# Patient Record
Sex: Female | Born: 1940 | Race: Black or African American | Hispanic: No | Smoking: Former smoker
Health system: Southern US, Community
[De-identification: ages and names within clinical notes are randomized; demographics above are authoritative.]

## PROBLEM LIST (undated history)

## (undated) DIAGNOSIS — Z961 Presence of intraocular lens: Secondary | ICD-10-CM

## (undated) DIAGNOSIS — M810 Age-related osteoporosis without current pathological fracture: Secondary | ICD-10-CM

## (undated) DIAGNOSIS — E079 Disorder of thyroid, unspecified: Secondary | ICD-10-CM

## (undated) DIAGNOSIS — I10 Essential (primary) hypertension: Secondary | ICD-10-CM

## (undated) DIAGNOSIS — E042 Nontoxic multinodular goiter: Secondary | ICD-10-CM

## (undated) DIAGNOSIS — C439 Malignant melanoma of skin, unspecified: Secondary | ICD-10-CM

## (undated) DIAGNOSIS — T8859XA Other complications of anesthesia, initial encounter: Secondary | ICD-10-CM

## (undated) DIAGNOSIS — C8400 Mycosis fungoides, unspecified site: Secondary | ICD-10-CM

## (undated) DIAGNOSIS — T4145XA Adverse effect of unspecified anesthetic, initial encounter: Secondary | ICD-10-CM

## (undated) DIAGNOSIS — C8190 Hodgkin lymphoma, unspecified, unspecified site: Secondary | ICD-10-CM

## (undated) DIAGNOSIS — E119 Type 2 diabetes mellitus without complications: Secondary | ICD-10-CM

## (undated) HISTORY — DX: Nontoxic multinodular goiter: E04.2

## (undated) HISTORY — DX: Hodgkin lymphoma, unspecified, unspecified site: C81.90

## (undated) HISTORY — PX: EYE SURGERY: SHX253

## (undated) HISTORY — DX: Age-related osteoporosis without current pathological fracture: M81.0

## (undated) HISTORY — DX: Presence of intraocular lens: Z96.1

## (undated) HISTORY — PX: BREAST EXCISIONAL BIOPSY: SUR124

## (undated) HISTORY — DX: Malignant melanoma of skin, unspecified: C43.9

## (undated) HISTORY — PX: COLONOSCOPY: SHX174

## (undated) HISTORY — DX: Mycosis fungoides, unspecified site: C84.00

---

## 2012-02-14 DIAGNOSIS — M810 Age-related osteoporosis without current pathological fracture: Secondary | ICD-10-CM | POA: Insufficient documentation

## 2012-02-14 DIAGNOSIS — E1169 Type 2 diabetes mellitus with other specified complication: Secondary | ICD-10-CM | POA: Insufficient documentation

## 2012-03-13 DIAGNOSIS — Z780 Asymptomatic menopausal state: Secondary | ICD-10-CM | POA: Insufficient documentation

## 2013-09-08 LAB — HM DEXA SCAN

## 2014-12-17 ENCOUNTER — Emergency Department (HOSPITAL_COMMUNITY): Payer: Medicare Other

## 2014-12-17 ENCOUNTER — Emergency Department (HOSPITAL_COMMUNITY)
Admission: EM | Admit: 2014-12-17 | Discharge: 2014-12-17 | Disposition: A | Discharge status: Home / Self Care | Payer: Medicare Other | Attending: Emergency Medicine | Admitting: Emergency Medicine

## 2014-12-17 ENCOUNTER — Encounter (HOSPITAL_COMMUNITY): Payer: Self-pay | Admitting: Emergency Medicine

## 2014-12-17 DIAGNOSIS — S42402A Unspecified fracture of lower end of left humerus, initial encounter for closed fracture: Secondary | ICD-10-CM | POA: Insufficient documentation

## 2014-12-17 DIAGNOSIS — Z79899 Other long term (current) drug therapy: Secondary | ICD-10-CM | POA: Diagnosis not present

## 2014-12-17 DIAGNOSIS — Z23 Encounter for immunization: Secondary | ICD-10-CM | POA: Insufficient documentation

## 2014-12-17 DIAGNOSIS — E119 Type 2 diabetes mellitus without complications: Secondary | ICD-10-CM | POA: Insufficient documentation

## 2014-12-17 DIAGNOSIS — Y998 Other external cause status: Secondary | ICD-10-CM | POA: Diagnosis not present

## 2014-12-17 DIAGNOSIS — W19XXXA Unspecified fall, initial encounter: Secondary | ICD-10-CM

## 2014-12-17 DIAGNOSIS — Y9289 Other specified places as the place of occurrence of the external cause: Secondary | ICD-10-CM | POA: Diagnosis not present

## 2014-12-17 DIAGNOSIS — W11XXXA Fall on and from ladder, initial encounter: Secondary | ICD-10-CM | POA: Insufficient documentation

## 2014-12-17 DIAGNOSIS — Y9389 Activity, other specified: Secondary | ICD-10-CM | POA: Diagnosis not present

## 2014-12-17 DIAGNOSIS — I10 Essential (primary) hypertension: Secondary | ICD-10-CM | POA: Insufficient documentation

## 2014-12-17 DIAGNOSIS — S4991XA Unspecified injury of right shoulder and upper arm, initial encounter: Secondary | ICD-10-CM | POA: Diagnosis present

## 2014-12-17 DIAGNOSIS — Z7982 Long term (current) use of aspirin: Secondary | ICD-10-CM | POA: Diagnosis not present

## 2014-12-17 DIAGNOSIS — S5290XA Unspecified fracture of unspecified forearm, initial encounter for closed fracture: Secondary | ICD-10-CM

## 2014-12-17 DIAGNOSIS — S42301A Unspecified fracture of shaft of humerus, right arm, initial encounter for closed fracture: Secondary | ICD-10-CM

## 2014-12-17 HISTORY — DX: Essential (primary) hypertension: I10

## 2014-12-17 HISTORY — DX: Type 2 diabetes mellitus without complications: E11.9

## 2014-12-17 HISTORY — DX: Disorder of thyroid, unspecified: E07.9

## 2014-12-17 LAB — BASIC METABOLIC PANEL
ANION GAP: 11 (ref 5–15)
BUN: 14 mg/dL (ref 6–20)
CALCIUM: 9.1 mg/dL (ref 8.9–10.3)
CHLORIDE: 108 mmol/L (ref 101–111)
CO2: 21 mmol/L — ABNORMAL LOW (ref 22–32)
Creatinine, Ser: 0.82 mg/dL (ref 0.44–1.00)
GFR calc non Af Amer: >60 mL/min (ref >60)
Glucose, Bld: 178 mg/dL — ABNORMAL HIGH (ref 65–99)
Potassium: 3.4 mmol/L — ABNORMAL LOW (ref 3.5–5.1)
SODIUM: 140 mmol/L (ref 135–145)

## 2014-12-17 LAB — CBC
HCT: 38.9 % (ref 36.0–46.0)
HEMOGLOBIN: 12.6 g/dL (ref 12.0–15.0)
MCH: 27.6 pg (ref 26.0–34.0)
MCHC: 32.4 g/dL (ref 30.0–36.0)
MCV: 85.3 fL (ref 78.0–100.0)
PLATELETS: 209 10*3/uL (ref 150–400)
RBC: 4.56 MIL/uL (ref 3.87–5.11)
RDW: 13 % (ref 11.5–15.5)
WBC: 6.9 10*3/uL (ref 4.0–10.5)

## 2014-12-17 MED ORDER — CEFAZOLIN SODIUM 1-5 GM-% IV SOLN
1.0000 g | Freq: Once | INTRAVENOUS | Status: AC
  Administered 2014-12-17: 1 g via INTRAVENOUS
  Filled 2014-12-17: qty 50

## 2014-12-17 MED ORDER — HYDROMORPHONE HCL 1 MG/ML IJ SOLN
1.0000 mg | Freq: Once | INTRAMUSCULAR | Status: AC
  Administered 2014-12-17: 1 mg via INTRAVENOUS
  Filled 2014-12-17: qty 1

## 2014-12-17 MED ORDER — HYDROCODONE-ACETAMINOPHEN 5-325 MG PO TABS: 1.0000 | ORAL_TABLET | ORAL | Status: DC | PRN

## 2014-12-17 MED ORDER — TETANUS-DIPHTH-ACELL PERTUSSIS 5-2.5-18.5 LF-MCG/0.5 IM SUSP
0.5000 mL | Freq: Once | INTRAMUSCULAR | Status: AC
  Administered 2014-12-17: 0.5 mL via INTRAMUSCULAR
  Filled 2014-12-17: qty 0.5

## 2014-12-17 MED ORDER — ONDANSETRON 4 MG PO TBDP
4.0000 mg | ORAL_TABLET | Freq: Once | ORAL | Status: AC
  Administered 2014-12-17: 4 mg via ORAL
  Filled 2014-12-17: qty 1

## 2014-12-17 MED ORDER — ONDANSETRON HCL 4 MG PO TABS: 4.0000 mg | ORAL_TABLET | Freq: Four times a day (QID) | ORAL | Status: DC | PRN

## 2014-12-17 MED ORDER — ONDANSETRON HCL 4 MG/2ML IJ SOLN
4.0000 mg | Freq: Once | INTRAMUSCULAR | Status: AC
  Administered 2014-12-17: 4 mg via INTRAVENOUS
  Filled 2014-12-17: qty 2

## 2014-12-17 MED ORDER — CEPHALEXIN 500 MG PO CAPS: 500.0000 mg | ORAL_CAPSULE | Freq: Four times a day (QID) | ORAL | Status: DC

## 2014-12-17 MED ORDER — LIDOCAINE HCL (PF) 1 % IJ SOLN
30.0000 mL | Freq: Once | INTRAMUSCULAR | Status: AC
  Administered 2014-12-17: 30 mL via INTRADERMAL
  Filled 2014-12-17: qty 30

## 2014-12-17 MED ORDER — HYDROMORPHONE HCL 1 MG/ML IJ SOLN
1.0000 mg | Freq: Once | INTRAMUSCULAR | Status: AC
  Administered 2014-12-17: 1 mg via INTRAMUSCULAR
  Filled 2014-12-17: qty 1

## 2014-12-17 MED ORDER — ONDANSETRON HCL 4 MG/2ML IJ SOLN
4.0000 mg | Freq: Once | INTRAMUSCULAR | Status: AC
Start: 2014-12-17 — End: 2014-12-17
  Administered 2014-12-17: 4 mg via INTRAVENOUS
  Filled 2014-12-17: qty 2

## 2014-12-17 NOTE — ED Notes (Signed)
Dr. Blackman at bedside. 

## 2014-12-17 NOTE — ED Notes (Signed)
Ortho tech paged and OTW

## 2014-12-17 NOTE — ED Notes (Signed)
Family at bedside. Pt continues to be comfortable; no pain

## 2014-12-17 NOTE — ED Notes (Signed)
Pt fell from 10 ft ladder; no LOC; open humerus fx. Witnessed by family; no thinners.

## 2014-12-17 NOTE — ED Notes (Signed)
Pt's splint has blood saturated around elbow. Splint removed and stitched placed by EDP. Ortho called for re splinting.

## 2014-12-17 NOTE — Progress Notes (Signed)
   12/17/14 1000  Clinical Encounter Type  Visited With Health care provider  Visit Type Initial;ED;Trauma   Chaplain was paged to a level 2 trauma page 9:23 PM. Patient sustained injury after falling off of a latter. Chaplain spoke to patient's nurse. Patient's nurse said that family witnessed the fall and would likely be on their way to the hospital. No further chaplain support needed at this time. Page On-call chaplain if patient's family needs support once they arrive.  Ayzia Day, Claudius Sis, Chaplain  10:06 AM

## 2014-12-17 NOTE — ED Notes (Addendum)
Pt's arm is being splinted and pain is intense. Pt vomiting; orders obtained.

## 2014-12-17 NOTE — ED Notes (Signed)
PT ambulated with baseline gait; VSS; A&Ox3; no signs of distress; respirations even and unlabored; skin warm and dry; no questions upon discharge.  

## 2014-12-17 NOTE — ED Provider Notes (Signed)
CSN: [id number]     Arrival date & time 12/17/14  0254 History   First MD Initiated Contact with Patient 12/17/14 [phone]     Chief Complaint  Patient presents with  . Fall     (Consider location/radiation/quality/duration/timing/severity/associated sxs/prior Treatment) HPI Comments: 74 year old female fall 6 feet from ladder landing on right arm. Now complaining of pain to right arm. Has no other complaints. Pain localized to the right upper arm. Sharp stabbing pain. Minimal pain on arrival. Is status Kaylynn Chamblin 100 mcg of fentanyl. No numbness or tingling  Patient is a 74 y.o. female presenting with fall.  Fall This is a new problem. The current episode started today. The problem has been unchanged. Pertinent negatives include no abdominal pain, chest pain, congestion, fever, headaches or rash. Associated symptoms comments: Arm pain . Exacerbated by: movement. She has tried nothing for the symptoms.    Past Medical History  Diagnosis Date  . Hypertension   . Thyroid disease   . Diabetes mellitus without complication    No past surgical history on file. No family history on file. History  Substance Use Topics  . Smoking status: Not on file  . Smokeless tobacco: Not on file  . Alcohol Use: Not on file   OB History    No data available     Review of Systems  Constitutional: Negative for fever.  HENT: Negative for congestion.   Respiratory: Negative for shortness of breath.   Cardiovascular: Negative for chest pain.  Gastrointestinal: Negative for abdominal pain.  Musculoskeletal:       R arm pain   Skin: Negative for rash.  Neurological: Negative for headaches.  Psychiatric/Behavioral: Negative for confusion.  All other systems reviewed and are negative.     Allergies  Review of patient's allergies indicates no known allergies.  Home Medications   Prior to Admission medications   Medication Sig Start Date End Date Taking? Authorizing Provider  aspirin 81 MG tablet  Take 81 mg by mouth daily.   Yes Historical Provider, MD  calcium-vitamin D (OSCAL WITH D) 500-200 MG-UNIT per tablet Take 1 tablet by mouth daily with breakfast.   Yes Historical Provider, MD  HYDROcodone-acetaminophen (NORCO/VICODIN) 5-325 MG per tablet Take 1-2 tablets by mouth every 4 (four) hours as needed. 12/17/14   Robynn Pane, MD  losartan-hydrochlorothiazide (HYZAAR) 50-12.5 MG per tablet Take 1 tablet by mouth daily.   Yes Historical Provider, MD  ondansetron (ZOFRAN) 4 MG tablet Take 1 tablet (4 mg total) by mouth every 6 (six) hours as needed for nausea or vomiting. 12/17/14   Robynn Pane, MD  simvastatin (ZOCOR) 10 MG tablet Take 10 mg by mouth daily.   Yes Historical Provider, MD   BP 117/70 mmHg  Pulse 83  Temp(Src) 97.9 F (36.6 C)  Resp 22  SpO2 97% Physical Exam  Constitutional: She appears well-developed.  HENT:  Head: Normocephalic and atraumatic.  Eyes: Pupils are equal, round, and reactive to light.  Neck:  No midline ttp   Cardiovascular: Normal rate and intact distal pulses.   No murmur heard. Pulmonary/Chest: Effort normal. No respiratory distress.  Abdominal: Soft. She exhibits no distension. There is no tenderness.  Musculoskeletal: She exhibits tenderness.  Right humerus with crepitus and deformity. Neurovascularly intact distally. Small laceration on the posterior side of the humerus. Questionable open fracture  Neurological: She is alert. No cranial nerve deficit. She exhibits normal muscle tone.  Skin: Skin is warm.  Psychiatric: She has a normal mood and affect.  Vitals reviewed.   ED Course  LACERATION REPAIR Date/Time: 12/17/2014 1:57 PM Performed by: Naliyah Neth Authorized by: Jaquarius Seder Consent: Verbal consent obtained. Consent given by: patient Laceration length: 0.5 cm Foreign bodies: no foreign bodies Anesthesia: local infiltration Local anesthetic: lidocaine 1% without epinephrine Anesthetic total: 3 ml Irrigation solution:  saline Skin closure: 4-0 nylon Number of sutures: 1 Technique: simple Approximation: close Approximation difficulty: simple Patient tolerance: Patient tolerated the procedure well with no immediate complications   (including critical care time) Labs Review Labs Reviewed  BASIC METABOLIC PANEL - Abnormal; Notable for the following:    Potassium 3.4 (*)    CO2 21 (*)    Glucose, Bld 178 (*)    All other components within normal limits  CBC    Imaging Review Dg Elbow 2 Views Right  12/17/2014   CLINICAL DATA:  Fell 10 feet from a ladder.  Postreduction films.  EXAM: RIGHT ELBOW - 2 VIEW; RIGHT FOREARM - 2 VIEW  COMPARISON:  Earlier films, same date.  FINDINGS: Again demonstrated is a severely comminuted fracture of the distal humerus along with a radial neck fracture and small avulsion fracture involving the coronoid process. No forearm fractures.  IMPRESSION: Severely comminuted elbow fractures appear stable.   Electronically Signed   By: Marijo Sanes M.D.   On: 12/17/2014 12:29   Dg Forearm Right  12/17/2014   CLINICAL DATA:  [place] 10 feet from a ladder.  Postreduction films.  EXAM: RIGHT ELBOW - 2 VIEW; RIGHT FOREARM - 2 VIEW  COMPARISON:  Earlier films, same date.  FINDINGS: Again demonstrated is a severely comminuted fracture of the distal humerus along with a radial neck fracture and small avulsion fracture involving the coronoid process. No forearm fractures.  IMPRESSION: Severely comminuted elbow fractures appear stable.   Electronically Signed   By: Marijo Sanes M.D.   On: 12/17/2014 12:29   Ct Head Wo Contrast  12/17/2014   CLINICAL DATA:  Fall from ladder, open humerus fracture  EXAM: CT HEAD WITHOUT CONTRAST  CT CERVICAL SPINE WITHOUT CONTRAST  TECHNIQUE: Multidetector CT imaging of the head and cervical spine was performed following the standard protocol without intravenous contrast. Multiplanar CT image reconstructions of the cervical spine were also generated.  COMPARISON:   None.  FINDINGS: CT HEAD FINDINGS  No skull fracture is noted. No intracranial hemorrhage, mass effect or midline shift. Paranasal sinuses and mastoid air cells are unremarkable.  No acute cortical infarction. No mass lesion is noted on this unenhanced scan. Atherosclerotic calcifications of carotid siphon. Mild cerebral atrophy. Mild periventricular white matter decreased attenuation probable due to chronic small vessel ischemic changes.  CT CERVICAL SPINE FINDINGS  Axial images of the cervical spine shows no acute fracture or subluxation. Computer processed images shows no acute fracture or subluxation. Degenerative changes C1-C2 articulation. There is disc space flattening with mild anterior spurring at C5-C6 and C6-C7 level. Mild posterior spurring at C6-C7 level. No prevertebral soft tissue swelling. Cervical airway is patent. Spinal canal is patent. There is no pneumothorax in visualized lung apices.  IMPRESSION: 1. No acute intracranial abnormality.  Mild cerebral atrophy. 2. No cervical spine acute fracture or subluxation. Mild degenerative changes as described above. There is no pneumothorax in visualized lung apices.   Electronically Signed   By: Lahoma Crocker M.D.   On: 12/17/2014 12:32   Ct Cervical Spine Wo Contrast  12/17/2014   CLINICAL DATA:  Fall from ladder, open humerus fracture  EXAM: CT HEAD WITHOUT  CONTRAST  CT CERVICAL SPINE WITHOUT CONTRAST  TECHNIQUE: Multidetector CT imaging of the head and cervical spine was performed following the standard protocol without intravenous contrast. Multiplanar CT image reconstructions of the cervical spine were also generated.  COMPARISON:  None.  FINDINGS: CT HEAD FINDINGS  No skull fracture is noted. No intracranial hemorrhage, mass effect or midline shift. Paranasal sinuses and mastoid air cells are unremarkable.  No acute cortical infarction. No mass lesion is noted on this unenhanced scan. Atherosclerotic calcifications of carotid siphon. Mild cerebral  atrophy. Mild periventricular white matter decreased attenuation probable due to chronic small vessel ischemic changes.  CT CERVICAL SPINE FINDINGS  Axial images of the cervical spine shows no acute fracture or subluxation. Computer processed images shows no acute fracture or subluxation. Degenerative changes C1-C2 articulation. There is disc space flattening with mild anterior spurring at C5-C6 and C6-C7 level. Mild posterior spurring at C6-C7 level. No prevertebral soft tissue swelling. Cervical airway is patent. Spinal canal is patent. There is no pneumothorax in visualized lung apices.  IMPRESSION: 1. No acute intracranial abnormality.  Mild cerebral atrophy. 2. No cervical spine acute fracture or subluxation. Mild degenerative changes as described above. There is no pneumothorax in visualized lung apices.   Electronically Signed   By: Lahoma Crocker M.D.   On: 12/17/2014 12:32   Dg Pelvis Portable  12/17/2014   CLINICAL DATA:  Fall from 10 foot ladder.  EXAM: PORTABLE PELVIS 1-2 VIEWS  COMPARISON:  None.  FINDINGS: Degenerative changes are noted in the lower lumbar spine and at the SI joints bilaterally. No acute fracture is present.  IMPRESSION: Degenerative changes of the lower lumbar spine and pelvis without fracture.   Electronically Signed   By: San Morelle M.D.   On: 12/17/2014 10:19   Ct Elbow Right W/o Cm  12/17/2014   CLINICAL DATA:  Status Kera Deacon 10 foot fall from a ladder with a right elbow fracture. Initial encounter.  EXAM: CT OF THE RIGHT ELBOW WITHOUT CONTRAST  TECHNIQUE: Multidetector CT imaging was performed according to the standard protocol. Multiplanar CT image reconstructions were also generated.  COMPARISON:  Plain films of the right elbow this same day.  FINDINGS: There is a large volume of gas in the soft tissues about the elbow. As seen on the comparison plain films, the patient has a comminuted fracture of the distal humerus. The fracture is predominantly in the shape of a T  with a transverse component through the junction of the metaphysis and diaphysis. The fracture extends inferiorly through the trochlea where there is distraction of approximately 0.5 cm of the articular surface. The medial fracture fragment, which is composed of the medial condyle, is medially displaced approximately 1.7 cm at its superior margin and superiorly displaced 1.0 cm. The lateral condyle is distracted approximately 0.9 cm inferiorly from the diaphysis of the humerus. Medullary bone of the metaphysis of the lateral condyle is highly comminuted. A nondisplaced component of the fracture extends through the lateral epicondyle. A fracture fragment measuring 0.7 cm AP x 1.2 cm craniocaudal x 0.9 cm craniocaudal is displaced into the lateral aspect of the olecranon fossa and rotated medially 180 degrees posteriorly.  The patient also has a fracture of the radial neck which is impacted approximately 0.2 cm and demonstrates minimal anterior displacement. The fracture does not appear to involve the articular surface of the radius. There is also a chip fracture of the medial aspect of the ulna at the sublime tubercle most consistent with avulsion  at the attachment site of the ulnar collateral ligament. No other fracture is identified.  IMPRESSION: Comminuted intra-articular fracture of the distal humerus as described most consistent with an AO type C2 injury.  Mildly displaced fracture of the radial neck does not appear to involve the articular surface of the radius.  Small avulsion fracture of the medial aspect of the ulna is most consistent with avulsion at the ulnar collateral ligament attachment site.  Extensive soft tissue gas worrisome for open fracture.   Electronically Signed   By: Inge Rise M.D.   On: 12/17/2014 13:05   Dg Chest Portable 1 View  12/17/2014   CLINICAL DATA:  Recent fall from ladder  EXAM: PORTABLE CHEST - 1 VIEW  COMPARISON:  None.  FINDINGS: The heart size and mediastinal contours  are within normal limits. Both lungs are clear. The visualized skeletal structures are unremarkable.  IMPRESSION: No active disease.   Electronically Signed   By: Inez Catalina M.D.   On: 12/17/2014 10:18   Dg Humerus Right  12/17/2014   CLINICAL DATA:  Fall 10 feet from ladder with open humeral fracture  EXAM: RIGHT HUMERUS - 2+ VIEW  COMPARISON:  None.  FINDINGS: There is a comminuted fracture of the distal right humerus involving the elbow joint with impaction at the fracture site. Fracture through the radial neck is noted as well with some impaction there is also deformity of the proximal humerus consistent with a impacted surgical neck fracture. Shoulder films may be helpful for further evaluation.  IMPRESSION: Comminuted distal humeral fracture involving the elbow joint with associated radial neck fracture. Impaction at the fracture site is noted.  Irregularity of the proximal humerus consistent with a surgical neck fracture with impaction.   Electronically Signed   By: Inez Catalina M.D.   On: 12/17/2014 10:17     EKG Interpretation None      MDM  74 year old female status Stehanie Ekstrom fall from 5-6 feet. On arrival patient complaining only of pain in the right distal humerus. There is no other complaints of pain on thorough examination. Patient denies striking head. Not on blood thinners. Patient is neurovascularly intact distal to this obvious deformity. Initially basic labs and x-rays were obtained showed a severely comminuted fracture of the distal humerus. Orthopedics was consult. Seen patient in the ED. We'll plan for outpatient management. CT was performed at the request. While in the ED patient also had nausea vomiting require multiple doses of Zofran. This was likely secondary to narcotic pain medication. However given fall and age head and C-spine CTs were obtained, despite the fact patient complained of no headache neck pain, no loss of consciousness and minimal suspicion for head injury. CT is  normal. No indication for further imaging or treatment at this time. Per orthopedics this will be repaired as an outpatient. Was given pain medication as well as Zofran. Discussed return precautions and patient was discharged  1:58 PM Prior discharge patient was having right arm splint redressed. Noted to have moderate amount of blood. Splint was taken down. Patient having bleeding from very small 0.5 cm stellate-like laceration. Same is noted in physical exam section above. No arterial bleed. Slight venous bleeding. One suture was used to approximate the edges. However given the stellate laceration difficult to approximate. Surgicel was then placed. Wound was redressed. Sling and splint were reapplied. Discussed bleeding precautions. Orthopedics is aware of this wound. They recommended Keflex for infection prophylaxis prior to outpatient surgical management in 4 days. Discussed  return precautions with patient and family who voiced understanding.   Final diagnoses:  Fall  Radius fracture  Humeral fracture, right, closed, initial encounter          Robynn Pane, MD 12/17/14 [place], MD 12/17/14 [phone]

## 2014-12-17 NOTE — ED Notes (Signed)
Pt returned and back on monitor; coffee given to family.

## 2014-12-17 NOTE — ED Notes (Signed)
Patient is resting comfortably; family at bedside; denies pain and nausea.

## 2014-12-18 ENCOUNTER — Other Ambulatory Visit (HOSPITAL_COMMUNITY): Payer: Self-pay | Admitting: Orthopaedic Surgery

## 2014-12-18 ENCOUNTER — Encounter (HOSPITAL_COMMUNITY): Payer: Self-pay | Admitting: *Deleted

## 2014-12-18 ENCOUNTER — Encounter (HOSPITAL_COMMUNITY): Payer: Self-pay | Admitting: Pharmacy Technician

## 2014-12-21 MED ORDER — CEFAZOLIN SODIUM-DEXTROSE 2-3 GM-% IV SOLR
2.0000 g | INTRAVENOUS | Status: AC
  Administered 2014-12-22: 2 g via INTRAVENOUS
  Filled 2014-12-21: qty 50

## 2014-12-22 ENCOUNTER — Encounter (HOSPITAL_COMMUNITY)
Admission: RE | Disposition: A | Discharge status: Home / Self Care | Payer: Self-pay | Source: Ambulatory Visit | Attending: Orthopaedic Surgery

## 2014-12-22 ENCOUNTER — Ambulatory Visit (HOSPITAL_COMMUNITY)
Admission: RE | Admit: 2014-12-22 | Discharge: 2014-12-23 | Disposition: A | Discharge status: Home / Self Care | Payer: Medicare Other | Source: Ambulatory Visit | Attending: Orthopaedic Surgery | Admitting: Orthopaedic Surgery

## 2014-12-22 ENCOUNTER — Encounter (HOSPITAL_COMMUNITY): Payer: Self-pay | Admitting: *Deleted

## 2014-12-22 ENCOUNTER — Other Ambulatory Visit: Payer: Self-pay

## 2014-12-22 ENCOUNTER — Observation Stay (HOSPITAL_COMMUNITY): Payer: Medicare Other

## 2014-12-22 ENCOUNTER — Inpatient Hospital Stay (HOSPITAL_COMMUNITY): Payer: Medicare Other | Admitting: Anesthesiology

## 2014-12-22 ENCOUNTER — Inpatient Hospital Stay (HOSPITAL_COMMUNITY): Payer: Medicare Other

## 2014-12-22 DIAGNOSIS — S42411A Displaced simple supracondylar fracture without intercondylar fracture of right humerus, initial encounter for closed fracture: Secondary | ICD-10-CM

## 2014-12-22 DIAGNOSIS — I10 Essential (primary) hypertension: Secondary | ICD-10-CM | POA: Insufficient documentation

## 2014-12-22 DIAGNOSIS — S52124A Nondisplaced fracture of head of right radius, initial encounter for closed fracture: Secondary | ICD-10-CM | POA: Diagnosis not present

## 2014-12-22 DIAGNOSIS — Z7982 Long term (current) use of aspirin: Secondary | ICD-10-CM | POA: Insufficient documentation

## 2014-12-22 DIAGNOSIS — W11XXXA Fall on and from ladder, initial encounter: Secondary | ICD-10-CM | POA: Diagnosis not present

## 2014-12-22 DIAGNOSIS — Z87891 Personal history of nicotine dependence: Secondary | ICD-10-CM | POA: Diagnosis not present

## 2014-12-22 DIAGNOSIS — E119 Type 2 diabetes mellitus without complications: Secondary | ICD-10-CM | POA: Insufficient documentation

## 2014-12-22 DIAGNOSIS — Z419 Encounter for procedure for purposes other than remedying health state, unspecified: Secondary | ICD-10-CM

## 2014-12-22 DIAGNOSIS — S42201A Unspecified fracture of upper end of right humerus, initial encounter for closed fracture: Secondary | ICD-10-CM

## 2014-12-22 DIAGNOSIS — S42421A Displaced comminuted supracondylar fracture without intercondylar fracture of right humerus, initial encounter for closed fracture: Secondary | ICD-10-CM | POA: Diagnosis not present

## 2014-12-22 DIAGNOSIS — E079 Disorder of thyroid, unspecified: Secondary | ICD-10-CM | POA: Insufficient documentation

## 2014-12-22 HISTORY — PX: ORIF HUMERUS FRACTURE: SHX2126

## 2014-12-22 HISTORY — DX: Adverse effect of unspecified anesthetic, initial encounter: T41.45XA

## 2014-12-22 HISTORY — DX: Displaced simple supracondylar fracture without intercondylar fracture of right humerus, initial encounter for closed fracture: S42.411A

## 2014-12-22 HISTORY — DX: Other complications of anesthesia, initial encounter: T88.59XA

## 2014-12-22 LAB — GLUCOSE, CAPILLARY
GLUCOSE-CAPILLARY: 231 mg/dL — AB (ref 65–99)
Glucose-Capillary: 177 mg/dL — ABNORMAL HIGH (ref 65–99)

## 2014-12-22 SURGERY — OPEN REDUCTION INTERNAL FIXATION (ORIF) DISTAL HUMERUS FRACTURE
Anesthesia: Regional | Site: Arm Upper | Laterality: Right

## 2014-12-22 MED ORDER — ASPIRIN EC 81 MG PO TBEC
81.0000 mg | DELAYED_RELEASE_TABLET | Freq: Every day | ORAL | Status: DC
  Administered 2014-12-23: 81 mg via ORAL
  Filled 2014-12-22 (×2): qty 1

## 2014-12-22 MED ORDER — DIPHENHYDRAMINE HCL 12.5 MG/5ML PO ELIX: 12.5000 mg | ORAL_SOLUTION | ORAL | Status: DC | PRN

## 2014-12-22 MED ORDER — BUPIVACAINE-EPINEPHRINE (PF) 0.5% -1:200000 IJ SOLN
INTRAMUSCULAR | Status: DC | PRN
  Administered 2014-12-22: 30 mL via PERINEURAL

## 2014-12-22 MED ORDER — LOSARTAN POTASSIUM 50 MG PO TABS
50.0000 mg | ORAL_TABLET | Freq: Every day | ORAL | Status: DC
  Administered 2014-12-23: 50 mg via ORAL
  Filled 2014-12-22: qty 1

## 2014-12-22 MED ORDER — MIDAZOLAM HCL 2 MG/2ML IJ SOLN
INTRAMUSCULAR | Status: AC
  Administered 2014-12-22: 1 mg via INTRAVENOUS
  Filled 2014-12-22: qty 2

## 2014-12-22 MED ORDER — DEXTROSE 5 % IV SOLN
500.0000 mg | Freq: Four times a day (QID) | INTRAVENOUS | Status: DC | PRN
  Filled 2014-12-22: qty 5

## 2014-12-22 MED ORDER — METHOCARBAMOL 500 MG PO TABS
500.0000 mg | ORAL_TABLET | Freq: Four times a day (QID) | ORAL | Status: DC | PRN
  Administered 2014-12-22 – 2014-12-23 (×3): 500 mg via ORAL
  Filled 2014-12-22 (×4): qty 1

## 2014-12-22 MED ORDER — FENTANYL CITRATE (PF) 100 MCG/2ML IJ SOLN
INTRAMUSCULAR | Status: AC
  Administered 2014-12-22: 50 ug via INTRAVENOUS
  Filled 2014-12-22: qty 2

## 2014-12-22 MED ORDER — PROPOFOL 10 MG/ML IV BOLUS
INTRAVENOUS | Status: DC | PRN
  Administered 2014-12-22: 100 mg via INTRAVENOUS

## 2014-12-22 MED ORDER — OXYCODONE HCL 5 MG PO TABS
5.0000 mg | ORAL_TABLET | ORAL | Status: DC | PRN
  Administered 2014-12-22 – 2014-12-23 (×2): 10 mg via ORAL
  Administered 2014-12-23 (×2): 5 mg via ORAL
  Administered 2014-12-23: 10 mg via ORAL
  Filled 2014-12-22 (×3): qty 2

## 2014-12-22 MED ORDER — ONDANSETRON HCL 4 MG PO TABS: 4.0000 mg | ORAL_TABLET | Freq: Four times a day (QID) | ORAL | Status: DC | PRN

## 2014-12-22 MED ORDER — ONDANSETRON HCL 4 MG/2ML IJ SOLN
INTRAMUSCULAR | Status: AC
  Filled 2014-12-22: qty 2

## 2014-12-22 MED ORDER — DEXAMETHASONE SODIUM PHOSPHATE 4 MG/ML IJ SOLN
INTRAMUSCULAR | Status: DC | PRN
  Administered 2014-12-22: 4 mg via INTRAVENOUS

## 2014-12-22 MED ORDER — FENTANYL CITRATE (PF) 250 MCG/5ML IJ SOLN
INTRAMUSCULAR | Status: AC
  Filled 2014-12-22: qty 5

## 2014-12-22 MED ORDER — FENTANYL CITRATE (PF) 100 MCG/2ML IJ SOLN
50.0000 ug | INTRAMUSCULAR | Status: DC | PRN
  Administered 2014-12-22: 50 ug via INTRAVENOUS

## 2014-12-22 MED ORDER — LACTATED RINGERS IV SOLN
INTRAVENOUS | Status: DC
  Administered 2014-12-22: 13:00:00 via INTRAVENOUS

## 2014-12-22 MED ORDER — HYDROMORPHONE HCL 1 MG/ML IJ SOLN
0.2500 mg | INTRAMUSCULAR | Status: DC | PRN
  Administered 2014-12-22 (×4): 0.5 mg via INTRAVENOUS

## 2014-12-22 MED ORDER — OXYCODONE HCL 5 MG/5ML PO SOLN: 5.0000 mg | Freq: Once | ORAL | Status: DC | PRN

## 2014-12-22 MED ORDER — HYDROMORPHONE HCL 1 MG/ML IJ SOLN
INTRAMUSCULAR | Status: AC
  Filled 2014-12-22: qty 1

## 2014-12-22 MED ORDER — MIDAZOLAM HCL 2 MG/2ML IJ SOLN
INTRAMUSCULAR | Status: AC
  Filled 2014-12-22: qty 2

## 2014-12-22 MED ORDER — PHENYLEPHRINE HCL 10 MG/ML IJ SOLN
INTRAMUSCULAR | Status: DC | PRN
  Administered 2014-12-22 (×3): 80 ug via INTRAVENOUS
  Administered 2014-12-22: 40 ug via INTRAVENOUS
  Administered 2014-12-22 (×4): 80 ug via INTRAVENOUS

## 2014-12-22 MED ORDER — OXYCODONE HCL 5 MG PO TABS: 5.0000 mg | ORAL_TABLET | Freq: Once | ORAL | Status: DC | PRN

## 2014-12-22 MED ORDER — CLINDAMYCIN PHOSPHATE 600 MG/50ML IV SOLN
600.0000 mg | Freq: Four times a day (QID) | INTRAVENOUS | Status: AC
  Administered 2014-12-22 – 2014-12-23 (×3): 600 mg via INTRAVENOUS
  Filled 2014-12-22 (×3): qty 50

## 2014-12-22 MED ORDER — PROPOFOL 10 MG/ML IV BOLUS
INTRAVENOUS | Status: AC
  Filled 2014-12-22: qty 20

## 2014-12-22 MED ORDER — MIDAZOLAM HCL 2 MG/2ML IJ SOLN
1.0000 mg | INTRAMUSCULAR | Status: DC | PRN
  Administered 2014-12-22: 1 mg via INTRAVENOUS

## 2014-12-22 MED ORDER — FENTANYL CITRATE (PF) 100 MCG/2ML IJ SOLN
INTRAMUSCULAR | Status: DC | PRN
  Administered 2014-12-22 (×2): 50 ug via INTRAVENOUS

## 2014-12-22 MED ORDER — METOCLOPRAMIDE HCL 5 MG PO TABS: 5.0000 mg | ORAL_TABLET | Freq: Three times a day (TID) | ORAL | Status: DC | PRN

## 2014-12-22 MED ORDER — METOCLOPRAMIDE HCL 5 MG/ML IJ SOLN: 5.0000 mg | Freq: Three times a day (TID) | INTRAMUSCULAR | Status: DC | PRN

## 2014-12-22 MED ORDER — METHOCARBAMOL 500 MG PO TABS
ORAL_TABLET | ORAL | Status: AC
  Filled 2014-12-22: qty 1

## 2014-12-22 MED ORDER — HYDROMORPHONE HCL 1 MG/ML IJ SOLN
1.0000 mg | INTRAMUSCULAR | Status: DC | PRN
  Filled 2014-12-22: qty 1

## 2014-12-22 MED ORDER — LOSARTAN POTASSIUM-HCTZ 50-12.5 MG PO TABS: 1.0000 | ORAL_TABLET | Freq: Every day | ORAL | Status: DC

## 2014-12-22 MED ORDER — LIDOCAINE HCL (CARDIAC) 20 MG/ML IV SOLN
INTRAVENOUS | Status: AC
  Filled 2014-12-22: qty 5

## 2014-12-22 MED ORDER — PROMETHAZINE HCL 25 MG/ML IJ SOLN: 6.2500 mg | INTRAMUSCULAR | Status: DC | PRN

## 2014-12-22 MED ORDER — HYDROCHLOROTHIAZIDE 12.5 MG PO CAPS
12.5000 mg | ORAL_CAPSULE | Freq: Every day | ORAL | Status: DC
  Administered 2014-12-23: 12.5 mg via ORAL
  Filled 2014-12-22: qty 1

## 2014-12-22 MED ORDER — ACETAMINOPHEN 325 MG PO TABS: 650.0000 mg | ORAL_TABLET | Freq: Four times a day (QID) | ORAL | Status: DC | PRN

## 2014-12-22 MED ORDER — SUCCINYLCHOLINE CHLORIDE 20 MG/ML IJ SOLN
INTRAMUSCULAR | Status: DC | PRN
  Administered 2014-12-22: 100 mg via INTRAVENOUS

## 2014-12-22 MED ORDER — ZOLPIDEM TARTRATE 5 MG PO TABS: 5.0000 mg | ORAL_TABLET | Freq: Every evening | ORAL | Status: DC | PRN

## 2014-12-22 MED ORDER — OXYCODONE HCL 5 MG PO TABS
ORAL_TABLET | ORAL | Status: AC
  Filled 2014-12-22: qty 2

## 2014-12-22 MED ORDER — ACETAMINOPHEN 650 MG RE SUPP: 650.0000 mg | Freq: Four times a day (QID) | RECTAL | Status: DC | PRN

## 2014-12-22 MED ORDER — 0.9 % SODIUM CHLORIDE (POUR BTL) OPTIME
TOPICAL | Status: DC | PRN
  Administered 2014-12-22: 1000 mL

## 2014-12-22 MED ORDER — SODIUM CHLORIDE 0.9 % IV SOLN
INTRAVENOUS | Status: DC
  Administered 2014-12-22: 20:00:00 via INTRAVENOUS

## 2014-12-22 MED ORDER — LACTATED RINGERS IV SOLN
INTRAVENOUS | Status: DC | PRN
  Administered 2014-12-22 (×2): via INTRAVENOUS

## 2014-12-22 MED ORDER — ONDANSETRON HCL 4 MG/2ML IJ SOLN
INTRAMUSCULAR | Status: DC | PRN
  Administered 2014-12-22: 4 mg via INTRAVENOUS

## 2014-12-22 MED ORDER — ONDANSETRON HCL 4 MG/2ML IJ SOLN: 4.0000 mg | Freq: Four times a day (QID) | INTRAMUSCULAR | Status: DC | PRN

## 2014-12-22 MED ORDER — LIDOCAINE HCL (CARDIAC) 20 MG/ML IV SOLN
INTRAVENOUS | Status: DC | PRN
  Administered 2014-12-22: 60 mg via INTRAVENOUS

## 2014-12-22 SURGICAL SUPPLY — 83 items
BANDAGE ELASTIC 3 VELCRO ST LF (GAUZE/BANDAGES/DRESSINGS) ×4 IMPLANT
BANDAGE ELASTIC 4 VELCRO ST LF (GAUZE/BANDAGES/DRESSINGS) ×8 IMPLANT
BENZOIN TINCTURE PRP APPL 2/3 (GAUZE/BANDAGES/DRESSINGS) IMPLANT
BIT DRILL 2.5X2.75 QC CALB (BIT) ×4 IMPLANT
BIT DRILL CALIBRATED 2.7 (BIT) ×3 IMPLANT
BIT DRILL CALIBRATED 2.7MM (BIT) ×1
BLADE SURG 10 STRL SS (BLADE) IMPLANT
BLADE SURG ROTATE 9660 (MISCELLANEOUS) ×4 IMPLANT
BNDG COHESIVE 4X5 TAN STRL (GAUZE/BANDAGES/DRESSINGS) ×4 IMPLANT
BNDG ESMARK 4X9 LF (GAUZE/BANDAGES/DRESSINGS) ×4 IMPLANT
BNDG GAUZE ELAST 4 BULKY (GAUZE/BANDAGES/DRESSINGS) ×4 IMPLANT
CLEANER TIP ELECTROSURG 2X2 (MISCELLANEOUS) ×4 IMPLANT
CLOSURE WOUND 1/2 X4 (GAUZE/BANDAGES/DRESSINGS)
COVER SURGICAL LIGHT HANDLE (MISCELLANEOUS) ×4 IMPLANT
CUFF TOURNIQUET SINGLE 18IN (TOURNIQUET CUFF) ×4 IMPLANT
CUFF TOURNIQUET SINGLE 24IN (TOURNIQUET CUFF) IMPLANT
DRAPE C-ARM 42X72 X-RAY (DRAPES) IMPLANT
DRAPE IMP U-DRAPE 54X76 (DRAPES) ×4 IMPLANT
DRAPE INCISE IOBAN 66X45 STRL (DRAPES) IMPLANT
DRAPE U-SHAPE 47X51 STRL (DRAPES) ×4 IMPLANT
DRSG PAD ABDOMINAL 8X10 ST (GAUZE/BANDAGES/DRESSINGS) ×8 IMPLANT
ELECT REM PT RETURN 9FT ADLT (ELECTROSURGICAL) ×4
ELECTRODE REM PT RTRN 9FT ADLT (ELECTROSURGICAL) ×2 IMPLANT
FACESHIELD WRAPAROUND (MASK) IMPLANT
GAUZE SPONGE 4X4 12PLY STRL (GAUZE/BANDAGES/DRESSINGS) ×4 IMPLANT
GAUZE XEROFORM 5X9 LF (GAUZE/BANDAGES/DRESSINGS) ×4 IMPLANT
GLOVE BIO SURGEON STRL SZ8 (GLOVE) ×4 IMPLANT
GLOVE BIOGEL PI IND STRL 8 (GLOVE) ×4 IMPLANT
GLOVE BIOGEL PI INDICATOR 8 (GLOVE) ×4
GLOVE ORTHO TXT STRL SZ7.5 (GLOVE) ×4 IMPLANT
GLOVE SURG ORTHO 8.0 STRL STRW (GLOVE) ×4 IMPLANT
GOWN STRL REUS W/ TWL LRG LVL3 (GOWN DISPOSABLE) ×4 IMPLANT
GOWN STRL REUS W/ TWL XL LVL3 (GOWN DISPOSABLE) ×4 IMPLANT
GOWN STRL REUS W/TWL LRG LVL3 (GOWN DISPOSABLE) ×4
GOWN STRL REUS W/TWL XL LVL3 (GOWN DISPOSABLE) ×4
KIT BASIN OR (CUSTOM PROCEDURE TRAY) ×4 IMPLANT
KIT ROOM TURNOVER OR (KITS) ×4 IMPLANT
LOOP VESSEL MAXI BLUE (MISCELLANEOUS) ×4 IMPLANT
MANIFOLD NEPTUNE II (INSTRUMENTS) ×4 IMPLANT
NS IRRIG 1000ML POUR BTL (IV SOLUTION) ×4 IMPLANT
PACK ORTHO EXTREMITY (CUSTOM PROCEDURE TRAY) ×4 IMPLANT
PACK UNIVERSAL I (CUSTOM PROCEDURE TRAY) ×4 IMPLANT
PAD ARMBOARD 7.5X6 YLW CONV (MISCELLANEOUS) ×8 IMPLANT
PAD CAST 4YDX4 CTTN HI CHSV (CAST SUPPLIES) ×6 IMPLANT
PADDING CAST COTTON 4X4 STRL (CAST SUPPLIES) ×6
PLATE LOCK R LG 97X10.9X2.5X10 (Plate) ×2 IMPLANT
PLATE LOCK RT LRG (Plate) ×2 IMPLANT
PLATE LOCK TR LRG (Plate) ×4 IMPLANT
SCREW CORT T15 24X3.5XST LCK (Screw) ×2 IMPLANT
SCREW CORT T15 TPR 55X3.5XST (Screw) ×2 IMPLANT
SCREW CORTICAL 3.5X24MM (Screw) ×2 IMPLANT
SCREW CORTICAL 3.5X55MM (Screw) ×2 IMPLANT
SCREW CORTICAL LOW PROF 3.5X20 (Screw) ×8 IMPLANT
SCREW LOCK 3.5X50 DIST TIB (Screw) ×4 IMPLANT
SCREW LOCK CORT STAR 3.5X14 (Screw) ×8 IMPLANT
SCREW LOCK CORT STAR 3.5X16 (Screw) ×4 IMPLANT
SCREW LOCK CORT STAR 3.5X20 (Screw) ×4 IMPLANT
SCREW LOW PROFILE 18MMX3.5MM (Screw) ×8 IMPLANT
SCREW LOW PROFILE 22MMX3.5MM (Screw) ×12 IMPLANT
SLING ARM FOAM STRAP MED (SOFTGOODS) ×4 IMPLANT
SPLINT PLASTER CAST XFAST 5X30 (CAST SUPPLIES) ×4 IMPLANT
SPLINT PLASTER XFAST SET 5X30 (CAST SUPPLIES) ×4
SPONGE GAUZE 4X4 12PLY STER LF (GAUZE/BANDAGES/DRESSINGS) ×4 IMPLANT
SPONGE LAP 18X18 X RAY DECT (DISPOSABLE) ×12 IMPLANT
STAPLER VISISTAT 35W (STAPLE) ×4 IMPLANT
STRIP CLOSURE SKIN 1/2X4 (GAUZE/BANDAGES/DRESSINGS) IMPLANT
SUCTION FRAZIER TIP 10 FR DISP (SUCTIONS) ×4 IMPLANT
SUT ETHILON 2 0 FS 18 (SUTURE) ×20 IMPLANT
SUT PROLENE 3 0 PS 2 (SUTURE) ×8 IMPLANT
SUT VIC AB 0 CT1 27 (SUTURE) ×4
SUT VIC AB 0 CT1 27XBRD ANBCTR (SUTURE) ×4 IMPLANT
SUT VIC AB 2-0 CT1 27 (SUTURE) ×4
SUT VIC AB 2-0 CT1 TAPERPNT 27 (SUTURE) ×4 IMPLANT
SYR CONTROL 10ML LL (SYRINGE) ×4 IMPLANT
TOWEL OR 17X24 6PK STRL BLUE (TOWEL DISPOSABLE) IMPLANT
TOWEL OR 17X26 10 PK STRL BLUE (TOWEL DISPOSABLE) ×4 IMPLANT
TUBE CONNECTING 12'X1/4 (SUCTIONS) ×1
TUBE CONNECTING 12X1/4 (SUCTIONS) ×3 IMPLANT
UNDERPAD 30X30 INCONTINENT (UNDERPADS AND DIAPERS) ×4 IMPLANT
WASHER 3.5MM (Orthopedic Implant) ×24 IMPLANT
WATER STERILE IRR 1000ML POUR (IV SOLUTION) ×4 IMPLANT
WIRE K 1.6MM 144256 (MISCELLANEOUS) ×16 IMPLANT
YANKAUER SUCT BULB TIP NO VENT (SUCTIONS) ×4 IMPLANT

## 2014-12-22 NOTE — Anesthesia Postprocedure Evaluation (Signed)
  Anesthesia Post-op Note  Patient: Katherine Barnett  Procedure(s) Performed: Procedure(s): OPEN REDUCTION INTERNAL FIXATION (ORIF) RIGHT DISTAL HUMERUS FRACTURE (Right)  Patient Location: PACU  Anesthesia Type:General  Level of Consciousness: awake  Airway and Oxygen Therapy: Patient Spontanous Breathing  Post-op Pain: mild  Post-op Assessment: Post-op Vital signs reviewed  Post-op Vital Signs: Reviewed  Last Vitals:  Filed Vitals:   12/22/14 1836  BP:   Pulse: 126  Temp:   Resp: 19    Complications: No apparent anesthesia complications

## 2014-12-22 NOTE — Anesthesia Preprocedure Evaluation (Addendum)
Anesthesia Evaluation  Patient identified by MRN, date of birth, ID band Patient awake    Reviewed: Allergy & Precautions, NPO status , Patient's Chart, lab work & pertinent test results  Airway Mallampati: II  TM Distance: >3 FB Neck ROM: Full    Dental   Pulmonary former smoker,  breath sounds clear to auscultation        Cardiovascular hypertension, Pt. on medications Rhythm:Regular Rate:Normal     Neuro/Psych negative neurological ROS     GI/Hepatic negative GI ROS, Neg liver ROS,   Endo/Other  diabetes, Well Controlled  Renal/GU negative Renal ROS     Musculoskeletal   Abdominal   Peds  Hematology negative hematology ROS (+)   Anesthesia Other Findings   Reproductive/Obstetrics                            Anesthesia Physical Anesthesia Plan  ASA: II  Anesthesia Plan: General and Regional   Post-op Pain Management:    Induction: Intravenous  Airway Management Planned: LMA and Oral ETT  Additional Equipment:   Intra-op Plan:   Post-operative Plan: Extubation in OR  Informed Consent: I have reviewed the patients History and Physical, chart, labs and discussed the procedure including the risks, benefits and alternatives for the proposed anesthesia with the patient or authorized representative who has indicated his/her understanding and acceptance.   Dental advisory given  Plan Discussed with: CRNA  Anesthesia Plan Comments:         Anesthesia Quick Evaluation

## 2014-12-22 NOTE — H&P (Signed)
Katherine Barnett is an 74 y.o. female.   Chief Complaint:   Right elbow pain with known unstable fracture HPI:   74 yo female who fell off of a step ladder last week landing directly on her right elbow.  Was seen in the ED and found to have an unstable right distal humerus supracondylar fracture with intracondylar extension.  Also found to have a radial head/neck fracture.  She was placed in a splint in the ED and now presents for definitive fixation of this unstable injury.  She understand the risks of infection, nerve and vessel injury, non-union, and post-traumatic arthritis.  Past Medical History  Diagnosis Date  . Hypertension   . Thyroid disease     pt denies this (12/18/14)  . Diabetes mellitus without complication     type 2 - diet controlled  . Complication of anesthesia     vomited after colonoscopy    Past Surgical History  Procedure Laterality Date  . Eye surgery Bilateral     cataract surgery with lens implant  . Colonoscopy      History reviewed. No pertinent family history. Social History:  reports that she quit smoking about 30 years ago. She has never used smokeless tobacco. She reports that she does not drink alcohol or use illicit drugs.  Allergies: No Known Allergies  No prescriptions prior to admission    No results found for this or any previous visit (from the past 48 hour(s)). No results found.  Review of Systems  All other systems reviewed and are negative.   There were no vitals taken for this visit. Physical Exam  Constitutional: She is oriented to person, place, and time. She appears well-developed and well-nourished.  HENT:  Head: Normocephalic and atraumatic.  Eyes: EOM are normal. Pupils are equal, round, and reactive to light.  Neck: Normal range of motion. Neck supple.  Cardiovascular: Normal rate and regular rhythm.   Respiratory: Effort normal and breath sounds normal.  GI: Soft. Bowel sounds are normal.  Musculoskeletal:       Right  elbow: She exhibits decreased range of motion, swelling, effusion and deformity. Tenderness found. Radial head, medial epicondyle and lateral epicondyle tenderness noted.  Neurological: She is alert and oriented to person, place, and time.  Skin: Skin is warm and dry.  Psychiatric: She has a normal mood and affect.     Assessment/Plan Right closed supracondylar humerus fracture with intracondylar extension as well as a radial head/neck fracture 1)  To the OR today for open reduction/internal fixation of this unstable right elbow fracture  Katherine Barnett Y 12/22/2014, 12:05 PM

## 2014-12-22 NOTE — Anesthesia Procedure Notes (Addendum)
Anesthesia Regional Block:  Supraclavicular block  Pre-Anesthetic Checklist: ,, timeout performed, Correct Patient, Correct Site, Correct Laterality, Correct Procedure, Correct Position, site marked, Risks and benefits discussed,  Surgical consent,  Pre-op evaluation,  At surgeon's request and post-op pain management  Laterality: Right  Prep: chloraprep       Needles:  Injection technique: Single-shot  Needle Type: Echogenic Stimulator Needle     Needle Length: 9cm 9 cm Needle Gauge: 21 and 21 G    Additional Needles:  Procedures: ultrasound guided (picture in chart) and nerve stimulator Supraclavicular block  Nerve Stimulator or Paresthesia:  Response: biceps, 0.4 mA,   Additional Responses:   Narrative:  Start time: 12/22/2014 1:40 PM End time: 12/22/2014 1:50 PM Injection made incrementally with aspirations every 5 mL.  Events: blood aspirated  Performed by: Personally  Anesthesiologist: Suzette Battiest  Additional Notes: Risks, benefits and alternative to block explained extensively.  Patient tolerated procedure well, without complications.   Procedure Name: LMA Insertion Date/Time: 12/22/2014 3:06 PM Performed by: Ollen Bowl Pre-anesthesia Checklist: Patient identified, Emergency Drugs available, Suction available, Patient being monitored and Timeout performed Patient Re-evaluated:Patient Re-evaluated prior to inductionOxygen Delivery Method: Circle system utilized and Simple face mask Preoxygenation: Pre-oxygenation with 100% oxygen Intubation Type: IV induction Ventilation: Mask ventilation without difficulty LMA: LMA inserted LMA Size: 4.0 Number of attempts: 1 Airway Equipment and Method: Patient positioned with wedge pillow Placement Confirmation: positive ETCO2 and breath sounds checked- equal and bilateral Tube secured with: Tape Dental Injury: Teeth and Oropharynx as per pre-operative assessment     Procedure Name:  Intubation Date/Time: 12/22/2014 3:11 PM Performed by: Ollen Bowl Pre-anesthesia Checklist: Patient identified, Emergency Drugs available, Suction available, Patient being monitored and Timeout performed Patient Re-evaluated:Patient Re-evaluated prior to inductionOxygen Delivery Method: Circle system utilized and Simple face mask Preoxygenation: Pre-oxygenation with 100% oxygen Intubation Type: Combination inhalational/ intravenous induction Ventilation: Mask ventilation without difficulty Laryngoscope Size: Mac and 3 Grade View: Grade III Tube type: Oral Tube size: 7.5 mm Number of attempts: 1 Airway Equipment and Method: Patient positioned with wedge pillow and Stylet Placement Confirmation: positive ETCO2 and breath sounds checked- equal and bilateral Secured at: 21 cm Tube secured with: Tape Dental Injury: Teeth and Oropharynx as per pre-operative assessment  Difficulty Due To: Difficult Airway- due to anterior larynx Future Recommendations: Recommend- induction with short-acting agent, and alternative techniques readily available

## 2014-12-22 NOTE — Transfer of Care (Signed)
Immediate Anesthesia Transfer of Care Note  Patient: Katherine Barnett  Procedure(s) Performed: Procedure(s): OPEN REDUCTION INTERNAL FIXATION (ORIF) RIGHT DISTAL HUMERUS FRACTURE (Right)  Patient Location: PACU  Anesthesia Type:GA combined with regional for post-op pain  Level of Consciousness: awake and alert   Airway & Oxygen Therapy: Patient Spontanous Breathing and Patient connected to nasal cannula oxygen  Post-op Assessment: Report given to RN, Post -op Vital signs reviewed and stable and Patient moving all extremities X 4  Post vital signs: Reviewed and stable  Last Vitals:  Filed Vitals:   12/22/14 1400  BP: 118/63  Pulse: 125  Temp:   Resp: 24    Complications: No apparent anesthesia complications

## 2014-12-22 NOTE — Brief Op Note (Signed)
12/22/2014  6:18 PM  PATIENT:  Katherine Barnett  74 y.o. female  PRE-OPERATIVE DIAGNOSIS:  comminuted right distal humerus fracture, right radial head fracture  POST-OPERATIVE DIAGNOSIS:  comminuted right distal humerus fracture, right radial head fracture  PROCEDURE:  Procedure(s): OPEN REDUCTION INTERNAL FIXATION (ORIF) RIGHT DISTAL HUMERUS FRACTURE (Right)  SURGEON:  Surgeon(s) and Role:    * Mcarthur Rossetti, MD - Primary    * Naiping Ephriam Jenkins, MD - Assisting  PHYSICIAN ASSISTANT: Benita Stabile, PA-C  ANESTHESIA:   general  EBL:  Total I/O In: 1000 [I.V.:1000] Out: 100 [Blood:100]  BLOOD ADMINISTERED:none  DRAINS: none   LOCAL MEDICATIONS USED:  NONE  SPECIMEN:  No Specimen  DISPOSITION OF SPECIMEN:  N/A  COUNTS:  YES  TOURNIQUET:   Total Tourniquet Time Documented: Upper Arm (Right) - 116 minutes Total: Upper Arm (Right) - 116 minutes   DICTATION: .Other Dictation: Dictation Number [id number]  PLAN OF CARE: Admit to inpatient   PATIENT DISPOSITION:  PACU - hemodynamically stable.   Delay start of Pharmacological VTE agent (>24hrs) due to surgical blood loss or risk of bleeding: not applicable

## 2014-12-23 ENCOUNTER — Encounter (HOSPITAL_COMMUNITY): Payer: Self-pay | Admitting: Orthopaedic Surgery

## 2014-12-23 DIAGNOSIS — S42421A Displaced comminuted supracondylar fracture without intercondylar fracture of right humerus, initial encounter for closed fracture: Secondary | ICD-10-CM | POA: Diagnosis not present

## 2014-12-23 LAB — GLUCOSE, CAPILLARY: Glucose-Capillary: 239 mg/dL — ABNORMAL HIGH (ref 65–99)

## 2014-12-23 MED ORDER — HYDROCODONE-ACETAMINOPHEN 5-325 MG PO TABS: 1.0000 | ORAL_TABLET | ORAL | Status: DC | PRN

## 2014-12-23 MED ORDER — TIZANIDINE HCL 4 MG PO TABS: 4.0000 mg | ORAL_TABLET | Freq: Four times a day (QID) | ORAL | Status: DC | PRN

## 2014-12-23 NOTE — Progress Notes (Signed)
Inpatient Diabetes Program Recommendations  AACE/ADA: New Consensus Statement on Inpatient Glycemic Control (2013)  Target Ranges:  Prepandial:   less than 140 mg/dL      Peak postprandial:   less than 180 mg/dL (1-2 hours)      Critically ill patients:  140 - 180 mg/dL   Inpatient Diabetes Program Recommendations Correction (SSI): add Novolog sensitive scale Q4 during steroid therapy Thank you  Raoul Pitch BSN, RN,CDE Inpatient Diabetes Coordinator [phone] (team pager)

## 2014-12-23 NOTE — Progress Notes (Signed)
Pt had a history of DM without  Complication. On this admission, pt blood sugar has been above 230 since last night. There is no meds coverage for DM. Call Dr. Ninfa Linden to be aware. Gave pt education about follow up with family MD for her condition per Dr. Trevor Mace verbal instruction. Pt states understanding and will follow up with her Primary MD.

## 2014-12-23 NOTE — Discharge Instructions (Signed)
Keep your splint clean and dry. Expect hand swelling. Do wear your sling at most times. You do have a right shoulder fracture as well, so no lifting of your right arm. Call our office ([facility]) to be seen in 2 weeks.

## 2014-12-23 NOTE — Progress Notes (Signed)
Subjective: 1 Day Post-Op Procedure(s) (LRB): OPEN REDUCTION INTERNAL FIXATION (ORIF) RIGHT DISTAL HUMERUS FRACTURE (Right) Patient reports pain as moderate.    Objective: Vital signs in last 24 hours: Temp:  [98.3 F (36.8 C)-98.9 F (37.2 C)] 98.3 F (36.8 C) (06/01 0546) Pulse Rate:  [96-132] 126 (06/01 0546) Resp:  [15-25] 20 (06/01 0546) BP: (101-141)/(57-86) 123/65 mmHg (06/01 0546) SpO2:  [94 %-100 %] 98 % (06/01 0546) Weight:  [63.504 kg (140 lb)] 63.504 kg (140 lb) (05/31 1253)  Intake/Output from previous day: 05/31 0701 - 06/01 0700 In: 1350 [P.O.:50; I.V.:1300] Out: 100 [Blood:100] Intake/Output this shift:    No results for input(s): HGB in the last 72 hours. No results for input(s): WBC, RBC, HCT, PLT in the last 72 hours. No results for input(s): NA, K, CL, CO2, BUN, CREATININE, GLUCOSE, CALCIUM in the last 72 hours. No results for input(s): LABPT, INR in the last 72 hours.  Sensation intact distally Intact pulses distally Incision: dressing C/D/I  Assessment/Plan: 1 Day Post-Op Procedure(s) (LRB): OPEN REDUCTION INTERNAL FIXATION (ORIF) RIGHT DISTAL HUMERUS FRACTURE (Right) Can discharge to home today.  Ersie Savino Y 12/23/2014, 7:11 AM

## 2014-12-23 NOTE — Discharge Summary (Signed)
Patient ID: Katherine Barnett MRN: [id number] DOB/AGE: 10-30-1940 74 y.o.  Admit date: 12/22/2014 Discharge date: 12/23/2014  Admission Diagnoses:  Principal Problem:   Right supracondylar humerus fracture; right radial head/neck fracture   Discharge Diagnoses:  Same  Past Medical History  Diagnosis Date  . Hypertension   . Thyroid disease     pt denies this (12/18/14)  . Diabetes mellitus without complication     type 2 - diet controlled  . Complication of anesthesia     vomited after colonoscopy    Surgeries: Procedure(s): OPEN REDUCTION INTERNAL FIXATION (ORIF) RIGHT DISTAL HUMERUS FRACTURE on 12/22/2014   Consultants:    Discharged Condition: Improved  Hospital Course: Katherine Barnett is an 74 y.o. female who was admitted 12/22/2014 for operative treatment ofRight supracondylar humerus fracture. Patient has severe unremitting pain that affects sleep, daily activities, and work/hobbies. After pre-op clearance the patient was taken to the operating room on 12/22/2014 and underwent  Procedure(s): OPEN REDUCTION INTERNAL FIXATION (ORIF) RIGHT DISTAL HUMERUS FRACTURE.    Patient was given perioperative antibiotics: Anti-infectives    Start     Dose/Rate Route Frequency Ordered Stop   12/22/14 2200  clindamycin (CLEOCIN) IVPB 600 mg     600 mg 100 mL/hr over 30 Minutes Intravenous Every 6 hours 12/22/14 2026 12/23/14 1559   12/22/14 1415  ceFAZolin (ANCEF) IVPB 2 g/50 mL premix     2 g 100 mL/hr over 30 Minutes Intravenous To Surgery 12/21/14 0906 12/22/14 1513       Patient was given sequential compression devices, early ambulation, and chemoprophylaxis to prevent DVT.  Patient benefited maximally from hospital stay and there were no complications.    Recent vital signs: Patient Vitals for the past 24 hrs:  BP Temp Temp src Pulse Resp SpO2 Height Weight  12/23/14 0546 123/65 mmHg 98.3 F (36.8 C) - (!) 126 20 98 % - -  12/23/14 0145 (!) 101/59 mmHg 98.7 F (37.1 C) -  (!) 113 20 100 % - -  12/22/14 2030 105/74 mmHg 98.9 F (37.2 C) Oral (!) 121 20 100 % - -  12/22/14 2000 - - - (!) 118 19 98 % - -  12/22/14 1956 - 98.8 F (37.1 C) - - - - - -  12/22/14 1947 117/73 mmHg - - - - - - -  12/22/14 1940 - - - (!) 117 15 97 % - -  12/22/14 1932 122/70 mmHg - - - - - - -  12/22/14 1923 - - - (!) 125 15 94 % - -  12/22/14 1918 129/71 mmHg - - - - - - -  12/22/14 1915 - - - (!) 125 18 98 % - -  12/22/14 1914 - - - (!) 122 17 97 % - -  12/22/14 1902 (!) 141/80 mmHg - - - - - - -  12/22/14 1854 - - - (!) 124 20 97 % - -  12/22/14 1847 139/85 mmHg - - - - - - -  12/22/14 1836 - - - (!) 126 19 97 % - -  12/22/14 1832 (!) 141/62 mmHg - - - - - - -  12/22/14 1830 (!) 141/86 mmHg - - (!) 132 (!) 23 99 % - -  12/22/14 1828 - 98.7 F (37.1 C) - - - - - -  12/22/14 1400 118/63 mmHg - - (!) 125 (!) 24 99 % - -  12/22/14 1355 121/63 mmHg - - (!) 116 (!) 25 99 % - -  01-13-2015 1350 128/75 mmHg - - (!) 102 19 99 % - -  2015/01/13 1345 118/71 mmHg - - (!) 105 19 98 % - -  2015-01-13 1340 (!) 110/57 mmHg - - 96 15 100 % - -  01/13/15 1336 - - - - 20 - - -  2015-01-13 1253 107/60 mmHg 98.5 F (36.9 C) Oral (!) 105 20 98 % 4\' 11"  (1.499 m) 63.504 kg (140 lb)  13-Jan-2015 1246 - - - - - - 4\' 11"  (1.499 m) 63.504 kg (140 lb)     Recent laboratory studies: No results for input(s): WBC, HGB, HCT, PLT, NA, K, CL, CO2, BUN, CREATININE, GLUCOSE, INR, CALCIUM in the last 72 hours.  Invalid input(s): PT, 2   Discharge Medications:     Medication List    STOP taking these medications        cephALEXin 500 MG capsule  Commonly known as:  KEFLEX      TAKE these medications        aspirin 81 MG tablet  Take 81 mg by mouth daily.     calcium-vitamin D 500-200 MG-UNIT per tablet  Commonly known as:  OSCAL WITH D  Take 1 tablet by mouth daily with breakfast.     HYDROcodone-acetaminophen 5-325 MG per tablet  Commonly known as:  NORCO/VICODIN  Take 1-2 tablets by mouth every 4  (four) hours as needed.     losartan-hydrochlorothiazide 50-12.5 MG per tablet  Commonly known as:  HYZAAR  Take 1 tablet by mouth daily.     ondansetron 4 MG tablet  Commonly known as:  ZOFRAN  Take 1 tablet (4 mg total) by mouth every 6 (six) hours as needed for nausea or vomiting.     simvastatin 10 MG tablet  Commonly known as:  ZOCOR  Take 10 mg by mouth at bedtime.     tiZANidine 4 MG tablet  Commonly known as:  ZANAFLEX  Take 1 tablet (4 mg total) by mouth every 6 (six) hours as needed for muscle spasms.        Diagnostic Studies: Dg Elbow 2 Views Right  01-13-2015   CLINICAL DATA:  Known right elbow fracture  EXAM: RIGHT ELBOW - 2 VIEW  COMPARISON:  12/17/2014  FINDINGS: Multiple fixation screws and fixation sideplate are noted with near anatomic alignment of the fracture fragments in the distal humerus. The impacted radial neck fracture is again noted.  IMPRESSION: Status post ORIF of distal humeral fracture.   Electronically Signed   By: Inez Catalina M.D.   On: 01-13-2015 19:35   Dg Elbow 2 Views Right  01-13-15   CLINICAL DATA:  ORIF severely comminuted fracture of the distal right humerus secondary to a 10 foot fall from a ladder 12/17/2014.  EXAM: OPERATIVE RIGHT ELBOW - 2 VIEW  COMPARISON:  Right elbow x-rays 12/17/2014 and right elbow CT of that same date.  FINDINGS: Two spot images from the C-arm fluoroscopic device, AP and lateral views of the right elbow are submitted for interpretation postoperatively. These demonstrate plate and screw fixation of the severely comminuted intra-articular fracture involving the distal humerus. Alignment appears anatomic. The radial neck fracture is less well visualized currently.  The radiologic technologist documented 30 sec of fluoroscopy time.  IMPRESSION: Anatomic alignment post ORIF of the severely comminuted intra-articular distal humerus fracture.   Electronically Signed   By: Evangeline Dakin M.D.   On: 2015-01-13 18:02   Dg  Elbow 2 Views Right  12/17/2014  CLINICAL DATA:  Fell 10 feet from a ladder.  Postreduction films.  EXAM: RIGHT ELBOW - 2 VIEW; RIGHT FOREARM - 2 VIEW  COMPARISON:  Earlier films, same date.  FINDINGS: Again demonstrated is a severely comminuted fracture of the distal humerus along with a radial neck fracture and small avulsion fracture involving the coronoid process. No forearm fractures.  IMPRESSION: Severely comminuted elbow fractures appear stable.   Electronically Signed   By: Marijo Sanes M.D.   On: 12/17/2014 12:29   Dg Forearm Right  12/17/2014   CLINICAL DATA:  [place] 10 feet from a ladder.  Postreduction films.  EXAM: RIGHT ELBOW - 2 VIEW; RIGHT FOREARM - 2 VIEW  COMPARISON:  Earlier films, same date.  FINDINGS: Again demonstrated is a severely comminuted fracture of the distal humerus along with a radial neck fracture and small avulsion fracture involving the coronoid process. No forearm fractures.  IMPRESSION: Severely comminuted elbow fractures appear stable.   Electronically Signed   By: Marijo Sanes M.D.   On: 12/17/2014 12:29   Ct Head Wo Contrast  12/17/2014   CLINICAL DATA:  Fall from ladder, open humerus fracture  EXAM: CT HEAD WITHOUT CONTRAST  CT CERVICAL SPINE WITHOUT CONTRAST  TECHNIQUE: Multidetector CT imaging of the head and cervical spine was performed following the standard protocol without intravenous contrast. Multiplanar CT image reconstructions of the cervical spine were also generated.  COMPARISON:  None.  FINDINGS: CT HEAD FINDINGS  No skull fracture is noted. No intracranial hemorrhage, mass effect or midline shift. Paranasal sinuses and mastoid air cells are unremarkable.  No acute cortical infarction. No mass lesion is noted on this unenhanced scan. Atherosclerotic calcifications of carotid siphon. Mild cerebral atrophy. Mild periventricular white matter decreased attenuation probable due to chronic small vessel ischemic changes.  CT CERVICAL SPINE FINDINGS  Axial images  of the cervical spine shows no acute fracture or subluxation. Computer processed images shows no acute fracture or subluxation. Degenerative changes C1-C2 articulation. There is disc space flattening with mild anterior spurring at C5-C6 and C6-C7 level. Mild posterior spurring at C6-C7 level. No prevertebral soft tissue swelling. Cervical airway is patent. Spinal canal is patent. There is no pneumothorax in visualized lung apices.  IMPRESSION: 1. No acute intracranial abnormality.  Mild cerebral atrophy. 2. No cervical spine acute fracture or subluxation. Mild degenerative changes as described above. There is no pneumothorax in visualized lung apices.   Electronically Signed   By: Lahoma Crocker M.D.   On: 12/17/2014 12:32   Ct Cervical Spine Wo Contrast  12/17/2014   CLINICAL DATA:  Fall from ladder, open humerus fracture  EXAM: CT HEAD WITHOUT CONTRAST  CT CERVICAL SPINE WITHOUT CONTRAST  TECHNIQUE: Multidetector CT imaging of the head and cervical spine was performed following the standard protocol without intravenous contrast. Multiplanar CT image reconstructions of the cervical spine were also generated.  COMPARISON:  None.  FINDINGS: CT HEAD FINDINGS  No skull fracture is noted. No intracranial hemorrhage, mass effect or midline shift. Paranasal sinuses and mastoid air cells are unremarkable.  No acute cortical infarction. No mass lesion is noted on this unenhanced scan. Atherosclerotic calcifications of carotid siphon. Mild cerebral atrophy. Mild periventricular white matter decreased attenuation probable due to chronic small vessel ischemic changes.  CT CERVICAL SPINE FINDINGS  Axial images of the cervical spine shows no acute fracture or subluxation. Computer processed images shows no acute fracture or subluxation. Degenerative changes C1-C2 articulation. There is disc space flattening with mild anterior spurring  at C5-C6 and C6-C7 level. Mild posterior spurring at C6-C7 level. No prevertebral soft tissue  swelling. Cervical airway is patent. Spinal canal is patent. There is no pneumothorax in visualized lung apices.  IMPRESSION: 1. No acute intracranial abnormality.  Mild cerebral atrophy. 2. No cervical spine acute fracture or subluxation. Mild degenerative changes as described above. There is no pneumothorax in visualized lung apices.   Electronically Signed   By: Lahoma Crocker M.D.   On: 12/17/2014 12:32   Dg Pelvis Portable  12/17/2014   CLINICAL DATA:  Fall from 10 foot ladder.  EXAM: PORTABLE PELVIS 1-2 VIEWS  COMPARISON:  None.  FINDINGS: Degenerative changes are noted in the lower lumbar spine and at the SI joints bilaterally. No acute fracture is present.  IMPRESSION: Degenerative changes of the lower lumbar spine and pelvis without fracture.   Electronically Signed   By: San Morelle M.D.   On: 12/17/2014 10:19   Ct Elbow Right W/o Cm  12/17/2014   CLINICAL DATA:  Status post 10 foot fall from a ladder with a right elbow fracture. Initial encounter.  EXAM: CT OF THE RIGHT ELBOW WITHOUT CONTRAST  TECHNIQUE: Multidetector CT imaging was performed according to the standard protocol. Multiplanar CT image reconstructions were also generated.  COMPARISON:  Plain films of the right elbow this same day.  FINDINGS: There is a large volume of gas in the soft tissues about the elbow. As seen on the comparison plain films, the patient has a comminuted fracture of the distal humerus. The fracture is predominantly in the shape of a T with a transverse component through the junction of the metaphysis and diaphysis. The fracture extends inferiorly through the trochlea where there is distraction of approximately 0.5 cm of the articular surface. The medial fracture fragment, which is composed of the medial condyle, is medially displaced approximately 1.7 cm at its superior margin and superiorly displaced 1.0 cm. The lateral condyle is distracted approximately 0.9 cm inferiorly from the diaphysis of the humerus.  Medullary bone of the metaphysis of the lateral condyle is highly comminuted. A nondisplaced component of the fracture extends through the lateral epicondyle. A fracture fragment measuring 0.7 cm AP x 1.2 cm craniocaudal x 0.9 cm craniocaudal is displaced into the lateral aspect of the olecranon fossa and rotated medially 180 degrees posteriorly.  The patient also has a fracture of the radial neck which is impacted approximately 0.2 cm and demonstrates minimal anterior displacement. The fracture does not appear to involve the articular surface of the radius. There is also a chip fracture of the medial aspect of the ulna at the sublime tubercle most consistent with avulsion at the attachment site of the ulnar collateral ligament. No other fracture is identified.  IMPRESSION: Comminuted intra-articular fracture of the distal humerus as described most consistent with an AO type C2 injury.  Mildly displaced fracture of the radial neck does not appear to involve the articular surface of the radius.  Small avulsion fracture of the medial aspect of the ulna is most consistent with avulsion at the ulnar collateral ligament attachment site.  Extensive soft tissue gas worrisome for open fracture.   Electronically Signed   By: Inge Rise M.D.   On: 12/17/2014 13:05   Dg Chest Portable 1 View  12/17/2014   CLINICAL DATA:  Recent fall from ladder  EXAM: PORTABLE CHEST - 1 VIEW  COMPARISON:  None.  FINDINGS: The heart size and mediastinal contours are within normal limits. Both lungs are clear.  The visualized skeletal structures are unremarkable.  IMPRESSION: No active disease.   Electronically Signed   By: Inez Catalina M.D.   On: 12/17/2014 10:18   Dg Shoulder Right Port  12/22/2014   CLINICAL DATA:  Proximal right humeral fracture  EXAM: PORTABLE RIGHT SHOULDER - 2+ VIEW  COMPARISON:  12/17/2014  FINDINGS: Comminuted fracture of the proximal right humerus is again noted with some impaction at the fracture site. No  chest wall abnormality is noted.  IMPRESSION: Persisting comminuted proximal right humeral fracture with mild impaction at the fracture site.   Electronically Signed   By: Inez Catalina M.D.   On: 12/22/2014 19:30   Dg Humerus Right  12/17/2014   CLINICAL DATA:  Fall 10 feet from ladder with open humeral fracture  EXAM: RIGHT HUMERUS - 2+ VIEW  COMPARISON:  None.  FINDINGS: There is a comminuted fracture of the distal right humerus involving the elbow joint with impaction at the fracture site. Fracture through the radial neck is noted as well with some impaction there is also deformity of the proximal humerus consistent with a impacted surgical neck fracture. Shoulder films may be helpful for further evaluation.  IMPRESSION: Comminuted distal humeral fracture involving the elbow joint with associated radial neck fracture. Impaction at the fracture site is noted.  Irregularity of the proximal humerus consistent with a surgical neck fracture with impaction.   Electronically Signed   By: Inez Catalina M.D.   On: 12/17/2014 10:17   Dg C-arm 61-120 Min  12/22/2014   CLINICAL DATA:  ORIF severely comminuted fracture of the distal right humerus secondary to a 10 foot fall from a ladder 12/17/2014.  EXAM: OPERATIVE RIGHT ELBOW - 2 VIEW  COMPARISON:  Right elbow x-rays 12/17/2014 and right elbow CT of that same date.  FINDINGS: Two spot images from the C-arm fluoroscopic device, AP and lateral views of the right elbow are submitted for interpretation postoperatively. These demonstrate plate and screw fixation of the severely comminuted intra-articular fracture involving the distal humerus. Alignment appears anatomic. The radial neck fracture is less well visualized currently.  The radiologic technologist documented 30 sec of fluoroscopy time.  IMPRESSION: Anatomic alignment post ORIF of the severely comminuted intra-articular distal humerus fracture.   Electronically Signed   By: Evangeline Dakin M.D.   On: 12/22/2014  18:02    Disposition: 01-Home or Self Care      Discharge Instructions    Discharge patient    Complete by:  As directed            Follow-up Information    Follow up with Mcarthur Rossetti, MD. Schedule an appointment as soon as possible for a visit in 2 weeks.   Specialty:  Orthopedic Surgery   Contact information:   [place] [address] [phone]        Signed: Mcarthur Rossetti 12/23/2014, 7:15 AM

## 2014-12-23 NOTE — Op Note (Signed)
NAMEELFREDA, BLANCHET NO.:  [id number]  MEDICAL RECORD NO.:  [id number]  LOCATION:  5N06C                        FACILITY:  [facility]  PHYSICIAN:  Lind Guest. Ninfa Linden, M.D.DATE OF BIRTH:  10-Mar-1941  DATE OF PROCEDURE:  12/22/2014 DATE OF DISCHARGE:                              OPERATIVE REPORT   PREOPERATIVE DIAGNOSES: 1. Right comminuted distal humerus supracondylar humerus fracture with     intercondylar extension. 2. Right radial head and neck nondisplaced fracture.  POSTOPERATIVE DIAGNOSES: 1. Right comminuted distal humerus supracondylar humerus fracture with     intercondylar extension. 2. Right radial head and neck nondisplaced fracture.  PROCEDURE:  Open reduction internal fixation of right supracondylar distal humerus fracture with intercondylar extension only.  SURGEON:  Jean Rosenthal, MD.  ASSISTING SURGEON:  Eduard Roux, MD.  FIRST ASSISTING:  Erskine Emery, PA-C.  ANESTHESIA:  General.  ANTIBIOTICS:  A 2 g IV Ancef.  TOURNIQUET TIME:  Two hours.  ESTIMATED BLOOD LOSS:  Less than 200 mL.  COMPLICATIONS:  None.  INDICATIONS:  Ms. Birchler is a 74 year old right-hand dominant female who last week was up hanging outside and fell about 9 feet off a ladder landing on mainly her right elbow.  She was found to have a minimally displaced proximal humerus fracture on the right side but a highly comminuted distal humerus fracture on the right side which was a supracondylar fracture with intercondylar extension, seen also on CT scan.  She also had a nondisplaced to minimally displaced radial neck fracture.  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  She understands that we may or may not  address the radial head and neck at this operative intervention, and we are not addressing the shoulder right now, due to the multiple injuries.  PROCEDURE DESCRIPTION:  After informed consent was obtained, appropriate right arm was marked.  She was brought to the operating room, placed supine on the operating table.  General anesthesia was then obtained. She was then turned in lateral decubitus position with the hip positioned to the front and the back and a positioner for her arm.  Her right arm was then prepped and draped from the axilla and shoulder down to the wrist with DuraPrep and sterile drapes including a sterile stockinette.  A time-out was called to identify the correct patient and correct right elbow.  We then placed a sterile tourniquet around her upper arm and elevated the tourniquet to 250 mm of pressure.  I then made a direct manual incision over the olecranon of the elbow and carried this proximally and distally.  I dissected down the fracture and performed a triceps splitting approach, although medial you could see where she had really shredded the triceps medially.  I was able to then identify the ulnar nerve in its entirety and mobilized the ulnar nerve. We then found a highly comminuted supracondylar humerus fracture with intercondylar extension and multiple pieces.  We took some time to tease all the pieces into as close  reduced position as possible.  We then chose [employer] distal humeral locking plate and secured medial contoured plate with an interrupted locking and nonlocking screws as well as a posterolateral plate.  This was after temporary fixation of the intercondylar with a tenaculum clamp as well as temporary K-wires. All the K-wires were removed.  This was again accomplished under direct fluoroscopy.  After significant amount of time for articulating the fracture, we noticed the radial head and neck piece did not move, and we felt that due  to this position, there was more difficulty with even considering a radial head replacement.  We felt that due to the highly comminuted nature of the elbow, we are going to have to limit her motion, also due to her proximal humerus fracture.  So, we felt inclined to not address the radial head at this juncture.  We then irrigated the soft tissue with normal saline solution, let the tourniquet down. Hemostasis was obtained with electrocautery.  We then closed the tissue over the plate with interrupted 0 Vicryl suture, including closing the triceps.  We closed the superficial tissue with interrupted 2-0 Vicryl, followed by interrupted 2-0 nylon on the skin.  Xeroform and a well- padded sterile dressing was applied as well as a posterior plaster splint.  Her arm was placed in a sling.  She was rolled back into supine position, awakened, extubated, and taken to the recovery room in stable condition.  All final counts were correct.  There were no complications noted.     Lind Guest. Ninfa Linden, M.D.     CYB/MEDQ  D:  12/22/2014  T:  12/23/2014  Job:  [id number]

## 2015-02-18 ENCOUNTER — Encounter: Payer: Self-pay | Admitting: Physical Therapy

## 2015-02-18 ENCOUNTER — Ambulatory Visit: Payer: Medicare Other | Attending: Physician Assistant | Admitting: Physical Therapy

## 2015-02-18 DIAGNOSIS — M25611 Stiffness of right shoulder, not elsewhere classified: Secondary | ICD-10-CM | POA: Insufficient documentation

## 2015-02-18 DIAGNOSIS — M25641 Stiffness of right hand, not elsewhere classified: Secondary | ICD-10-CM | POA: Diagnosis present

## 2015-02-18 DIAGNOSIS — M25511 Pain in right shoulder: Secondary | ICD-10-CM | POA: Diagnosis present

## 2015-02-18 DIAGNOSIS — R609 Edema, unspecified: Secondary | ICD-10-CM | POA: Insufficient documentation

## 2015-02-18 DIAGNOSIS — M25521 Pain in right elbow: Secondary | ICD-10-CM | POA: Diagnosis present

## 2015-02-18 DIAGNOSIS — M25621 Stiffness of right elbow, not elsewhere classified: Secondary | ICD-10-CM | POA: Insufficient documentation

## 2015-02-18 DIAGNOSIS — M25631 Stiffness of right wrist, not elsewhere classified: Secondary | ICD-10-CM | POA: Insufficient documentation

## 2015-02-18 NOTE — Therapy (Signed)
[facility] [address] [place], [place], [address] Phone: [phone]   Fax:  [phone]  Physical Therapy Evaluation  Patient Details  Name: Katherine Barnett MRN: [id number] Date of Birth: 05-05-1941 Referring Provider:  Pete Pelt, PA-C  Encounter Date: 02/18/2015      PT End of Session - 02/18/15 1008    Visit Number 1   Number of Visits 10  Medicare   Date for PT Re-Evaluation 05/13/15   PT Start Time 0930   PT Stop Time 1005   PT Time Calculation (min) 35 min   Activity Tolerance Patient tolerated treatment well   Behavior During Therapy [facility] for tasks assessed/performed      Past Medical History  Diagnosis Date  . Hypertension   . Thyroid disease     pt denies this (12/18/14)  . Diabetes mellitus without complication     type 2 - diet controlled  . Complication of anesthesia     vomited after colonoscopy    Past Surgical History  Procedure Laterality Date  . Eye surgery Bilateral     cataract surgery with lens implant  . Colonoscopy    . Orif humerus fracture Right 12/22/2014    Procedure: OPEN REDUCTION INTERNAL FIXATION (ORIF) RIGHT DISTAL HUMERUS FRACTURE;  Surgeon: Mcarthur Rossetti, MD;  Location: [facility];  Service: Orthopedics;  Laterality: Right;    There were no vitals filed for this visit.  Visit Diagnosis:  Shoulder stiffness, right - Plan: PT plan of care cert/re-cert  Elbow stiffness, right - Plan: PT plan of care cert/re-cert  Wrist stiffness, right - Plan: PT plan of care cert/re-cert  Stiffness of right hand joint - Plan: PT plan of care cert/re-cert  Right shoulder pain - Plan: PT plan of care cert/re-cert  Right elbow pain - Plan: PT plan of care cert/re-cert      Subjective Assessment - 02/18/15 0938    Subjective Patient reports she was going to paint and when she reached for paint she fell and broke her right arm. Patient had surgery on 12/22/2014.     Patient Stated Goals Patient wants to put her bra on, drive, dress, and use right arm for daily tasks   Currently in Pain? Yes   Pain Score 9    Pain Location Arm   Pain Orientation Right   Pain Descriptors / Indicators Tightness;Sore  trembling   Pain Type Surgical pain   Pain Onset 1 to 4 weeks ago   Pain Frequency Intermittent   Aggravating Factors  movement   Pain Relieving Factors rest   Effect of Pain on Daily Activities unable to use right arm for tasks   Multiple Pain Sites No            [facility] PT Assessment - 02/18/15 0001    Assessment   Medical Diagnosis s/p ORIF right Supra Condylar Humerus Fracture 6/1/201; Right proimal humerus  fracture   Onset Date/Surgical Date 12/22/14   Hand Dominance Right   Prior Therapy None   Precautions   Precautions Shoulder   Type of Shoulder Precautions No abduction right shoulder, no lifting 5 pounds; gentle ROM right elbow, right forearm, and shoulder   Balance Screen   Has the patient fallen in the past 6 months Yes  fell while reaching for a paint cane   How many times? 1   Has the patient had a decrease in activity level because of a fear of falling?  No  Is the patient reluctant to leave their home because of a fear of falling?  No   Prior Function   Level of Independence Needs assistance with ADLs;Needs assistance with homemaking;Requires assistive device for independence   Dressing Minimal  putting bra on    Grooming Maximal  goes out to get hair washed   Meal Prep Maximal  unable to cook   Light Housekeeping Minimal   Driving No   Observation/Other Assessments   Skin Integrity decreased mobility of scar on posterior right elbow   Focus on Therapeutic Outcomes (FOTO)  71% limitation  goal is 39% limitation   Observation/Other Assessments-Edema    Edema --  right hand has edema   Coordination   Fine Motor Movements are Fluid and Coordinated No   ROM / Strength   AROM / PROM / Strength AROM;PROM;Strength   AROM    AROM Assessment Site Shoulder;Elbow;Forearm;Wrist;Finger   Right/Left Shoulder Right   Right Shoulder Extension 25 Degrees   Right Shoulder Flexion 25 Degrees   Right Shoulder Internal Rotation 55 Degrees  0 degree abduction    Right Shoulder External Rotation -25 Degrees  0 degree abduction    Right/Left Elbow Right   Right Elbow Flexion 90   Right Elbow Extension 145   Right/Left Forearm Right   Right Forearm Pronation 90 Degrees   Right Forearm Supination 0 Degrees   Right/Left Wrist Right   Right Wrist Extension 23 Degrees   Right Wrist Flexion 33 Degrees   Right/Left Finger Right   Right Composite Finger Flexion 25%  of motion, swelling in right hand   PROM   PROM Assessment Site Shoulder;Elbow;Forearm;Wrist   Right/Left Shoulder Right   Right Shoulder Flexion 90 Degrees   Right Shoulder Internal Rotation 50 Degrees  0 degree abduction   Right Shoulder External Rotation -9 Degrees  0 degree abduction    Right/Left Elbow Right   Right Elbow Flexion 90   Right Elbow Extension -25   Right/Left Forearm Right   Right Forearm Supination 10 Degrees   Right/Left Wrist Right   Right Wrist Extension 30 Degrees   Right Wrist Radial Deviation 52 Degrees   Strength   Overall Strength Unable to assess  due to recent surgery and MD only wanting gentle PROM   Strength Assessment Site Hand   Right/Left hand Right   Right Hand Grip (lbs) 2  left 30#                           PT Education - 02/18/15 1008    Education provided Yes   Education Details instructed patient to get a stress ball to squeeze to improve grip strength and finger mobility   Person(s) Educated Patient   Methods Explanation;Verbal cues   Comprehension Verbalized understanding          PT Short Term Goals - 02/18/15 1010    PT SHORT TERM GOAL #1   Title independent with initital HEP for shoulder flexion and elbow ROM   Time 4   Period Weeks   Status New   PT SHORT TERM GOAL #2    Title pain in right shoulder and elbow decreased >/= 25%   Time 4   Period Weeks   Status New   PT SHORT TERM GOAL #3   Title right shoulder flexion PROM >/= 115 degrees   Time 4   Period Weeks   Status New   PT SHORT TERM GOAL #4  Title right elbow extension >/= -5 degrees   Time 4   Period Weeks   Status New   PT SHORT TERM GOAL #5   Title right elbow flexion >/= 110 degrees PROM   Time 4   Period Weeks   Status New           PT Long Term Goals - 02/18/15 1013    PT LONG TERM GOAL #1   Title independent with HEP and understand how to progress herself   Time 12   Period Weeks   Status New   PT LONG TERM GOAL #2   Title grip strength on right >/= 30 pounds   Time 12   Period Weeks   Status New   PT LONG TERM GOAL #3   Title ability to put her bra on due to right shoulder AROM within functional limits   Time 12   Period Weeks   Status New   PT LONG TERM GOAL #4   Title return to driving due to right shoulder strength 4/5   Time 12   Period Weeks   Status New   PT LONG TERM GOAL #5   Title able to cook due to right elbow AROM within functional limits   Time 12   Period Weeks   Status New               Plan - 02/18/15 1015    Clinical Impression Statement Patient is a 74 year old female with diagnosis fo S/P ORIF right supra condylar humerus fracture 12/23/2014 and right proximal humerus fracture fracture.  Patient had surgery for right elbow ORIF on 12/22/2914.  MD wants gentle ROM Right elbow, right forearm, and shoulder.   No abduction of right shoulder and no lifting more than 5 pounds on right. FOTO score is 71% limitaiton. Right shoulder AROM in degrees is extesnion 25, flexion 25, and ER at 0 degrees abduction -25.  right hsoulder PROM in degrees is flexion 90 and ER at 0 degrees abduction -9.  right elbow AROM in degrees is flexion 90 and extension is 145.  Right elbow PROM in degress is flexion 90 and extesnion -25, supination 10.  righ twrist AROM  in degrees  is extension 23 and felxion 33. Grip strength is 2 pounds on right .  Patient is right handed.  Patient has 75% decreased mobility of right fingers wth swelling. Decreased mobility of surgical site of  scar on posterior right elbow. Patient would benefit from physical therapy to imporve mobility of right upper extremity and strength to return to functional activiites.    Pt will benefit from skilled therapeutic intervention in order to improve on the following deficits Decreased range of motion;Increased fascial restricitons;Impaired UE functional use;Increased muscle spasms;Decreased endurance;Decreased activity tolerance;Pain;Impaired flexibility;Hypomobility;Decreased scar mobility;Decreased mobility;Decreased strength;Increased edema   Rehab Potential Good   Clinical Impairments Affecting Rehab Potential None   PT Frequency 3x / week   PT Duration 12 weeks   PT Treatment/Interventions ADLs/Self Care Home Management;Cryotherapy;Electrical Stimulation;Ultrasound;Moist Heat;Functional mobility training;Therapeutic activities;Therapeutic exercise;Neuromuscular re-education;Manual techniques;Patient/family education;Scar mobilization;Passive range of motion;Taping   PT Next Visit Plan gentle ROM of right shoulder, right elbow, right wrist, right fingers, NO SHOULDER ABDUCTION, NO LIFTING OVER 5 POUNDS, soft tissue work to right elbow and scar massage   PT Home Exercise Plan ROM exercises   Recommended Other Services None   Consulted and Agree with Plan of Care Patient          G-Codes -  02/18/15 1003    Functional Assessment Tool Used FOTO score is 71% limitation   Functional Limitation Other PT primary   Other PT Primary Current Status (R6151) At least 60 percent but less than 80 percent impaired, limited or restricted   Other PT Primary Goal Status (I3437) At least 20 percent but less than 40 percent impaired, limited or restricted       Problem List Patient Active Problem List    Diagnosis Date Noted  . Right supracondylar humerus fracture; right radial head/neck fracture 12/22/2014    GRAY,CHERYL,PT 02/18/2015, 10:42 AM  [facility] [address], [place], [place], [address] Phone: [phone]   Fax:  [phone]

## 2015-02-19 ENCOUNTER — Ambulatory Visit: Payer: Medicare Other | Admitting: Physical Therapy

## 2015-02-19 ENCOUNTER — Encounter: Payer: Self-pay | Admitting: Physical Therapy

## 2015-02-19 DIAGNOSIS — M25611 Stiffness of right shoulder, not elsewhere classified: Secondary | ICD-10-CM

## 2015-02-19 DIAGNOSIS — M25641 Stiffness of right hand, not elsewhere classified: Secondary | ICD-10-CM

## 2015-02-19 DIAGNOSIS — M25521 Pain in right elbow: Secondary | ICD-10-CM

## 2015-02-19 DIAGNOSIS — R609 Edema, unspecified: Secondary | ICD-10-CM

## 2015-02-19 DIAGNOSIS — M25511 Pain in right shoulder: Secondary | ICD-10-CM

## 2015-02-19 DIAGNOSIS — M25631 Stiffness of right wrist, not elsewhere classified: Secondary | ICD-10-CM

## 2015-02-19 DIAGNOSIS — M25621 Stiffness of right elbow, not elsewhere classified: Secondary | ICD-10-CM

## 2015-02-19 NOTE — Therapy (Signed)
[facility] [address] [place], [place], [address] Phone: [phone]   Fax:  [phone]  Physical Therapy Treatment  Patient Details  Name: Katherine Barnett MRN: [id number] Date of Birth: May 23, 1941 Referring Provider:  [name], MD  Encounter Date: 02/19/2015      PT End of Session - 02/19/15 0962    Visit Number 2   Number of Visits 10   Date for PT Re-Evaluation 05/13/15   PT Start Time 0841   PT Stop Time 0930   PT Time Calculation (min) 49 min   Activity Tolerance Patient tolerated treatment well   Behavior During Therapy [facility] for tasks assessed/performed      Past Medical History  Diagnosis Date  . Hypertension   . Thyroid disease     pt denies this (12/18/14)  . Diabetes mellitus without complication     type 2 - diet controlled  . Complication of anesthesia     vomited after colonoscopy    Past Surgical History  Procedure Laterality Date  . Eye surgery Bilateral     cataract surgery with lens implant  . Colonoscopy    . Orif humerus fracture Right 12/22/2014    Procedure: OPEN REDUCTION INTERNAL FIXATION (ORIF) RIGHT DISTAL HUMERUS FRACTURE;  Surgeon: Mcarthur Rossetti, MD;  Location: [facility];  Service: Orthopedics;  Laterality: Right;    There were no vitals filed for this visit.  Visit Diagnosis:  Shoulder stiffness, right  Elbow stiffness, right  Wrist stiffness, right  Stiffness of right hand joint  Right shoulder pain  Right elbow pain  Edema      Subjective Assessment - 02/19/15 0842    Subjective No pain, but a lot of swelling in hand, fingers and wrist this AM   Currently in Pain? No/denies   Multiple Pain Sites No                         [facility] Adult PT Treatment/Exercise - 02/19/15 0001    Cryotherapy   Number Minutes Cryotherapy 10 Minutes   Cryotherapy Location --  RT wrist and hand   Type of Cryotherapy --  Game ready 3 flakes  medium   Manual Therapy   Edema Management Retrograde milliking to fingers and hand   Passive ROM RT wrist, elbow,foreamr, and shoulder all gentle and to pts tolerance                PT Education - 02/19/15 0854    Education provided Yes   Education Details HEP: wrist, elbow AROM, scap squeezes, finger and hand milking for swelling   Person(s) Educated Patient   Methods Explanation;Demonstration;Tactile cues;Verbal cues;Handout   Comprehension Verbalized understanding;Returned demonstration          PT Short Term Goals - 02/18/15 1010    PT SHORT TERM GOAL #1   Title independent with initital HEP for shoulder flexion and elbow ROM   Time 4   Period Weeks   Status New   PT SHORT TERM GOAL #2   Title pain in right shoulder and elbow decreased >/= 25%   Time 4   Period Weeks   Status New   PT SHORT TERM GOAL #3   Title right shoulder flexion PROM >/= 115 degrees   Time 4   Period Weeks   Status New   PT SHORT TERM GOAL #4   Title right elbow extension >/= -5 degrees   Time 4  Period Weeks   Status New   PT SHORT TERM GOAL #5   Title right elbow flexion >/= 110 degrees PROM   Time 4   Period Weeks   Status New           PT Long Term Goals - Mar 14, 2015 1013    PT LONG TERM GOAL #1   Title independent with HEP and understand how to progress herself   Time 12   Period Weeks   Status New   PT LONG TERM GOAL #2   Title grip strength on right >/= 30 pounds   Time 12   Period Weeks   Status New   PT LONG TERM GOAL #3   Title ability to put her bra on due to right shoulder AROM within functional limits   Time 12   Period Weeks   Status New   PT LONG TERM GOAL #4   Title return to driving due to right shoulder strength 4/5   Time 12   Period Weeks   Status New   PT LONG TERM GOAL #5   Title able to cook due to right elbow AROM within functional limits   Time 12   Period Weeks   Status New               Plan - 02/19/15 0920    Clinical  Impression Statement Pt tolerated PROM well, remains very stiff in forearm, elbow and shoulder. We added to HEP so pt can perform AROM daily. Fingers and hand quite swollen today, instructed pt to try alternating bt hot and cold at hpome which she agreeed to try. Only second visit so too early for goals.    Pt will benefit from skilled therapeutic intervention in order to improve on the following deficits Decreased range of motion;Increased fascial restricitons;Impaired UE functional use;Increased muscle spasms;Decreased endurance;Decreased activity tolerance;Pain;Impaired flexibility;Hypomobility;Decreased scar mobility;Decreased mobility;Decreased strength;Increased edema   Rehab Potential Good   Clinical Impairments Affecting Rehab Potential None   PT Frequency 3x / week   PT Duration 12 weeks   PT Treatment/Interventions ADLs/Self Care Home Management;Cryotherapy;Electrical Stimulation;Ultrasound;Moist Heat;Functional mobility training;Therapeutic activities;Therapeutic exercise;Neuromuscular re-education;Manual techniques;Patient/family education;Scar mobilization;Passive range of motion;Taping   PT Next Visit Plan gentle ROM of right shoulder, right elbow, right wrist, right fingers, NO SHOULDER ABDUCTION, NO LIFTING OVER 5 POUNDS, soft tissue work to right elbow and scar massage   Consulted and Agree with Plan of Care Patient          G-Codes - 03-14-15 1003    Functional Assessment Tool Used FOTO score is 71% limitation   Functional Limitation Other PT primary   Other PT Primary Current Status (I2035) At least 60 percent but less than 80 percent impaired, limited or restricted   Other PT Primary Goal Status (D9741) At least 20 percent but less than 40 percent impaired, limited or restricted      Problem List Patient Active Problem List   Diagnosis Date Noted  . Right supracondylar humerus fracture; right radial head/neck fracture 12/22/2014    Farzana Koci , PTA  02/19/2015,  9:28 AM  [facility] [address], [place], [place], [address] Phone: [phone]   Fax:  [phone]

## 2015-02-19 NOTE — Patient Instructions (Signed)
ROM: Pendulum (Circular)  Let right arm move in circle clockwise, then counterclockwise, by rocking body weight in circular pattern. Circle _10___ times each direction per set. Do _3___ sessions per day.  Pendulum Side to Side  Bend forward 90 at waist, leaning on table for support. Rock body from side to side and let arm swing freely. Repeat _10___ times. Do __3__ sessions per day.  Finger Flexors  Keeping right fingertips straight, press putty toward base of palm. Repeat __20__ times. Do __3__ sessions per day. Activity: Squeeze flour sifter, plastic squeeze bottles, [place] baster, juice from fruit.*  AROM: Elbow Flexion / Extension  With left hand palm up, gently bend elbow as far as possible. Then straighten arm as far as possible. Repeat __10__ times per set. Do __3__ sessions per day.  SHOULDER: Flexion On Table  Place hands on table, elbows straight. Move hips away from body. Press hands down into table. Hold _3__ seconds. _10__ reps per set, __3_ sets per day. Posture - Sitting   Sit upright, head facing forward. Try using a roll to support lower back. Keep shoulders relaxed, and avoid rounded back. Keep hips level with knees. Avoid crossing legs for long periods.  Copyright  VHI. All rights reserved.   Cryotherapy  ICE 20 mins at most, 3 times a day following exercises.   Cryotherapy means treatment with cold. Ice or gel packs can be used to reduce both pain and swelling. Ice is the most helpful within the first 24 to 48 hours after an injury or flare-up from overusing a muscle or joint. Sprains, strains, spasms, burning pain, shooting pain, and aches can all be eased with ice. Ice can also be used when recovering from surgery. Ice is effective, has very few side effects, and is safe for most people to use. PRECAUTIONS  Ice is not a safe treatment option for people with:  Raynaud phenomenon. This is a condition affecting small blood vessels in the extremities.  Exposure to cold may cause your problems to return.  Cold hypersensitivity. There are many forms of cold hypersensitivity, including:  Cold urticaria. Red, itchy hives appear on the skin when the tissues begin to warm after being iced.  Cold erythema. This is a red, itchy rash caused by exposure to cold.  Cold hemoglobinuria. Red blood cells break down when the tissues begin to warm after being iced. The hemoglobin that carry oxygen are passed into the urine because they cannot combine with blood proteins fast enough.  Numbness or altered sensitivity in the area being iced. If you have any of the following conditions, do not use ice until you have discussed cryotherapy with your caregiver:  Heart conditions, such as arrhythmia, angina, or chronic heart disease.  High blood pressure.  Healing wounds or open skin in the area being iced.  Current infections.  Rheumatoid arthritis.  Poor circulation.  Diabetes. Ice slows the blood flow in the region it is applied. This is beneficial when trying to stop inflamed tissues from spreading irritating chemicals to surrounding tissues. However, if you expose your skin to cold temperatures for too long or without the proper protection, you can damage your skin or nerves. Watch for signs of skin damage due to cold. HOME CARE INSTRUCTIONS Follow these tips to use ice and cold packs safely.  Place a dry or damp towel between the ice and skin. A damp towel will cool the skin more quickly, so you may need to shorten the time that the ice is  used.  For a more rapid response, add gentle compression to the ice.  Ice for no more than 20 minutes at a time. The bonier the area you are icing, the less time it will take to get the benefits of ice.  Check your skin after 5 minutes to make sure there are no signs of a poor response to cold or skin damage.  Rest 20 minutes or more between uses.  Once your skin is numb, you can end your treatment. You can  test numbness by very lightly touching your skin. The touch should be so light that you do not see the skin dimple from the pressure of your fingertip. When using ice, most people will feel these normal sensations in this order: cold, burning, aching, and numbness.  Do not use ice on someone who cannot communicate their responses to pain, such as small children or people with dementia. HOW TO MAKE AN ICE PACK Ice packs are the most common way to use ice therapy. Other methods include ice massage, ice baths, and cryosprays. Muscle creams that cause a cold, tingly feeling do not offer the same benefits that ice offers and should not be used as a substitute unless recommended by your caregiver. To make an ice pack, do one of the following:  Place crushed ice or a bag of frozen vegetables in a sealable plastic bag. Squeeze out the excess air. Place this bag inside another plastic bag. Slide the bag into a pillowcase or place a damp towel between your skin and the bag.  Mix 3 parts water with 1 part rubbing alcohol. Freeze the mixture in a sealable plastic bag. When you remove the mixture from the freezer, it will be slushy. Squeeze out the excess air. Place this bag inside another plastic bag. Slide the bag into a pillowcase or place a damp towel between your skin and the bag. SEEK MEDICAL CARE IF:  You develop white spots on your skin. This may give the skin a blotchy (mottled) appearance.  Your skin turns blue or pale.  Your skin becomes waxy or hard.  Your swelling gets worse. MAKE SURE YOU:   Understand these instructions.  Will watch your condition.  Will get help right away if you are not doing well or get worse. Document Released: 03/06/2011 Document Revised: 11/24/2013 Document Reviewed: 03/06/2011 [facility] Patient Information 2015 [place], [place]. This information is not intended to replace advice given to you by your health care provider. Make sure you discuss any questions you have  with your health care provider.   SCAPULAR SQUEEZES: Lay on your back or sitting position. Squeeze the shoulder blades together gently, hold for count of 3, do 5x. Do this every hour.  Milk/Massage your knuckles: Do this intermittently as you squeeze the ball at home. Then, gently move the skin around your elbow scar working on places that feel hard. No lotion for this, you can put your lotion on after. Do this 3x day

## 2015-02-22 ENCOUNTER — Encounter: Payer: Self-pay | Admitting: Physical Therapy

## 2015-02-22 ENCOUNTER — Ambulatory Visit: Payer: Medicare Other | Attending: Physician Assistant | Admitting: Physical Therapy

## 2015-02-22 DIAGNOSIS — M25611 Stiffness of right shoulder, not elsewhere classified: Secondary | ICD-10-CM | POA: Insufficient documentation

## 2015-02-22 DIAGNOSIS — M25641 Stiffness of right hand, not elsewhere classified: Secondary | ICD-10-CM | POA: Insufficient documentation

## 2015-02-22 DIAGNOSIS — R609 Edema, unspecified: Secondary | ICD-10-CM | POA: Insufficient documentation

## 2015-02-22 DIAGNOSIS — M25511 Pain in right shoulder: Secondary | ICD-10-CM

## 2015-02-22 DIAGNOSIS — M25621 Stiffness of right elbow, not elsewhere classified: Secondary | ICD-10-CM | POA: Insufficient documentation

## 2015-02-22 DIAGNOSIS — M25631 Stiffness of right wrist, not elsewhere classified: Secondary | ICD-10-CM | POA: Diagnosis present

## 2015-02-22 DIAGNOSIS — M25521 Pain in right elbow: Secondary | ICD-10-CM | POA: Insufficient documentation

## 2015-02-22 NOTE — Therapy (Signed)
[facility] [address] [place], [place], [address] Phone: [phone]   Fax:  [phone]  Physical Therapy Treatment  Patient Details  Name: Katherine Barnett MRN: [id number] Date of Birth: Nov 29, 1940 Referring Provider:  Pete Pelt, PA-C  Encounter Date: 02/22/2015      PT End of Session - 02/22/15 0939    Visit Number 3   Number of Visits 10  medicare   Date for PT Re-Evaluation 05/13/15   PT Start Time 0930   PT Stop Time 6789   PT Time Calculation (min) 45 min   Activity Tolerance Patient tolerated treatment well   Behavior During Therapy [facility] for tasks assessed/performed      Past Medical History  Diagnosis Date  . Hypertension   . Thyroid disease     pt denies this (12/18/14)  . Diabetes mellitus without complication     type 2 - diet controlled  . Complication of anesthesia     vomited after colonoscopy    Past Surgical History  Procedure Laterality Date  . Eye surgery Bilateral     cataract surgery with lens implant  . Colonoscopy    . Orif humerus fracture Right 12/22/2014    Procedure: OPEN REDUCTION INTERNAL FIXATION (ORIF) RIGHT DISTAL HUMERUS FRACTURE;  Surgeon: Mcarthur Rossetti, MD;  Location: [facility];  Service: Orthopedics;  Laterality: Right;    There were no vitals filed for this visit.  Visit Diagnosis:  Shoulder stiffness, right  Elbow stiffness, right  Wrist stiffness, right  Right shoulder pain  Stiffness of right hand joint  Right elbow pain  Edema      Subjective Assessment - 02/22/15 0937    Subjective I have pain today.  Do I do exercises at home on the days I have therapy. The ice machine was hard to do .    Patient Stated Goals Patient wants to put her bra on, drive, dress, and use right arm for daily tasks   Currently in Pain? Yes   Pain Score 9    Pain Location Arm   Pain Orientation Right   Pain Type Surgical pain   Pain Onset 1 to 4 weeks ago    Pain Frequency Intermittent   Aggravating Factors  movement   Pain Relieving Factors rest   Effect of Pain on Daily Activities unable to use right arm for tasks   Multiple Pain Sites No            [facility] PT Assessment - 02/22/15 0001    Precautions   Precaution Comments osteoporosis   PROM   Right Elbow Flexion 100   Right Elbow Extension -25   Right/Left Wrist Right   Right Wrist Extension 50 Degrees   Right Wrist Flexion 60 Degrees                     [facility] Adult PT Treatment/Exercise - 02/22/15 0001    Exercises   Exercises Hand;Elbow;Shoulder   Elbow Exercises   Elbow Flexion AAROM;Right;15 reps   Elbow Extension AAROM;Right;15 reps   Forearm Supination AAROM;Right;15 reps   Wrist Flexion AAROM;Right;15 reps   Wrist Extension AAROM;Right;10 reps   Shoulder Exercises: Supine   External Rotation AAROM;Right;15 reps  wiht shoulder 0 degree abduction   Flexion AAROM;Right;20 reps   Other Supine Exercises left sidely with right shoulder elevation depression 20 times   Manual Therapy   Manual Therapy Joint mobilization;Soft tissue mobilization;Passive ROM   Edema Management  Retrograde milliking to fingers and hand   Soft tissue mobilization to right hand, right elbow   Passive ROM right fingers, right wrist, right elbow, right shoulder flexion, right shoulder IR and ER with elbow at side                PT Education - 02/22/15 1020    Education provided No          PT Short Term Goals - 02/22/15 1021    PT SHORT TERM GOAL #1   Title independent with initital HEP for shoulder flexion and elbow ROM   Time 4   Period Weeks   Status Achieved   PT SHORT TERM GOAL #2   Title pain in right shoulder and elbow decreased >/= 25%   Time 4   Period Weeks   Status On-going   PT SHORT TERM GOAL #3   Title right shoulder flexion PROM >/= 115 degrees   Time 4   Period Weeks   Status On-going   PT SHORT TERM GOAL #4   Title right elbow extension  >/= -5 degrees   Time 4   Period Weeks   Status On-going  -25   PT SHORT TERM GOAL #5   Title right elbow flexion >/= 110 degrees PROM   Time 4   Period Weeks   Status On-going  100 degrees           PT Long Term Goals - 02/18/15 1013    PT LONG TERM GOAL #1   Title independent with HEP and understand how to progress herself   Time 12   Period Weeks   Status New   PT LONG TERM GOAL #2   Title grip strength on right >/= 30 pounds   Time 12   Period Weeks   Status New   PT LONG TERM GOAL #3   Title ability to put her bra on due to right shoulder AROM within functional limits   Time 12   Period Weeks   Status New   PT LONG TERM GOAL #4   Title return to driving due to right shoulder strength 4/5   Time 12   Period Weeks   Status New   PT LONG TERM GOAL #5   Title able to cook due to right elbow AROM within functional limits   Time 12   Period Weeks   Status New               Plan - 02/22/15 1021    Clinical Impression Statement Patient has edema in right hand and understands to elevate during the day  and move her hand to reduce swelling.  Elbow flexion increased from 90 to 100 degrees, wrist flexion increased to 60 degrees and extesnion increased from 30 to 50 degrees. Patient is independent with HEP. Patient continues to benefit from physical therapy to improve right upper extremity strength and ROM while working with MD guidelines.    Pt will benefit from skilled therapeutic intervention in order to improve on the following deficits Decreased range of motion;Increased fascial restricitons;Impaired UE functional use;Increased muscle spasms;Decreased endurance;Decreased activity tolerance;Pain;Impaired flexibility;Hypomobility;Decreased scar mobility;Decreased mobility;Decreased strength;Increased edema   Rehab Potential Good   Clinical Impairments Affecting Rehab Potential None   PT Frequency 3x / week   PT Duration 12 weeks   PT Treatment/Interventions  ADLs/Self Care Home Management;Cryotherapy;Electrical Stimulation;Ultrasound;Moist Heat;Functional mobility training;Therapeutic activities;Therapeutic exercise;Neuromuscular re-education;Manual techniques;Patient/family education;Scar mobilization;Passive range of motion;Taping   PT Next Visit Plan gentle  ROM of right shoulder, right elbow, right wrist, right fingers, NO SHOULDER ABDUCTION, NO LIFTING OVER 5 POUNDS, soft tissue work to right elbow and scar massage   PT Home Exercise Plan wrist isometrics and elbow   Consulted and Agree with Plan of Care Patient        Problem List Patient Active Problem List   Diagnosis Date Noted  . Right supracondylar humerus fracture; right radial head/neck fracture 12/22/2014    GRAY,CHERYL,PT 02/22/2015, 10:25 AM  [facility] [address] [place], [place], [address] Phone: [phone]   Fax:  [phone]

## 2015-02-23 ENCOUNTER — Ambulatory Visit: Payer: Medicare Other | Admitting: Physical Therapy

## 2015-02-23 ENCOUNTER — Encounter: Payer: Self-pay | Admitting: Physical Therapy

## 2015-02-23 DIAGNOSIS — M25621 Stiffness of right elbow, not elsewhere classified: Secondary | ICD-10-CM

## 2015-02-23 DIAGNOSIS — M25631 Stiffness of right wrist, not elsewhere classified: Secondary | ICD-10-CM

## 2015-02-23 DIAGNOSIS — M25611 Stiffness of right shoulder, not elsewhere classified: Secondary | ICD-10-CM

## 2015-02-23 DIAGNOSIS — M25521 Pain in right elbow: Secondary | ICD-10-CM

## 2015-02-23 DIAGNOSIS — R609 Edema, unspecified: Secondary | ICD-10-CM

## 2015-02-23 DIAGNOSIS — M25641 Stiffness of right hand, not elsewhere classified: Secondary | ICD-10-CM

## 2015-02-23 DIAGNOSIS — M25511 Pain in right shoulder: Secondary | ICD-10-CM

## 2015-02-23 NOTE — Patient Instructions (Signed)
  Osteoporosis   What is Osteoporosis?  - A silent disease in which the skeleton is weakened by decreased bone density. - Characterized by low bone mass, deterioration of bone, and increased risk of fracture postmenopausal (primary) or the result of an identifiable condition/event (secondary) - Commonly found in the wrists, spine, and hips; these are high-risk stress areas and very susceptible to fractures.  The Facts: - There are 1.5 million fractures/year o 500,000 spine; 250,000 hip with over 60,000 nursing home admissions secondary to hip fracture; and 200,000 wrist - After hip fracture, only 50% of people able to walk independently prior to the fracture return to independent ambulation. - Bone mass: Peaks at age 20-30, and begins declining at age 40-50.   Osteoporosis is defined by the World Health Organization (WHO) as:  NOF/WHO Criteria for Interpreting Results of Bone Density Assessment  Results Diagnosis  Within 1 standard deviation (SD) of young adult mean Normal  Between 1 and -2.5 SD below mean, repeat in 2 years Low bone mass (osteopenia)  Greater than -2.5 SD below mean Osteoporosis  Greater than -2.5 SD below mean and one or more fragility fractures exist Severe Osteoporosis  *Results can be affected by positioning of the body in the DEXA scan, presence of current or old fractures, arthritis, extraneous calcifications.    Osteoporosis is not just a women's disease!  - 30-40% of women will develop osteoporosis - 5-15% of males will develop osteoporosis   What are the risk factors?  1. Female 2. Thin, small frame 3. Caucasian, Asian race 4. Early menopause (<45 years old)/amenorrhea/delayed puberty 5. Old age 6. Family history (fractures, stooped posture)\ 7. Low calcium diet 8. Sedentary lifestyle 9. Alcohol, Caffeine, Smoking 10. Malnutrition, GI Disease 11. Prolonged use of Glucocorticoids (Prednisone), Meds to treat asthma, arthritis, cancers,  thyroid, and anti-seizure meds.  How do I know for sure?  Get a BONE DENSITY TEST!  This measures bone loss and it's painless, non-invasive, and only takes 5-10 minutes!  What can I do about it?  ? Decrease your risk factors (alcohol, caffeine, smoking) ? Helpful medications (see next page) ? Adequate Calcium and Vitamin D intake ? Get active! o Proper posture - Sit and stand tall! No slouching or twisting o Weight-Bearing Exercise - walking, stair climbing, elliptical; NO jogging or high-impact exercise. o Resistive Exercise - Cybex weight equipment, Nautilus, dumbbells, therabands  **Be sure to maintain proper alignment when lifting any weight!!  **When using equipment, avoid abdominal exercises which involve "crunching" or curling or twisting the trunk, biceps machines, cross-country machines, moving handlebars, or ANY MACHINE WITH ROTATION OR FORWARD BENDING!!!           Approved Pharmacologic Management of Osteoporosis  Agent Approved for prevention Approved for treatment BMD increased spine/hip Fracture reduction  Estrogen/Hormone Therapy (Estrace, Estratab, Ogen, Premarin, Vivell, Prempro, Femhert, Orthoest) Yes Yes 3-6% 35% spine and hip  Bisphosphonates  (Fosamax, Actonel, Boniva) Yes Yes 3-8% 35-50% spine and non-spine  Calcitonin (Miacalcin, Calcimar, Fortical) No Yes 0-3% None stated  Raloxifene (Evista) Yes Yes 2-3% 30-55%  Parathyroid Hormone (Forteo) No Yes, only in those at high risk for fracture None stated 53-65%     Recommended Daily Calcium Intakes   Population Group NIH/NOF* (mg elemental calcium)  Children 1-10 years 800-1200  Children 11-24 years 1200-1500  Men and Women 25-64 years At least 1200  Pregnant/Lactating At least 1200  Postmenopausal women with hormone replacement therapy At least 1200  Postmenopausal women without     hormone replacement therapy At least 1200  Men and women 65 + At least 1200  *In 1987, 1990, 1994, and 2000,  the NIH held consensus conferences on osteoporosis and calcium.  This column shows the most recent recommendations regarding calcium intak for preventing and managing osteoporosis.          Calcium Content of Selected Foods  Dairy Foods Calcium Content (mg) Non-Dairy Foods Calcium Content (mg)  Buttermilk, 1 cup 300 Calcium-fortified juice, 1 cup 300  Milk, 1 cup 300 Salmon, canned with Bones, 2 oz 100  Lactaid milk, 1 cup 300-500 Oysters, raw 13-19 medium 226  Soy milk, 1 cup 200-300 Sardines, canned with bones, 3 oz 372  Yogurt (plain, lowfat) 1 cup 250-300 Shrimp, canned 3 oz 98  Frozen yogurt (fruit) 1 cup 200-600 Collard greens, cooked 1 cup 357  Cheddar, mozzarella, or Muenster cheese, 1oz 205 Broccoli, cooked 1 cup 78  Cottage cheese (lowfat) 4 oz 200 Soybeans, cooked 1 cup 131  Part-skim ricotta cheese, 4oz 335 Tofu, 4oz* *  Vanilla ice cream, 1 cup 120-300    *Calcium content of tofu varies depending on processing method; check nutritional label on package for precise calcium content.     Suggested Guidelines for Calcium Supplement Use:  ? Calcium is absorbed most efficiently if taken in small amounts throughout the day.  Always divide the daily dose into smaller amounts if the total daily dose is 500mg or more per day.  The body cannot use more than 500mg Calcium at any one time. ? The use of manufactured supplements is encouraged.  Calcium as bone meal or dolomite may contain lead or other heavy metals as contaminants. ? Calcium supplements should not be taken with high fiber meals or with bulk forming laxatives. ? If calcium carbonate is used as the supplement form, it should be taken with meals to assure that stomach acid production is present to facilitate optimal dissolution and absorption of calcium.  This is important if atrophic gastritis with hypo- or achlorhydria is present, which it is in 20-50% of older individuals. ? It is important to drink plenty  of fluids while using the supplement to help reduce problems with side effects like constipation or bloating.  If these symptoms become a problem, switching to another form of supplement may be the answer. ? Another alternative is calcium-fortified foods, including fruit juices, cereals, and breads.  These foods are now marketed with added calcium and may be less likely to cause side effects. ? Those with personal or family histories of kidney stones should be monitored to assure that hypercalcuria does not occur. CALCIUM INTAKE QUIZ  Dairy products are the primary source of calcium for most people.  For a quick estimate of your daily calcium intake, complete the following steps:  1. Use the chart below to determine your daily intake of calcium from diary foods. Servings of dairy per day 1 2 3 4 5 6 7 8  Milligrams (mg) of calcium: 250 500 750 1000 1250 1500 1750 2000   2.  Enter your total daily calcium intake from dairy foods:     _____mg  3.  Add 350 mg, which is the average for all other dietary sources:                 +            350 mg  4.  The sum of your total daily calcium intake:                 ______mg  5.  Enter the recommended calcium intake for your age from the chart below;         ______mg  6.  Enter your daily intake from step 4 above and subtract:                             -        _______mg  7.  The result is how much additional calcium you need:                                          ______mg      Recommended Daily Calcium Intake  Population Calcium (mg)  Children 1-10 years 800-1200  Children 11-24 years 1200-1500  Men and women 25-64 At least 1200  Pregnant/Lactating At least 1200  Postmenopausal women with hormone replacement therapy At least 1200  Postmenopausal women without hormone replacement therapy At least 1200  Men and women 65+ At least 1200       SAFETY TIPS FOR FALL PREVENTION   1. Remove throw rugs and make certain carpet edges are  securely fastened to the floor.  2. Reduce clutter, especially in traffic areas of the home.   3. Install/maintain sturdy handrails at stairs.  4. Increase wattage of lighting in hallways, bathrooms, kitchens, stairwells, and entrances to home.  5. Use night-lights near bed, in hallways, and in bathroom to improve night safety.  6. Install safety handrails in shower, tub, and around toilet.  Bathtubs and shower stalls should have non-skid surfaces.  7. When you must reach for something high, use a safety step stool, one with wide steps and a friction surface to stand on.  A type equipped with a high handrail is preferred.  8. If a cane or other walking aid has been recommended, use it to help increase your stability.  9. Wear supportive, cushioned, low-heeled shoes.  Avoid "scuffs" (backless bedroom slippers) and high heels.  10. Avoid rushing to answer a phone, doorbell, or anything else!  A portable phone that you can take from room to room with you is a good idea for security and safety.  11. Exercise regularly and stay active!!    Resources  National Osteoporosis Foundation www.NOF.org   Exercise for Osteoporosis; A Safe and Effective Way to Build Bone Density and Muscle Strength By: Dianne Daniels, M.A.                                             DO's and DON'T's   Avoid and/or Minimize positions of forward bending ( flexion)  Side bending and rotation of the trunk  Especially when movements occur together   When your back aches:   Don't sit down   Lie down on your back with a small pillow under your head and one under your knees or as outlined by our therapist. Or, lie in the 90/90 position ( on the floor with your feet and legs on the sofa with knees and hips bent to 90 degrees)  Tying or putting on your shoes:   Don't bend over to tie your shoes or put on socks.  Instead, bring one foot up, cross it over the opposite knee and bend   forward (hinge) at the  hips to so the task.  Keep your back straight.  If you cannot do this safely, then you need to use long handled assistive devices such as a shoehorn and sock puller.  Exercising:  Don't engage in ballistic types of exercise routines such as high-impact aerobics or jumping rope  Don't do exercises in the gym that bring you forward (abdominal crunches, sit-ups, touching your  toes, knee-to-chest, straight leg raising.)  Follow a regular exercise program that includes a variety of different weight-bearing activities, such as low-impact aerobics, T' ai chi or walking as your physical therapist advises  Do exercises that emphasize return to normal body alignment and strengthening of the muscles that keep your back straight, as outlined in this program or by your therapist  Household tasks:  Don't reach unnecessarily or twist your trunk when mopping, sweeping, vacuuming, raking, making beds, weeding gardens, getting objects ou of cupboards, etc.  Keep your broom, mop, vacuum, or rake close to you and mover your whole body as you move them. Walk over to the area on which you are working. Arrange kitchen, bathroom, and bedroom shelves so that frequently used items may be reached without excessive bending, twisting, and reaching.  Use a sturdy stool if necessary.  Don't bend from the waist to pick up something up  Off the floor, out of the trunk of your car, or to brush your teeth, wash your face, etc.   Bend at the knees, keeping back straight as possible. Use a reacher if necessary.   Prevention of fracture is the so-called "BOTTOm -Line" in the management of OSTEOPOROSIS. Do not take unnecessary chances in movement. Once a compression fracture occurs, the process is very difficult to control; one fracture is frequently followed by many more.    Other things is to 1.  flip pages in magazine and flip cards with right hand keeping right elbow at your side    2.  Place hand in uncooked rice in  microwave 2 min. And move hand in rice bowl.    3.  Pick up puzzle pieces and place together with keeping right arm at your side.   [facility] [address], [place], [place] [address] Phone # [phone] Fax [phone]

## 2015-02-23 NOTE — Therapy (Signed)
[facility] [address], [place], [place], [address] Phone: [phone]   Fax:  [phone]  Physical Therapy Treatment  Patient Details  Name: Katherine Barnett MRN: [id number] Date of Birth: 12-21-1940 Referring Provider:  Pete Pelt, PA-C  Encounter Date: 02/23/2015      PT End of Session - 02/23/15 0852    Visit Number 4   Number of Visits 10   Date for PT Re-Evaluation 05/13/15  Medicare   PT Start Time 0845   PT Stop Time 0930   PT Time Calculation (min) 45 min   Activity Tolerance Patient tolerated treatment well   Behavior During Therapy [facility] for tasks assessed/performed      Past Medical History  Diagnosis Date  . Hypertension   . Thyroid disease     pt denies this (12/18/14)  . Diabetes mellitus without complication     type 2 - diet controlled  . Complication of anesthesia     vomited after colonoscopy    Past Surgical History  Procedure Laterality Date  . Eye surgery Bilateral     cataract surgery with lens implant  . Colonoscopy    . Orif humerus fracture Right 12/22/2014    Procedure: OPEN REDUCTION INTERNAL FIXATION (ORIF) RIGHT DISTAL HUMERUS FRACTURE;  Surgeon: Mcarthur Rossetti, MD;  Location: [facility];  Service: Orthopedics;  Laterality: Right;    There were no vitals filed for this visit.  Visit Diagnosis:  Shoulder stiffness, right  Elbow stiffness, right  Wrist stiffness, right  Right shoulder pain  Stiffness of right hand joint  Right elbow pain  Edema      Subjective Assessment - 02/23/15 0852    Patient Stated Goals Patient wants to put her bra on, drive, dress, and use right arm for daily tasks   Currently in Pain? Yes   Pain Score 1    Pain Location Arm   Pain Orientation Right   Pain Descriptors / Indicators Tightness;Sore   Pain Type Surgical pain   Pain Onset 1 to 4 weeks ago   Pain Frequency Intermittent   Aggravating Factors  movement   Pain  Relieving Factors rest   Effect of Pain on Daily Activities unable to use right arm for tasks   Multiple Pain Sites No                         [facility] Adult PT Treatment/Exercise - 02/23/15 0001    Elbow Exercises   Elbow Flexion AAROM;Right;15 reps   Elbow Extension AAROM;Right;15 reps   Forearm Supination AAROM;Right;15 reps   Wrist Flexion AAROM;Right;15 reps   Wrist Extension AAROM;Right;10 reps   Shoulder Exercises: Supine   External Rotation AAROM;Right;15 reps  wiht shoulder 0 degree abduction   Flexion AAROM;Right;20 reps   Manual Therapy   Manual Therapy Soft tissue mobilization;Passive ROM   Edema Management Retrograde milliking to fingers and hand   Soft tissue mobilization to right hand, right elbow   Passive ROM right fingers, right wrist, right elbow, right shoulder flexion, right shoulder IR and ER with elbow at side                PT Education - 02/23/15 1021    Education provided Yes   Education Details osteoporosis; reviewed past HEP; using right hand with a magazine,playing cards and puzzles   Person(s) Educated Patient   Methods Explanation;Demonstration;Tactile cues;Verbal cues;Handout   Comprehension Returned demonstration;Verbalized understanding  PT Short Term Goals - 02/22/15 1021    PT SHORT TERM GOAL #1   Title independent with initital HEP for shoulder flexion and elbow ROM   Time 4   Period Weeks   Status Achieved   PT SHORT TERM GOAL #2   Title pain in right shoulder and elbow decreased >/= 25%   Time 4   Period Weeks   Status On-going   PT SHORT TERM GOAL #3   Title right shoulder flexion PROM >/= 115 degrees   Time 4   Period Weeks   Status On-going   PT SHORT TERM GOAL #4   Title right elbow extension >/= -5 degrees   Time 4   Period Weeks   Status On-going  -25   PT SHORT TERM GOAL #5   Title right elbow flexion >/= 110 degrees PROM   Time 4   Period Weeks   Status On-going  100 degrees            PT Long Term Goals - 02/18/15 1013    PT LONG TERM GOAL #1   Title independent with HEP and understand how to progress herself   Time 12   Period Weeks   Status New   PT LONG TERM GOAL #2   Title grip strength on right >/= 30 pounds   Time 12   Period Weeks   Status New   PT LONG TERM GOAL #3   Title ability to put her bra on due to right shoulder AROM within functional limits   Time 12   Period Weeks   Status New   PT LONG TERM GOAL #4   Title return to driving due to right shoulder strength 4/5   Time 12   Period Weeks   Status New   PT LONG TERM GOAL #5   Title able to cook due to right elbow AROM within functional limits   Time 12   Period Weeks   Status New               Plan - 02/23/15 1022    Clinical Impression Statement Patient has less pitting edema in right hand.  Patient is able to grip the small ball with greater ease and increased pressure due to improved mobility of fingers.  Patient has a T score of -4.4 therefore educated her on osteoporosis due to her recent fractures.  Patient will benefit from physical therapy to improve rihgt upper extremity ROM and strength.    Pt will benefit from skilled therapeutic intervention in order to improve on the following deficits Decreased range of motion;Increased fascial restricitons;Impaired UE functional use;Increased muscle spasms;Decreased endurance;Decreased activity tolerance;Pain;Impaired flexibility;Hypomobility;Decreased scar mobility;Decreased mobility;Decreased strength;Increased edema   Rehab Potential Good   Clinical Impairments Affecting Rehab Potential None   PT Frequency 3x / week   PT Duration 12 weeks   PT Treatment/Interventions ADLs/Self Care Home Management;Cryotherapy;Electrical Stimulation;Ultrasound;Moist Heat;Functional mobility training;Therapeutic activities;Therapeutic exercise;Neuromuscular re-education;Manual techniques;Patient/family education;Scar mobilization;Passive range  of motion;Taping   PT Next Visit Plan gentle ROM of right shoulder, right elbow, right wrist, right fingers, NO SHOULDER ABDUCTION, NO LIFTING OVER 5 POUNDS, soft tissue work to right elbow and scar massage   PT Home Exercise Plan wrist isometrics and elbow   Consulted and Agree with Plan of Care Patient        Problem List Patient Active Problem List   Diagnosis Date Noted  . Right supracondylar humerus fracture; right radial head/neck fracture 12/22/2014    Zyasia Halbleib,PT  02/23/2015, 10:25 AM  [facility] [address] [place], [place], [address] Phone: [phone]   Fax:  [phone]

## 2015-03-02 ENCOUNTER — Encounter: Payer: Self-pay | Admitting: Physical Therapy

## 2015-03-02 ENCOUNTER — Ambulatory Visit: Payer: Medicare Other | Admitting: Physical Therapy

## 2015-03-02 DIAGNOSIS — M25611 Stiffness of right shoulder, not elsewhere classified: Secondary | ICD-10-CM | POA: Diagnosis not present

## 2015-03-02 DIAGNOSIS — M25521 Pain in right elbow: Secondary | ICD-10-CM

## 2015-03-02 DIAGNOSIS — M25621 Stiffness of right elbow, not elsewhere classified: Secondary | ICD-10-CM

## 2015-03-02 DIAGNOSIS — M25641 Stiffness of right hand, not elsewhere classified: Secondary | ICD-10-CM

## 2015-03-02 DIAGNOSIS — M25631 Stiffness of right wrist, not elsewhere classified: Secondary | ICD-10-CM

## 2015-03-02 DIAGNOSIS — M25511 Pain in right shoulder: Secondary | ICD-10-CM

## 2015-03-02 DIAGNOSIS — R609 Edema, unspecified: Secondary | ICD-10-CM

## 2015-03-02 NOTE — Therapy (Signed)
[facility] [address] [place], [place], [address] Phone: [phone]   Fax:  [phone]  Physical Therapy Treatment  Patient Details  Name: Katherine Barnett MRN: [id number] Date of Birth: May 09, 1941 Referring Provider:  Pete Pelt, PA-C  Encounter Date: 03/02/2015      PT End of Session - 03/02/15 0847    Visit Number 5   Number of Visits 10  Medicare   Date for PT Re-Evaluation 05/13/15   PT Start Time 0845   PT Stop Time 0925   PT Time Calculation (min) 40 min   Activity Tolerance Patient tolerated treatment well   Behavior During Therapy [facility] for tasks assessed/performed      Past Medical History  Diagnosis Date  . Hypertension   . Thyroid disease     pt denies this (12/18/14)  . Diabetes mellitus without complication     type 2 - diet controlled  . Complication of anesthesia     vomited after colonoscopy    Past Surgical History  Procedure Laterality Date  . Eye surgery Bilateral     cataract surgery with lens implant  . Colonoscopy    . Orif humerus fracture Right 12/22/2014    Procedure: OPEN REDUCTION INTERNAL FIXATION (ORIF) RIGHT DISTAL HUMERUS FRACTURE;  Surgeon: Mcarthur Rossetti, MD;  Location: [place];  Service: Orthopedics;  Laterality: Right;    There were no vitals filed for this visit.  Visit Diagnosis:  Shoulder stiffness, right - Plan: PT plan of care cert/re-cert  Elbow stiffness, right - Plan: PT plan of care cert/re-cert  Wrist stiffness, right - Plan: PT plan of care cert/re-cert  Right shoulder pain - Plan: PT plan of care cert/re-cert  Stiffness of right hand joint - Plan: PT plan of care cert/re-cert  Right elbow pain - Plan: PT plan of care cert/re-cert  Edema - Plan: PT plan of care cert/re-cert          [facility] PT Assessment - 03/02/15 0001    Assessment   Medical Diagnosis s/p ORIF right Supra Condylar Humerus Fracture 6/1/201; Right proimal humerus   fracture   Onset Date/Surgical Date 12/22/14   Hand Dominance Right   Precautions   Precautions Shoulder   Type of Shoulder Precautions No abduction right shoulder, no lifting 5 pounds; gentle ROM right elbow, right forearm, and shoulder   Precaution Comments osteoporosis   PROM   Right Shoulder Flexion 100 Degrees   Right Elbow Flexion 105   Right Elbow Extension -15   Strength   Right Hand Grip (lbs) 5                     [facility] Adult PT Treatment/Exercise - 03/02/15 0001    Elbow Exercises   Elbow Flexion Strengthening;Right;10 reps;Theraband;Supine  2 sets, monitor shoulder for no movement   Theraband Level (Elbow Flexion) Level 1 (Yellow)   Elbow Extension Strengthening;Right;10 reps;Supine;Theraband  2 sets   Theraband Level (Elbow Extension) Level 1 (Yellow)   Elbow Extension Limitations monitor for no shoulder movement   Manual Therapy   Manual Therapy Soft tissue mobilization;Passive ROM;Other (comment)  scar massage on right  elbow surgical site   Edema Management Retrograde milliking to fingers and hand   Soft tissue mobilization to right hand, right elbow   Passive ROM right fingers, right wrist, right elbow, right shoulder flexion, right shoulder IR and ER with elbow at side  PT Education - 03/02/15 [phone]    Education provided Yes   Education Details looking into glove to wear for compression to reduce swelling of right hand   Person(s) Educated Patient   Methods Explanation;Verbal cues   Comprehension Verbalized understanding          PT Short Term Goals - 03/02/15 2725    PT SHORT TERM GOAL #2   Title pain in right shoulder and elbow decreased >/= 25%   Time 4   Period Weeks   Status Achieved   PT SHORT TERM GOAL #3   Title right shoulder flexion PROM >/= 115 degrees   Time 4   Period Weeks   Status On-going  100 degrees   PT SHORT TERM GOAL #4   Title right elbow extension >/= -5 degrees   Time 4   Period  Weeks   Status On-going  -15 degrees   PT SHORT TERM GOAL #5   Title right elbow flexion >/= 110 degrees PROM   Time 4   Period Weeks   Status On-going  100 degrees           PT Long Term Goals - 02/18/15 1013    PT LONG TERM GOAL #1   Title independent with HEP and understand how to progress herself   Time 12   Period Weeks   Status New   PT LONG TERM GOAL #2   Title grip strength on right >/= 30 pounds   Time 12   Period Weeks   Status New   PT LONG TERM GOAL #3   Title ability to put her bra on due to right shoulder AROM within functional limits   Time 12   Period Weeks   Status New   PT LONG TERM GOAL #4   Title return to driving due to right shoulder strength 4/5   Time 12   Period Weeks   Status New   PT LONG TERM GOAL #5   Title able to cook due to right elbow AROM within functional limits   Time 12   Period Weeks   Status New               Plan - 03/02/15 0926    Clinical Impression Statement Patient is a 74 year old female with s/p oRIF right supracondylar humerus fracture and right proximal humerus fracture with precautions of no right shoulder abduction, no lifting over 5 pounds, no joint mobilization due to osteoporosis.  Patient has increased grip strength of 5 pounds.  Patient has increase right shoulder extension to -15 degrees and flexion 105 degrees. right shoulder flexion PROMis 100 degrees.  Patient continues to have swelling in righ tfingers so a compression hand garment would assist in the swelling.  Patinet does no thave pitting edema in righ thand.  Patient would benefit fromp physical therapy to improve righ tupper extremity ROM and strength to restore function.       Pt will benefit from skilled therapeutic intervention in order to improve on the following deficits Decreased range of motion;Increased fascial restricitons;Impaired UE functional use;Increased muscle spasms;Decreased endurance;Decreased activity tolerance;Pain;Impaired  flexibility;Hypomobility;Decreased scar mobility;Decreased mobility;Decreased strength;Increased edema   Rehab Potential Good   Clinical Impairments Affecting Rehab Potential None   PT Frequency 3x / week   PT Duration 12 weeks   PT Treatment/Interventions ADLs/Self Care Home Management;Cryotherapy;Electrical Stimulation;Ultrasound;Moist Heat;Functional mobility training;Therapeutic activities;Therapeutic exercise;Neuromuscular re-education;Manual techniques;Patient/family education;Scar mobilization;Passive range of motion;Taping   PT Next Visit Plan gentle ROM of  right shoulder, right elbow, right wrist, right fingers, NO SHOULDER ABDUCTION, NO LIFTING OVER 5 POUNDS, soft tissue work to right elbow and scar massage   PT Home Exercise Plan wrist isometrics and elbow   Consulted and Agree with Plan of Care Patient        Problem List Patient Active Problem List   Diagnosis Date Noted  . Right supracondylar humerus fracture; right radial head/neck fracture 12/22/2014    GRAY,CHERYL,PT 03/02/2015, 9:34 AM  [facility] [address] [place], [place], [address] Phone: [phone]   Fax:  [phone]

## 2015-03-03 ENCOUNTER — Encounter: Payer: Self-pay | Admitting: Physical Therapy

## 2015-03-03 ENCOUNTER — Ambulatory Visit: Payer: Medicare Other | Admitting: Physical Therapy

## 2015-03-03 DIAGNOSIS — M25641 Stiffness of right hand, not elsewhere classified: Secondary | ICD-10-CM

## 2015-03-03 DIAGNOSIS — M25611 Stiffness of right shoulder, not elsewhere classified: Secondary | ICD-10-CM

## 2015-03-03 DIAGNOSIS — M25511 Pain in right shoulder: Secondary | ICD-10-CM

## 2015-03-03 DIAGNOSIS — R609 Edema, unspecified: Secondary | ICD-10-CM

## 2015-03-03 DIAGNOSIS — M25621 Stiffness of right elbow, not elsewhere classified: Secondary | ICD-10-CM

## 2015-03-03 DIAGNOSIS — M25521 Pain in right elbow: Secondary | ICD-10-CM

## 2015-03-03 DIAGNOSIS — M25631 Stiffness of right wrist, not elsewhere classified: Secondary | ICD-10-CM

## 2015-03-03 NOTE — Patient Instructions (Signed)
SHOULDER: Flexion - Supine (Cane)   Hold cane in both hands. Raise cane to the sky.. Do not allow back to arch. Hold __1_ seconds. __5_ reps per set, _start with 2 x day, build to 3x day.   Copyright  VHI. All rights reserved.

## 2015-03-03 NOTE — Therapy (Signed)
[facility] [address] [place], [place], [address] Phone: [phone]   Fax:  [phone]  Physical Therapy Treatment  Patient Details  Name: Katherine Barnett MRN: [id number] Date of Birth: 01-Dec-1940 Referring Provider:  Pete Pelt, PA-C  Encounter Date: 03/03/2015      PT End of Session - 03/03/15 0914    Visit Number 6   Number of Visits 10   Date for PT Re-Evaluation 05/13/15   PT Start Time 0847   PT Stop Time 0930   PT Time Calculation (min) 43 min   Activity Tolerance Patient tolerated treatment well   Behavior During Therapy [facility] for tasks assessed/performed      Past Medical History  Diagnosis Date  . Hypertension   . Thyroid disease     pt denies this (12/18/14)  . Diabetes mellitus without complication     type 2 - diet controlled  . Complication of anesthesia     vomited after colonoscopy    Past Surgical History  Procedure Laterality Date  . Eye surgery Bilateral     cataract surgery with lens implant  . Colonoscopy    . Orif humerus fracture Right 12/22/2014    Procedure: OPEN REDUCTION INTERNAL FIXATION (ORIF) RIGHT DISTAL HUMERUS FRACTURE;  Surgeon: Mcarthur Rossetti, MD;  Location: [place];  Service: Orthopedics;  Laterality: Right;    There were no vitals filed for this visit.  Visit Diagnosis:  Shoulder stiffness, right  Elbow stiffness, right  Edema  Stiffness of right hand joint  Wrist stiffness, right  Right shoulder pain  Right elbow pain      Subjective Assessment - 03/03/15 0851    Subjective Doing ok, continues to do HEP, putting clothes on getting easier.   Currently in Pain? No/denies   Multiple Pain Sites No                         [facility] Adult PT Treatment/Exercise - 03/03/15 0001    Shoulder Exercises: Supine   Flexion AAROM;Both;5 reps   Other Supine Exercises Scap presses 10x   Other Supine Exercises gripping soft pink ball  2x10  , wrist ext 20x Active, flexion 20x active   Manual Therapy   Manual Therapy Soft tissue mobilization;Passive ROM;Other (comment)  scar massage on right  elbow surgical site   Edema Management Retrograde milliking to fingers and hand   Soft tissue mobilization to right hand, right elbow   Passive ROM right fingers, right wrist, right elbow, right shoulder flexion, right shoulder IR and ER with elbow at side                PT Education - 03/03/15 0922    Education provided Yes   Education Details HEP, supine cane push ups   Person(s) Educated Patient   Methods Explanation;Demonstration;Tactile cues;Verbal cues;Handout   Comprehension Verbalized understanding;Returned demonstration          PT Short Term Goals - 03/02/15 8921    PT SHORT TERM GOAL #2   Title pain in right shoulder and elbow decreased >/= 25%   Time 4   Period Weeks   Status Achieved   PT SHORT TERM GOAL #3   Title right shoulder flexion PROM >/= 115 degrees   Time 4   Period Weeks   Status On-going  100 degrees   PT SHORT TERM GOAL #4   Title right elbow extension >/= -5 degrees  Time 4   Period Weeks   Status On-going  -15 degrees   PT SHORT TERM GOAL #5   Title right elbow flexion >/= 110 degrees PROM   Time 4   Period Weeks   Status On-going  100 degrees           PT Long Term Goals - 02/18/15 1013    PT LONG TERM GOAL #1   Title independent with HEP and understand how to progress herself   Time 12   Period Weeks   Status New   PT LONG TERM GOAL #2   Title grip strength on right >/= 30 pounds   Time 12   Period Weeks   Status New   PT LONG TERM GOAL #3   Title ability to put her bra on due to right shoulder AROM within functional limits   Time 12   Period Weeks   Status New   PT LONG TERM GOAL #4   Title return to driving due to right shoulder strength 4/5   Time 12   Period Weeks   Status New   PT LONG TERM GOAL #5   Title able to cook due to right elbow AROM  within functional limits   Time 12   Period Weeks   Status New               Plan - 03/03/15 6629    Clinical Impression Statement Less resistance with PROM, pt moving all joints of RT UE better. Pt is now dressing and bathing 80% on her own. She is still looking inot glove for her RT hand.    Pt will benefit from skilled therapeutic intervention in order to improve on the following deficits Decreased range of motion;Increased fascial restricitons;Impaired UE functional use;Increased muscle spasms;Decreased endurance;Decreased activity tolerance;Pain;Impaired flexibility;Hypomobility;Decreased scar mobility;Decreased mobility;Decreased strength;Increased edema   Rehab Potential Good   Clinical Impairments Affecting Rehab Potential None   PT Frequency 3x / week   PT Treatment/Interventions ADLs/Self Care Home Management;Cryotherapy;Electrical Stimulation;Ultrasound;Moist Heat;Functional mobility training;Therapeutic activities;Therapeutic exercise;Neuromuscular re-education;Manual techniques;Patient/family education;Scar mobilization;Passive range of motion;Taping   PT Next Visit Plan ROM RT UE, manual techniques   Consulted and Agree with Plan of Care Patient        Problem List Patient Active Problem List   Diagnosis Date Noted  . Right supracondylar humerus fracture; right radial head/neck fracture 12/22/2014    Zayin Valadez, PTA 03/03/2015, 10:54 AM  [facility] [address], [place], [place], [address] Phone: [phone]   Fax:  [phone]

## 2015-03-05 ENCOUNTER — Encounter: Payer: Self-pay | Admitting: Physical Therapy

## 2015-03-05 ENCOUNTER — Ambulatory Visit: Payer: Medicare Other | Admitting: Physical Therapy

## 2015-03-05 DIAGNOSIS — M25611 Stiffness of right shoulder, not elsewhere classified: Secondary | ICD-10-CM

## 2015-03-05 DIAGNOSIS — M25621 Stiffness of right elbow, not elsewhere classified: Secondary | ICD-10-CM

## 2015-03-05 DIAGNOSIS — M25631 Stiffness of right wrist, not elsewhere classified: Secondary | ICD-10-CM

## 2015-03-05 DIAGNOSIS — R609 Edema, unspecified: Secondary | ICD-10-CM

## 2015-03-05 DIAGNOSIS — M25521 Pain in right elbow: Secondary | ICD-10-CM

## 2015-03-05 DIAGNOSIS — M25511 Pain in right shoulder: Secondary | ICD-10-CM

## 2015-03-05 DIAGNOSIS — M25641 Stiffness of right hand, not elsewhere classified: Secondary | ICD-10-CM

## 2015-03-05 NOTE — Therapy (Signed)
[facility] [address] [place], [place], [address] Phone: [phone]   Fax:  [phone]  Physical Therapy Treatment  Patient Details  Name: Katherine Barnett MRN: [id number] Date of Birth: January 02, 1941 Referring Provider:  Pete Pelt, PA-C  Encounter Date: 03/05/2015      PT End of Session - 03/05/15 0924    Visit Number 7   Number of Visits 10  Medicare   Date for PT Re-Evaluation 05/13/15   PT Start Time 0845   PT Stop Time 0925   PT Time Calculation (min) 40 min   Activity Tolerance Patient tolerated treatment well   Behavior During Therapy [facility] for tasks assessed/performed      Past Medical History  Diagnosis Date  . Hypertension   . Thyroid disease     pt denies this (12/18/14)  . Diabetes mellitus without complication     type 2 - diet controlled  . Complication of anesthesia     vomited after colonoscopy    Past Surgical History  Procedure Laterality Date  . Eye surgery Bilateral     cataract surgery with lens implant  . Colonoscopy    . Orif humerus fracture Right 12/22/2014    Procedure: OPEN REDUCTION INTERNAL FIXATION (ORIF) RIGHT DISTAL HUMERUS FRACTURE;  Surgeon: Mcarthur Rossetti, MD;  Location: [place];  Service: Orthopedics;  Laterality: Right;    There were no vitals filed for this visit.  Visit Diagnosis:  Shoulder stiffness, right  Elbow stiffness, right  Edema  Stiffness of right hand joint  Wrist stiffness, right  Right shoulder pain  Right elbow pain      Subjective Assessment - 03/05/15 0850    Subjective I have been looking into gloves.  I bought some from the store.    Patient Stated Goals Patient wants to put her bra on, drive, dress, and use right arm for daily tasks   Currently in Pain? Yes   Pain Score 7    Pain Location Shoulder   Pain Orientation Right   Pain Descriptors / Indicators Tightness   Pain Type Surgical pain   Pain Onset 1 to 4 weeks  ago   Pain Frequency Intermittent   Aggravating Factors  movement   Pain Relieving Factors rest   Effect of Pain on Daily Activities unable to use right arm for tasks   Multiple Pain Sites No                         [facility] Adult PT Treatment/Exercise - 03/05/15 0001    Shoulder Exercises: Supine   External Rotation AROM;Right;15 reps  at 0 degrees abduction   Flexion AROM;Strengthening;Right;15 reps  in painfree range   Manual Therapy   Manual Therapy Soft tissue mobilization;Passive ROM   Edema Management Retrograde milliking to fingers and hand   Soft tissue mobilization to right hand, right elbow   Passive ROM right fingers, right wrist, right elbow, right shoulder flexion, right shoulder IR and ER with elbow at side                PT Education - 03/05/15 0924    Education provided No          PT Short Term Goals - 03/02/15 0921    PT SHORT TERM GOAL #2   Title pain in right shoulder and elbow decreased >/= 25%   Time 4   Period Weeks   Status Achieved  PT SHORT TERM GOAL #3   Title right shoulder flexion PROM >/= 115 degrees   Time 4   Period Weeks   Status On-going  100 degrees   PT SHORT TERM GOAL #4   Title right elbow extension >/= -5 degrees   Time 4   Period Weeks   Status On-going  -15 degrees   PT SHORT TERM GOAL #5   Title right elbow flexion >/= 110 degrees PROM   Time 4   Period Weeks   Status On-going  100 degrees           PT Long Term Goals - 02/18/15 1013    PT LONG TERM GOAL #1   Title independent with HEP and understand how to progress herself   Time 12   Period Weeks   Status New   PT LONG TERM GOAL #2   Title grip strength on right >/= 30 pounds   Time 12   Period Weeks   Status New   PT LONG TERM GOAL #3   Title ability to put her bra on due to right shoulder AROM within functional limits   Time 12   Period Weeks   Status New   PT LONG TERM GOAL #4   Title return to driving due to right  shoulder strength 4/5   Time 12   Period Weeks   Status New   PT LONG TERM GOAL #5   Title able to cook due to right elbow AROM within functional limits   Time 12   Period Weeks   Status New               Plan - 03/05/15 7893    Clinical Impression Statement s/P right ORIF supra condylar humerus fracture 12/23/2014, right proximal humerus fracture.  MD wants gentle ROM right elbow, right forearm, and shoulder.  no abductuction of right shoulder and no lifting more than 5 pounds.  Patient is slowly progressing her ROM.  Patient has pain with going into shoulder extnesion when at 90 degrees flexion due to eccentric weakness. Patient has decreased  swelling in righ thand and no pitting edema.  Patient would benefit fromp hysical therapy to improve ROM and decreased pain.    Pt will benefit from skilled therapeutic intervention in order to improve on the following deficits Decreased range of motion;Increased fascial restricitons;Impaired UE functional use;Increased muscle spasms;Decreased endurance;Decreased activity tolerance;Pain;Impaired flexibility;Hypomobility;Decreased scar mobility;Decreased mobility;Decreased strength;Increased edema   Rehab Potential Good   Clinical Impairments Affecting Rehab Potential None   PT Frequency 3x / week   PT Duration 12 weeks   PT Treatment/Interventions ADLs/Self Care Home Management;Cryotherapy;Electrical Stimulation;Ultrasound;Moist Heat;Functional mobility training;Therapeutic activities;Therapeutic exercise;Neuromuscular re-education;Manual techniques;Patient/family education;Scar mobilization;Passive range of motion;Taping   PT Next Visit Plan ROM RT UE, manual techniques   PT Home Exercise Plan progress as needed   Consulted and Agree with Plan of Care Patient        Problem List Patient Active Problem List   Diagnosis Date Noted  . Right supracondylar humerus fracture; right radial head/neck fracture 12/22/2014     GRAY,CHERYL,PT 03/05/2015, 9:30 AM  [facility] [address] [place], [place], [address] Phone: [phone]   Fax:  [phone]

## 2015-03-08 ENCOUNTER — Ambulatory Visit: Payer: Medicare Other | Admitting: Physical Therapy

## 2015-03-08 ENCOUNTER — Encounter: Payer: Self-pay | Admitting: Physical Therapy

## 2015-03-08 DIAGNOSIS — M25521 Pain in right elbow: Secondary | ICD-10-CM

## 2015-03-08 DIAGNOSIS — R609 Edema, unspecified: Secondary | ICD-10-CM

## 2015-03-08 DIAGNOSIS — M25611 Stiffness of right shoulder, not elsewhere classified: Secondary | ICD-10-CM

## 2015-03-08 DIAGNOSIS — M25631 Stiffness of right wrist, not elsewhere classified: Secondary | ICD-10-CM

## 2015-03-08 DIAGNOSIS — M25621 Stiffness of right elbow, not elsewhere classified: Secondary | ICD-10-CM

## 2015-03-08 DIAGNOSIS — M25511 Pain in right shoulder: Secondary | ICD-10-CM

## 2015-03-08 DIAGNOSIS — M25641 Stiffness of right hand, not elsewhere classified: Secondary | ICD-10-CM

## 2015-03-08 NOTE — Therapy (Signed)
[facility] [address] [place], [place], [address] Phone: [phone]   Fax:  [phone]  Physical Therapy Treatment  Patient Details  Name: Katherine Barnett MRN: [id number] Date of Birth: July 14, 1941 Referring Provider:  [name], MD  Encounter Date: 03/08/2015      PT End of Session - 03/08/15 0904    Visit Number 8   Number of Visits 10   Date for PT Re-Evaluation 05/13/15   PT Start Time 0840   PT Stop Time 0925   PT Time Calculation (min) 45 min   Activity Tolerance Patient tolerated treatment well   Behavior During Therapy [facility] for tasks assessed/performed      Past Medical History  Diagnosis Date  . Hypertension   . Thyroid disease     pt denies this (12/18/14)  . Diabetes mellitus without complication     type 2 - diet controlled  . Complication of anesthesia     vomited after colonoscopy    Past Surgical History  Procedure Laterality Date  . Eye surgery Bilateral     cataract surgery with lens implant  . Colonoscopy    . Orif humerus fracture Right 12/22/2014    Procedure: OPEN REDUCTION INTERNAL FIXATION (ORIF) RIGHT DISTAL HUMERUS FRACTURE;  Surgeon: Mcarthur Rossetti, MD;  Location: [place];  Service: Orthopedics;  Laterality: Right;    There were no vitals filed for this visit.  Visit Diagnosis:  Shoulder stiffness, right  Elbow stiffness, right  Edema  Stiffness of right hand joint  Wrist stiffness, right  Right shoulder pain  Right elbow pain      Subjective Assessment - 03/08/15 0841    Subjective pt reports improvement with her range of motion in left arm, but no motion into forearm supination   Currently in Pain? Yes  no pain with rest, but with arm movement   Pain Score 9   working with the fingers up to 10/10   Pain Location Shoulder   Pain Orientation Right   Pain Descriptors / Indicators Tightness   Pain Type Surgical pain   Pain Onset More than a  month ago   Pain Frequency Intermittent   Aggravating Factors  Rt UE movement, esspecially activities with fingers, unable to grip with right hand   Effect of Pain on Daily Activities unable to use Right hand or Right arm for functional activities                         [facility] Adult PT Treatment/Exercise - 03/08/15 0001    Shoulder Exercises: Supine   External Rotation AROM;Right;15 reps   Flexion AROM;Strengthening;Right;15 reps  in sitting and supine   Other Supine Exercises Velcro board for pro/sup & flex/ext   Other Supine Exercises gripping soft pink ball 2 x 24min   Manual Therapy   Manual Therapy Soft tissue mobilization;Passive ROM  to Rt UE, forearm    Edema Management Retrograde milliking to fingers and hand   Soft tissue mobilization to right hand, right elbow   Passive ROM right fingers, right wrist, right elbow, right shoulder flexion, right shoulder IR and ER with elbow at side                  PT Short Term Goals - 03/08/15 0910    PT SHORT TERM GOAL #1   Title independent with initital HEP for shoulder flexion and elbow ROM   Time 4  Period Weeks   Status Achieved   PT SHORT TERM GOAL #2   Title pain in right shoulder and elbow decreased >/= 25%   Time 4   Period Weeks   Status Achieved   PT SHORT TERM GOAL #3   Title right shoulder flexion PROM >/= 115 degrees   Time 4   Period Weeks   Status On-going   PT SHORT TERM GOAL #4   Title right elbow extension >/= -5 degrees   Time 4   Period Weeks   Status On-going   PT SHORT TERM GOAL #5   Title right elbow flexion >/= 110 degrees PROM   Time 4   Period Weeks   Status On-going           PT Long Term Goals - 03/08/15 0911    PT LONG TERM GOAL #1   Title independent with HEP and understand how to progress herself   Time 12   Period Weeks   Status On-going   PT LONG TERM GOAL #2   Title grip strength on right >/= 30 pounds   Time 12   Period Weeks   Status On-going    PT LONG TERM GOAL #3   Title ability to put her bra on due to right shoulder AROM within functional limits   Time 12   Period Weeks   Status On-going   PT LONG TERM GOAL #4   Title return to driving due to right shoulder strength 4/5   Time 12   Period Weeks   Status On-going   PT LONG TERM GOAL #5   Title able to cook due to right elbow AROM within functional limits   Time 12   Period Weeks   Status On-going               Plan - 03/08/15 0904    Clinical Impression Statement s/p right ORIF supra condylar humerus fracture 12/23/2014. right proximal fracture. MD wants gentle ROM right elbow, right forearm and shoulder, no abduction of Right shoulder and no lifting more than 5 pounds. Pt is progressing with her ROM. Pt will continue to benfit from skilled PT   Pt will benefit from skilled therapeutic intervention in order to improve on the following deficits Decreased range of motion;Increased fascial restricitons;Impaired UE functional use;Increased muscle spasms;Decreased endurance;Decreased activity tolerance;Pain;Impaired flexibility;Hypomobility;Decreased scar mobility;Decreased mobility;Decreased strength;Increased edema   Rehab Potential Good   PT Frequency 3x / week   PT Duration 12 weeks   PT Next Visit Plan ROM RT UE, manual techniques   PT Home Exercise Plan progress as needed   Consulted and Agree with Plan of Care Patient        Problem List Patient Active Problem List   Diagnosis Date Noted  . Right supracondylar humerus fracture; right radial head/neck fracture 12/22/2014    NAUMANN-HOUEGNIFIO,Narciso Stoutenburg PTA 03/08/2015, 9:35 AM  [facility] [address], [place], [place], [address] Phone: [phone]   Fax:  [phone]

## 2015-03-10 ENCOUNTER — Encounter: Payer: Self-pay | Admitting: Physical Therapy

## 2015-03-10 ENCOUNTER — Ambulatory Visit: Payer: Medicare Other | Admitting: Physical Therapy

## 2015-03-10 DIAGNOSIS — M25631 Stiffness of right wrist, not elsewhere classified: Secondary | ICD-10-CM

## 2015-03-10 DIAGNOSIS — M25611 Stiffness of right shoulder, not elsewhere classified: Secondary | ICD-10-CM | POA: Diagnosis not present

## 2015-03-10 DIAGNOSIS — M25641 Stiffness of right hand, not elsewhere classified: Secondary | ICD-10-CM

## 2015-03-10 DIAGNOSIS — R609 Edema, unspecified: Secondary | ICD-10-CM

## 2015-03-10 DIAGNOSIS — M25521 Pain in right elbow: Secondary | ICD-10-CM

## 2015-03-10 DIAGNOSIS — M25511 Pain in right shoulder: Secondary | ICD-10-CM

## 2015-03-10 DIAGNOSIS — M25621 Stiffness of right elbow, not elsewhere classified: Secondary | ICD-10-CM

## 2015-03-10 NOTE — Therapy (Signed)
[facility] [address] [place], [place], [address] Phone: [phone]   Fax:  [phone]  Physical Therapy Treatment  Patient Details  Name: Katherine Barnett MRN: [id number] Date of Birth: March 01, 1941 Referring Provider:  Pete Pelt, PA-C  Encounter Date: 03/10/2015      PT End of Session - 03/10/15 1016    Visit Number 9   Number of Visits 10   Date for PT Re-Evaluation 05/13/15   PT Start Time 0846   PT Stop Time 0929   PT Time Calculation (min) 43 min   Activity Tolerance Patient tolerated treatment well   Behavior During Therapy [facility] for tasks assessed/performed      Past Medical History  Diagnosis Date  . Hypertension   . Thyroid disease     pt denies this (12/18/14)  . Diabetes mellitus without complication     type 2 - diet controlled  . Complication of anesthesia     vomited after colonoscopy    Past Surgical History  Procedure Laterality Date  . Eye surgery Bilateral     cataract surgery with lens implant  . Colonoscopy    . Orif humerus fracture Right 12/22/2014    Procedure: OPEN REDUCTION INTERNAL FIXATION (ORIF) RIGHT DISTAL HUMERUS FRACTURE;  Surgeon: Mcarthur Rossetti, MD;  Location: [facility];  Service: Orthopedics;  Laterality: Right;    There were no vitals filed for this visit.  Visit Diagnosis:  Shoulder stiffness, right  Elbow stiffness, right  Edema  Stiffness of right hand joint  Wrist stiffness, right  Right shoulder pain  Right elbow pain      Subjective Assessment - 03/10/15 0911    Subjective Pt reports no complain of pain with rest, but with movement rated as 2/10, unable to flex the fingers due to edema in fingers and forearm   Currently in Pain? Yes   Pain Score 2   working with fingers causes today pain up to 5/10   Pain Location Shoulder   Pain Orientation Right   Pain Descriptors / Indicators Tightness   Pain Type Surgical pain   Pain Onset  More than a month ago   Pain Frequency Intermittent   Multiple Pain Sites No                         [facility] Adult PT Treatment/Exercise - 03/10/15 0001    Exercises   Exercises Wrist   Shoulder Exercises: Supine   External Rotation AROM;Right;15 reps   Flexion AROM;Strengthening;Right;15 reps  x 4 in sitting and supine   Other Supine Exercises Velcro board for pro/sup & flex/ext wrist  1 min needed for 1 samll side of board with flex/ext   Other Supine Exercises gripping soft pink ball 2 x 54min   Hand Exercises   Digit Composite ABduction AROM;Seated;Limitations  2 x 76min   Other Hand Exercises spider web fingerflexion 2x 1 min   Manual Therapy   Manual Therapy Soft tissue mobilization;Passive ROM  to Rt forearm in supine    Edema Management Retrograde milliking to fingers and hand   Soft tissue mobilization to right hand, right elbow   Passive ROM right fingers, right wrist, right elbow, right shoulder flexion, right shoulder IR and ER with elbow at side                  PT Short Term Goals - 03/08/15 0910    PT SHORT TERM GOAL #  1   Title independent with initital HEP for shoulder flexion and elbow ROM   Time 4   Period Weeks   Status Achieved   PT SHORT TERM GOAL #2   Title pain in right shoulder and elbow decreased >/= 25%   Time 4   Period Weeks   Status Achieved   PT SHORT TERM GOAL #3   Title right shoulder flexion PROM >/= 115 degrees   Time 4   Period Weeks   Status On-going   PT SHORT TERM GOAL #4   Title right elbow extension >/= -5 degrees   Time 4   Period Weeks   Status On-going   PT SHORT TERM GOAL #5   Title right elbow flexion >/= 110 degrees PROM   Time 4   Period Weeks   Status On-going           PT Long Term Goals - 03/08/15 0911    PT LONG TERM GOAL #1   Title independent with HEP and understand how to progress herself   Time 12   Period Weeks   Status On-going   PT LONG TERM GOAL #2   Title grip strength  on right >/= 30 pounds   Time 12   Period Weeks   Status On-going   PT LONG TERM GOAL #3   Title ability to put her bra on due to right shoulder AROM within functional limits   Time 12   Period Weeks   Status On-going   PT LONG TERM GOAL #4   Title return to driving due to right shoulder strength 4/5   Time 12   Period Weeks   Status On-going   PT LONG TERM GOAL #5   Title able to cook due to right elbow AROM within functional limits   Time 12   Period Weeks   Status On-going               Plan - 03/10/15 1017    Clinical Impression Statement s/p right ORIF supra condylar humerus fracture 12/23/2014., Right proximal fracture. MD wants gentle ROM right elbow, right forearm and shoulder, no abduction and no lifting more than 5#. Pt will continue  to benefit from skilled PT   Rehab Potential Good   PT Frequency 3x / week   PT Duration 12 weeks   PT Treatment/Interventions ADLs/Self Care Home Management;Cryotherapy;Electrical Stimulation;Ultrasound;Moist Heat;Functional mobility training;Therapeutic activities;Therapeutic exercise;Neuromuscular re-education;Manual techniques;Patient/family education;Scar mobilization;Passive range of motion;Taping   PT Next Visit Plan ROM RT UE, manual techniques   Consulted and Agree with Plan of Care Patient        Problem List Patient Active Problem List   Diagnosis Date Noted  . Right supracondylar humerus fracture; right radial head/neck fracture 12/22/2014    NAUMANN-HOUEGNIFIO,Kelcee Bjorn PTA 03/10/2015, 10:20 AM  [facility] [address], [place], [place], [address] Phone: [phone]   Fax:  [phone]

## 2015-03-12 ENCOUNTER — Ambulatory Visit: Payer: Medicare Other | Admitting: Physical Therapy

## 2015-03-12 ENCOUNTER — Encounter: Payer: Self-pay | Admitting: Physical Therapy

## 2015-03-12 DIAGNOSIS — M25521 Pain in right elbow: Secondary | ICD-10-CM

## 2015-03-12 DIAGNOSIS — M25641 Stiffness of right hand, not elsewhere classified: Secondary | ICD-10-CM

## 2015-03-12 DIAGNOSIS — M25611 Stiffness of right shoulder, not elsewhere classified: Secondary | ICD-10-CM | POA: Diagnosis not present

## 2015-03-12 DIAGNOSIS — M25621 Stiffness of right elbow, not elsewhere classified: Secondary | ICD-10-CM

## 2015-03-12 DIAGNOSIS — R609 Edema, unspecified: Secondary | ICD-10-CM

## 2015-03-12 DIAGNOSIS — M25631 Stiffness of right wrist, not elsewhere classified: Secondary | ICD-10-CM

## 2015-03-12 DIAGNOSIS — M25511 Pain in right shoulder: Secondary | ICD-10-CM

## 2015-03-12 NOTE — Therapy (Signed)
[facility] [address] [place], [place], [address] Phone: [phone]   Fax:  [phone]  Physical Therapy Treatment  Patient Details  Name: Katherine Barnett MRN: [id number] Date of Birth: [birth year] Referring Provider:  Carney Bern, PA  Encounter Date: 03/12/2015      PT End of Session - 03/12/15 0927    Visit Number 10   Number of Visits 10   Date for PT Re-Evaluation 05/13/15   PT Start Time 0843   PT Stop Time 0929   PT Time Calculation (min) 46 min   Activity Tolerance Patient tolerated treatment well   Behavior During Therapy [facility] for tasks assessed/performed      Past Medical History  Diagnosis Date  . Hypertension   . Thyroid disease     pt denies this (12/18/14)  . Diabetes mellitus without complication     type 2 - diet controlled  . Complication of anesthesia     vomited after colonoscopy    Past Surgical History  Procedure Laterality Date  . Eye surgery Bilateral     cataract surgery with lens implant  . Colonoscopy    . Orif humerus fracture Right 12/22/2014    Procedure: OPEN REDUCTION INTERNAL FIXATION (ORIF) RIGHT DISTAL HUMERUS FRACTURE;  Surgeon: Mcarthur Rossetti, MD;  Location: [facility];  Service: Orthopedics;  Laterality: Right;    There were no vitals filed for this visit.  Visit Diagnosis:  Shoulder stiffness, right  Elbow stiffness, right  Edema  Stiffness of right hand joint  Wrist stiffness, right  Right shoulder pain  Right elbow pain      Subjective Assessment - 03/12/15 0906    Subjective No complains of pain with rest but with movement rated as 2/10. Pt's has a difficult time to sleep since she usually sleeps on her Rt side. Pt unable to flex the fingers due to edema in fingers and forearm.   Currently in Pain? Yes   Pain Score 2    Pain Location Shoulder   Pain Orientation Right   Pain Descriptors / Indicators Tightness   Pain Type Surgical pain    Pain Onset More than a month ago   Pain Frequency Intermittent   Multiple Pain Sites No            [facility] PT Assessment - 03/12/15 0001    Assessment   Medical Diagnosis s/p ORIF right Supra Condylar Humerus Fracture 6/1/201; Right proimal humerus  fracture   Onset Date/Surgical Date 12/22/14   Hand Dominance Right   Precautions   Precautions Shoulder   Type of Shoulder Precautions No abduction right shoulder, no lifting 5 pounds; gentle ROM right elbow, right forearm, and shoulder   Precaution Comments osteoporosis   Balance Screen   Has the patient fallen in the past 6 months Yes   How many times? 1   Has the patient had a decrease in activity level because of a fear of falling?  No   Is the patient reluctant to leave their home because of a fear of falling?  No   Prior Function   Level of Independence Needs assistance with ADLs;Needs assistance with homemaking;Requires assistive device for independence   Dressing Minimal   Grooming Moderate   Meal Prep Maximal   Light Housekeeping Minimal   Driving No   Observation/Other Assessments   Skin Integrity decreased mobility of scar on posterior right elbow   Focus on Therapeutic Outcomes (FOTO)  69% limitation  Observation/Other Assessments-Edema    Edema --  right hand has edema   Coordination   Fine Motor Movements are Fluid and Coordinated No   ROM / Strength   AROM / PROM / Strength AROM   AROM   AROM Assessment Site Shoulder   Right/Left Shoulder Right   Right Shoulder Extension 25 Degrees   Right Shoulder Flexion 28 Degrees   Right Shoulder Internal Rotation 55 Degrees   Right Shoulder External Rotation -20 Degrees   Right/Left Elbow Right   Right Elbow Flexion 90   Right Elbow Extension 145   Right/Left Forearm Right   Right Forearm Pronation 90 Degrees   Right Forearm Supination 3 Degrees   Right/Left Wrist Right   Right Wrist Extension 25 Degrees   Right Wrist Flexion 35 Degrees   Right/Left Finger Right    Right Composite Finger Flexion 25%   PROM   PROM Assessment Site Shoulder   Right/Left Shoulder Right   Right Shoulder Flexion 100 Degrees   Right Shoulder Internal Rotation 50 Degrees  in 0 degrees abduction   Right Shoulder External Rotation 5 Degrees  in 0 degrees    Right/Left Elbow Right   Right Elbow Flexion 105   Right Elbow Extension -15   Right/Left Forearm Right   Right Forearm Supination 10 Degrees   Right/Left Wrist Right   Right Wrist Extension 50 Degrees   Right Wrist Flexion 60 Degrees   Right Wrist Radial Deviation 52 Degrees   Strength   Overall Strength Unable to assess   Strength Assessment Site Hand   Right/Left hand Right   Right Hand Grip (lbs) 5   Right Hand Lateral Pinch --  with thumb and index finger only                     [facility] Adult PT Treatment/Exercise - 03/12/15 0001    Exercises   Exercises Wrist   Shoulder Exercises: Supine   External Rotation AROM;Right;15 reps  with arm at side of body   Flexion AROM;Strengthening;Right;15 reps  x4 in sitting and supine   Other Supine Exercises Velcro board for pro/sup & flex/ext wrist   Other Supine Exercises gripping soft pink ball 2 x 56mn   Hand Exercises   Digit Composite ABduction AROM;Seated;Limitations  2 x 1 min   Other Hand Exercises spider web fingerflexion 2x 1 min   Manual Therapy   Manual Therapy Soft tissue mobilization;Passive ROM   Edema Management Retrograde milliking to fingers and hand   Soft tissue mobilization to right hand, right elbow   Passive ROM right fingers, right wrist, right elbow, right shoulder flexion, right shoulder IR and ER with elbow at side                  PT Short Term Goals - 03/12/15 1003    PT SHORT TERM GOAL #1   Title independent with initital HEP for shoulder flexion and elbow ROM   Time 4   Period Weeks   Status Achieved   PT SHORT TERM GOAL #2   Title pain in right shoulder and elbow decreased >/= 25%   Time 4    Period Weeks   Status Achieved   PT SHORT TERM GOAL #3   Title right shoulder flexion PROM >/= 115 degrees   Time 4   Period Weeks   Status On-going   PT SHORT TERM GOAL #4   Title right elbow extension >/= -5 degrees   Time  4   Period Weeks   Status On-going   PT SHORT TERM GOAL #5   Title right elbow flexion >/= 110 degrees PROM   Time 4   Period Weeks   Status On-going           PT Long Term Goals - 03/08/15 0911    PT LONG TERM GOAL #1   Title independent with HEP and understand how to progress herself   Time 12   Period Weeks   Status On-going   PT LONG TERM GOAL #2   Title grip strength on right >/= 30 pounds   Time 12   Period Weeks   Status On-going   PT LONG TERM GOAL #3   Title ability to put her bra on due to right shoulder AROM within functional limits   Time 12   Period Weeks   Status On-going   PT LONG TERM GOAL #4   Title return to driving due to right shoulder strength 4/5   Time 12   Period Weeks   Status On-going   PT LONG TERM GOAL #5   Title able to cook due to right elbow AROM within functional limits   Time 12   Period Weeks   Status On-going               Plan - 03/12/15 0927    Clinical Impression Statement Pt s/p right ORIF supra condylar humerus fracture 12/23/2014. Right proximal fracture humerus fracture. MD wants gentle ROM right elbow, right forearm and shoulder, no abduction and no lifting more than 5#. pt will continue to benefit from skilled PT   Pt will benefit from skilled therapeutic intervention in order to improve on the following deficits Decreased range of motion;Increased fascial restricitons;Impaired UE functional use;Increased muscle spasms;Decreased endurance;Decreased activity tolerance;Pain;Impaired flexibility;Hypomobility;Decreased scar mobility;Decreased mobility;Decreased strength;Increased edema   Rehab Potential Good   PT Frequency 3x / week   PT Duration 12 weeks   PT Treatment/Interventions ADLs/Self  Care Home Management;Cryotherapy;Electrical Stimulation;Ultrasound;Moist Heat;Functional mobility training;Therapeutic activities;Therapeutic exercise;Neuromuscular re-education;Manual techniques;Patient/family education;Scar mobilization;Passive range of motion;Taping   Consulted and Agree with Plan of Care Patient        Problem List Patient Active Problem List   Diagnosis Date Noted  . Right supracondylar humerus fracture; right radial head/neck fracture 12/22/2014     Chaysen Tillman Naumann-Houegnifio, PTA 03/12/2015 10:05 AM  [facility] [address], [place], [place], [address] Phone: [phone]   Fax:  [phone]  Physical Therapy Progress Note  Dates of Reporting Period: 02/18/2015  to 03/12/2015  Objective Reports of Subjective Statement: Patient reports she is 40% better overall. Patient has difficulty with sleeping due to pain.   Objective Measurements: FOTO score improved to 69% limitation. Decreased mobility of scar on right elbow.  Right shoulder AROM/PROM in degrees: flexion 28/100, internal rotation at 0 degrees abduction 50/55, external rotation at 0 degrees abduction is -20/9.  Right elbow AROM/PROM in degrees: flexion 90/105, extension 145/-15, and supination 3/10. Right wrist AROM/PROM in degrees: extension 25/50 and flexion 35/60. Grip strength on right is 5 pounds.  Patient continues to have swelling in right fingers but swelling in hand has decreased.   Goal Update: STG #1 and 2 has been met. All other goals have not been met due to decreased ROM and strength of right upper extremity.   Plan: Continue physical therapy 3x/week for 8 weeks.   Reason Skilled Services are Required: to progress right shoulder, elbow, and  hand ROM and strength according to MD guideline and due to her osteoporosis.   Earlie Counts, PT 03/12/2015 10:55 AM

## 2015-03-15 ENCOUNTER — Encounter: Payer: Self-pay | Admitting: Physical Therapy

## 2015-03-15 ENCOUNTER — Ambulatory Visit: Payer: Medicare Other | Admitting: Physical Therapy

## 2015-03-15 DIAGNOSIS — M25511 Pain in right shoulder: Secondary | ICD-10-CM

## 2015-03-15 DIAGNOSIS — M25521 Pain in right elbow: Secondary | ICD-10-CM

## 2015-03-15 DIAGNOSIS — M25641 Stiffness of right hand, not elsewhere classified: Secondary | ICD-10-CM

## 2015-03-15 DIAGNOSIS — R609 Edema, unspecified: Secondary | ICD-10-CM

## 2015-03-15 DIAGNOSIS — M25621 Stiffness of right elbow, not elsewhere classified: Secondary | ICD-10-CM

## 2015-03-15 DIAGNOSIS — M25611 Stiffness of right shoulder, not elsewhere classified: Secondary | ICD-10-CM

## 2015-03-15 DIAGNOSIS — M25631 Stiffness of right wrist, not elsewhere classified: Secondary | ICD-10-CM

## 2015-03-15 NOTE — Therapy (Signed)
[facility] [address], [place], [place], [address] Phone: [phone]   Fax:  [phone]  Physical Therapy Treatment  Patient Details  Name: Katherine Barnett MRN: [id number] Date of Birth: 02-22-1941 Referring Provider:  Pete Pelt, PA-C  Encounter Date: 03/15/2015      PT End of Session - 03/15/15 0909    Visit Number 11   Number of Visits 20   Date for PT Re-Evaluation 05/13/15   PT Start Time 0845   PT Stop Time 0929   PT Time Calculation (min) 44 min   Activity Tolerance Patient tolerated treatment well   Behavior During Therapy [facility] for tasks assessed/performed      Past Medical History  Diagnosis Date  . Hypertension   . Thyroid disease     pt denies this (12/18/14)  . Diabetes mellitus without complication     type 2 - diet controlled  . Complication of anesthesia     vomited after colonoscopy    Past Surgical History  Procedure Laterality Date  . Eye surgery Bilateral     cataract surgery with lens implant  . Colonoscopy    . Orif humerus fracture Right 12/22/2014    Procedure: OPEN REDUCTION INTERNAL FIXATION (ORIF) RIGHT DISTAL HUMERUS FRACTURE;  Surgeon: Mcarthur Rossetti, MD;  Location: [place];  Service: Orthopedics;  Laterality: Right;    There were no vitals filed for this visit.  Visit Diagnosis:  Shoulder stiffness, right  Elbow stiffness, right  Edema  Stiffness of right hand joint  Wrist stiffness, right  Right shoulder pain  Right elbow pain      Subjective Assessment - 03/15/15 0856    Subjective No pain in Right elbow with rest but elbow feels, shoulder and neck feels stiff rated as 5/10. Sleeping is still difficult due to unable to sleep on Rt side, she needs to take meds.     Currently in Pain? Yes   Pain Score 5   not pain but stiffness rated as 5/10   Pain Location Shoulder   Pain Orientation Right   Pain Descriptors / Indicators Tightness   Pain  Type Surgical pain   Pain Onset More than a month ago   Pain Frequency Intermittent   Aggravating Factors  Rt upper movement, esspecially activities with fingers, unable to grip with right hand    Pain Relieving Factors rest   Effect of Pain on Daily Activities unable to use right hand or right arm for functional activities   Multiple Pain Sites No            [facility] PT Assessment - 03/15/15 0001    AROM   AROM Assessment Site Shoulder   Right/Left Shoulder Right   Right Shoulder Extension 35 Degrees   Right Shoulder Flexion 60 Degrees   Right Shoulder Internal Rotation 70 Degrees   Right Shoulder External Rotation 15 Degrees   Right/Left Elbow Right   Right Elbow Flexion 90   Right Elbow Extension -15   Right/Left Forearm Right   Right Forearm Pronation 90 Degrees   Right Forearm Supination 10 Degrees   Right/Left Wrist Right   Right Wrist Extension 40 Degrees   Right Wrist Flexion 35 Degrees   Strength   Overall Strength Unable to assess   Strength Assessment Site Hand   Right/Left hand Right   Right Hand Grip (lbs) 5   Right Hand Lateral Pinch --  with thumb and index finger only  [facility] Adult PT Treatment/Exercise - 03/15/15 0001    Exercises   Exercises Wrist   Shoulder Exercises: Supine   External Rotation AROM;Right;15 reps  x2   Flexion AROM;Strengthening;Right;15 reps  x4 in sitting and supine   Other Supine Exercises Velcro board for pro/sup & flex/ext wrist   Other Supine Exercises gripping soft pink ball 2 x 25min   Hand Exercises   Digit Composite ABduction AROM;Seated;Limitations  2 x 1 min   Manual Therapy   Manual Therapy Soft tissue mobilization;Passive ROM  to Rt shoulder    Edema Management Retrograde milliking to fingers and hand   Soft tissue mobilization to right hand, right elbow   Passive ROM right fingers, right wrist, right elbow, right shoulder flexion, right shoulder IR and ER with elbow at side                   PT Short Term Goals - 03/15/15 0950    PT SHORT TERM GOAL #1   Title independent with initital HEP for shoulder flexion and elbow ROM   Time 4   Period Weeks   Status Achieved   PT SHORT TERM GOAL #2   Title pain in right shoulder and elbow decreased >/= 25%   Time 4   Period Weeks   Status Achieved   PT SHORT TERM GOAL #3   Title right shoulder flexion PROM >/= 115 degrees   Time 4   Period Weeks   Status On-going   PT SHORT TERM GOAL #4   Title right elbow extension >/= -5 degrees  Rt elbow ext -15 as of 03/15/15   Time 4   Period Weeks   Status On-going   PT SHORT TERM GOAL #5   Title right elbow flexion >/= 110 degrees PROM   Time 4   Period Weeks   Status On-going           PT Long Term Goals - 03/08/15 0911    PT LONG TERM GOAL #1   Title independent with HEP and understand how to progress herself   Time 12   Period Weeks   Status On-going   PT LONG TERM GOAL #2   Title grip strength on right >/= 30 pounds   Time 12   Period Weeks   Status On-going   PT LONG TERM GOAL #3   Title ability to put her bra on due to right shoulder AROM within functional limits   Time 12   Period Weeks   Status On-going   PT LONG TERM GOAL #4   Title return to driving due to right shoulder strength 4/5   Time 12   Period Weeks   Status On-going   PT LONG TERM GOAL #5   Title able to cook due to right elbow AROM within functional limits   Time 12   Period Weeks   Status On-going               Plan - 03/15/15 0910    Clinical Impression Statement Pt s/p right ORIF condylar humerus fracture 12/23/2014. Right proximal humerus fracture. MD wants gentle ROM right elbow, right forearm and shoulder , no abduction and no lifting more than 5#. Pt will continue to benefit from skilled PT    Pt will benefit from skilled therapeutic intervention in order to improve on the following deficits Decreased range of motion;Increased fascial  restricitons;Impaired UE functional use;Increased muscle spasms;Decreased endurance;Decreased activity tolerance;Pain;Impaired flexibility;Hypomobility;Decreased scar mobility;Decreased mobility;Decreased strength;Increased edema  Rehab Potential Good   PT Frequency 3x / week   PT Duration 12 weeks   PT Treatment/Interventions ADLs/Self Care Home Management;Cryotherapy;Electrical Stimulation;Ultrasound;Moist Heat;Functional mobility training;Therapeutic activities;Therapeutic exercise;Neuromuscular re-education;Manual techniques;Patient/family education;Scar mobilization;Passive range of motion;Taping   PT Next Visit Plan Take ROM measurment for MD note, use Foto score from Friday 03/12/15. continue with ROM RT UE, manual techniques    PT Home Exercise Plan progress as needed   Consulted and Agree with Plan of Care Patient        Problem List Patient Active Problem List   Diagnosis Date Noted  . Right supracondylar humerus fracture; right radial head/neck fracture 12/22/2014    NAUMANN-HOUEGNIFIO,Biance Moncrief PTA 03/15/2015, 10:14 AM  [facility] [address], [place], [place], [address] Phone: [phone]   Fax:  [phone]

## 2015-03-17 ENCOUNTER — Encounter: Payer: Self-pay | Admitting: Physical Therapy

## 2015-03-17 ENCOUNTER — Ambulatory Visit: Payer: Medicare Other | Admitting: Physical Therapy

## 2015-03-17 DIAGNOSIS — M25521 Pain in right elbow: Secondary | ICD-10-CM

## 2015-03-17 DIAGNOSIS — M25611 Stiffness of right shoulder, not elsewhere classified: Secondary | ICD-10-CM | POA: Diagnosis not present

## 2015-03-17 DIAGNOSIS — M25621 Stiffness of right elbow, not elsewhere classified: Secondary | ICD-10-CM

## 2015-03-17 DIAGNOSIS — M25641 Stiffness of right hand, not elsewhere classified: Secondary | ICD-10-CM

## 2015-03-17 DIAGNOSIS — M25511 Pain in right shoulder: Secondary | ICD-10-CM

## 2015-03-17 DIAGNOSIS — M25631 Stiffness of right wrist, not elsewhere classified: Secondary | ICD-10-CM

## 2015-03-17 DIAGNOSIS — R609 Edema, unspecified: Secondary | ICD-10-CM

## 2015-03-17 NOTE — Therapy (Signed)
[facility] [address], [place], [place], [address] Phone: [phone]   Fax:  [phone]  Physical Therapy Treatment  Patient Details  Name: Katherine Barnett MRN: [id number] Date of Birth: 04-23-1941 Referring Provider:  Pete Pelt, PA-C  Encounter Date: 03/17/2015      PT End of Session - 03/17/15 1203    Visit Number 12   Number of Visits 20   Date for PT Re-Evaluation 05/13/15   PT Start Time 0844   PT Stop Time 0930   PT Time Calculation (min) 46 min   Activity Tolerance Patient tolerated treatment well      Past Medical History  Diagnosis Date  . Hypertension   . Thyroid disease     pt denies this (12/18/14)  . Diabetes mellitus without complication     type 2 - diet controlled  . Complication of anesthesia     vomited after colonoscopy    Past Surgical History  Procedure Laterality Date  . Eye surgery Bilateral     cataract surgery with lens implant  . Colonoscopy    . Orif humerus fracture Right 12/22/2014    Procedure: OPEN REDUCTION INTERNAL FIXATION (ORIF) RIGHT DISTAL HUMERUS FRACTURE;  Surgeon: Mcarthur Rossetti, MD;  Location: [place];  Service: Orthopedics;  Laterality: Right;    There were no vitals filed for this visit.  Visit Diagnosis:  Shoulder stiffness, right  Elbow stiffness, right  Edema  Stiffness of right hand joint  Wrist stiffness, right  Right shoulder pain  Right elbow pain      Subjective Assessment - 03/17/15 1136    Subjective No pain with rest in right elbow and wrist, but pain with movement into available flexion of fingers. Sleeping is still difficult due to unable to sleep on Rt side and she needs to take meds to help her sleep.   Currently in Pain? Yes   Pain Score 5   no pain with rest, but with movement   Pain Location Shoulder   Pain Orientation Right   Pain Descriptors / Indicators Tightness   Pain Type Surgical pain   Pain Onset  More than a month ago   Pain Frequency Intermittent   Aggravating Factors  Movement with Right arm, flexion of fingers in available range, unable to grip with right hand   Pain Relieving Factors rest   Effect of Pain on Daily Activities unable to use right hand or right arm for functional activities   Multiple Pain Sites No            [facility] PT Assessment - 03/17/15 0001    Observation/Other Assessments   Skin Integrity decreased mobility of scar on posterior right elbow   Focus on Therapeutic Outcomes (FOTO)  69% limitation  as of August 19th, 2016   Observation/Other Assessments-Edema    Edema --  Right hand and finger edema   Coordination   Fine Motor Movements are Fluid and Coordinated No  unable to perform due to swelling and pain   ROM / Strength   AROM / PROM / Strength AROM   AROM   AROM Assessment Site Shoulder   Right/Left Shoulder Right   Right Shoulder Extension 35 Degrees   Right Shoulder Flexion 60 Degrees   Right Shoulder Internal Rotation 70 Degrees   Right Shoulder External Rotation 15 Degrees   Right/Left Elbow Right   Right Elbow Flexion 90   Right Elbow Extension -15   Right/Left Forearm Right  Right Forearm Pronation 90 Degrees   Right Forearm Supination 10 Degrees   Right/Left Wrist Right   Right Wrist Extension 40 Degrees   Right Wrist Flexion 35 Degrees   Right/Left Finger Right   Right Composite Finger Flexion 25%   PROM   Overall PROM  --   PROM Assessment Site --   Right/Left Shoulder --   Strength   Overall Strength Unable to assess   Strength Assessment Site Hand   Right/Left hand Right   Right Hand Grip (lbs) 5   Right Hand Lateral Pinch --  with index finger only                     [facility] Adult PT Treatment/Exercise - 03/17/15 0001    Exercises   Exercises Wrist   Shoulder Exercises: Supine   External Rotation AROM;Right;15 reps  x 2   Flexion AROM;Strengthening;Right;15 reps  x 4 in sitting and supine   Other  Supine Exercises Velcro board for pro/sup & flex/ext wrist   Other Supine Exercises gripping soft pink ball 2 x 93min   Hand Exercises   Digit Composite ABduction AROM;Seated;Limitations  2 x 51min   Other Hand Exercises spider web fingerflexion 2x 1 min   Manual Therapy   Manual Therapy Soft tissue mobilization;Passive ROM  to Rt shoulder   Edema Management Retrograde milliking to fingers and hand   Soft tissue mobilization to right hand, right elbow   Passive ROM right fingers, right wrist, right elbow, right shoulder flexion, right shoulder IR and ER with elbow at side                  PT Short Term Goals - 03/17/15 1208    PT SHORT TERM GOAL #1   Title independent with initital HEP for shoulder flexion and elbow ROM   Time 4   Period Weeks   Status Achieved   PT SHORT TERM GOAL #2   Title pain in right shoulder and elbow decreased >/= 25%   Time 4   Period Weeks   Status Achieved   PT SHORT TERM GOAL #3   Title right shoulder flexion PROM >/= 115 degrees   Time 4   Period Weeks   Status On-going   PT SHORT TERM GOAL #4   Title right elbow extension >/= -5 degrees   Time 4   Period Weeks   Status On-going   PT SHORT TERM GOAL #5   Title right elbow flexion >/= 110 degrees PROM   Time 4   Period Weeks   Status On-going           PT Long Term Goals - 03/17/15 1209    PT LONG TERM GOAL #1   Title independent with HEP and understand how to progress herself   Time 12   Period Weeks   Status On-going   PT LONG TERM GOAL #2   Title grip strength on right >/= 30 pounds   Time 12   Period Weeks   Status On-going   PT LONG TERM GOAL #3   Time 12   Period Weeks   Status On-going   PT LONG TERM GOAL #4   Title return to driving due to right shoulder strength 4/5   Time 12   Period Weeks   Status On-going   PT LONG TERM GOAL #5   Title able to cook due to right elbow AROM within functional limits   Time 12  Period Weeks   Status On-going                Plan - 03/17/15 1204    Clinical Impression Statement Pt s/p right ORIF condylar humerus fracture 12/23/2014. Right proximal humerus fracture. MD wants gentle ROM right elbow, right forearm and shoulder, no abduction and no lifting more than 5#. Pt will continue to benefit from skilled PT   Rehab Potential Good   Clinical Impairments Affecting Rehab Potential None   PT Frequency 3x / week   PT Duration 12 weeks   PT Treatment/Interventions ADLs/Self Care Home Management;Cryotherapy;Electrical Stimulation;Ultrasound;Moist Heat;Functional mobility training;Therapeutic activities;Therapeutic exercise;Neuromuscular re-education;Manual techniques;Patient/family education;Scar mobilization;Passive range of motion;Taping   PT Next Visit Plan See what MD says, in regard to progression of movements with Rt shoulder - will abduction and strength be allowed?    PT Home Exercise Plan progress as needed   Consulted and Agree with Plan of Care Patient        Problem List Patient Active Problem List   Diagnosis Date Noted  . Right supracondylar humerus fracture; right radial head/neck fracture 12/22/2014    NAUMANN-HOUEGNIFIO,Sherlin Sonier PTA 03/17/2015, 12:17 PM  [facility] [address] [place], [place], [address] Phone: [phone]   Fax:  [phone]

## 2015-03-19 ENCOUNTER — Encounter: Payer: Self-pay | Admitting: Physical Therapy

## 2015-03-19 ENCOUNTER — Ambulatory Visit: Payer: Medicare Other | Admitting: Physical Therapy

## 2015-03-19 DIAGNOSIS — M25621 Stiffness of right elbow, not elsewhere classified: Secondary | ICD-10-CM

## 2015-03-19 DIAGNOSIS — M25521 Pain in right elbow: Secondary | ICD-10-CM

## 2015-03-19 DIAGNOSIS — M25611 Stiffness of right shoulder, not elsewhere classified: Secondary | ICD-10-CM

## 2015-03-19 DIAGNOSIS — R609 Edema, unspecified: Secondary | ICD-10-CM

## 2015-03-19 DIAGNOSIS — M25631 Stiffness of right wrist, not elsewhere classified: Secondary | ICD-10-CM

## 2015-03-19 DIAGNOSIS — M25511 Pain in right shoulder: Secondary | ICD-10-CM

## 2015-03-19 DIAGNOSIS — M25641 Stiffness of right hand, not elsewhere classified: Secondary | ICD-10-CM

## 2015-03-19 NOTE — Therapy (Signed)
[facility] [address], [place], [place], [address] Phone: [phone]   Fax:  [phone]  Physical Therapy Treatment  Patient Details  Name: Katherine Barnett MRN: [id number] Date of Birth: [birth year] Referring Provider:  Pete Pelt, PA-C  Encounter Date: 03/19/2015      PT End of Session - 03/19/15 0924    Visit Number 13   Number of Visits 20   Date for PT Re-Evaluation 05/13/15   PT Start Time 0848   PT Stop Time 0945   PT Time Calculation (min) 57 min   Activity Tolerance Patient tolerated treatment well   Behavior During Therapy [facility] for tasks assessed/performed      Past Medical History  Diagnosis Date  . Hypertension   . Thyroid disease     pt denies this (12/18/14)  . Diabetes mellitus without complication     type 2 - diet controlled  . Complication of anesthesia     vomited after colonoscopy    Past Surgical History  Procedure Laterality Date  . Eye surgery Bilateral     cataract surgery with lens implant  . Colonoscopy    . Orif humerus fracture Right 12/22/2014    Procedure: OPEN REDUCTION INTERNAL FIXATION (ORIF) RIGHT DISTAL HUMERUS FRACTURE;  Surgeon: Mcarthur Rossetti, MD;  [place]: [place];  Service: Orthopedics;  Laterality: Right;    There were no vitals filed for this visit.  Visit Diagnosis:  Shoulder stiffness, right  Elbow stiffness, right  Edema  Stiffness of right hand joint  Wrist stiffness, right  Right shoulder pain  Right elbow pain      Subjective Assessment - 03/19/15 0856    Subjective Saw MD. reports pt is doing well, received written permission on prescription to allow for full motion as tolerated for RT shoulder and elbow. Abduction allowed if not painful.   Currently in Pain? Yes   Pain Score 3    Pain Location Wrist   Pain Orientation Right   Pain Descriptors / Indicators Aching   Aggravating Factors  Over doing    Pain Relieving Factors  rest, heat/ice   Multiple Pain Sites No                         [facility] Adult PT Treatment/Exercise - 03/19/15 0001    Shoulder Exercises: Supine   Flexion AAROM;Both;5 reps  With cane   Shoulder Exercises: Seated   Retraction --  Scap squeezes 10x 3 sec holds   Shoulder Exercises: Standing   Other Standing Exercises Not able to do finger ladder. Fingers limit   Other Standing Exercises Towel push flex/ext 10x  TC/VC to use correct muscles as pt relies on trunk.   Shoulder Exercises: Pulleys   Flexion --  2 x 10 within tolerance   Moist Heat Therapy   Number Minutes Moist Heat 15 Minutes   Moist Heat Location Shoulder   Manual Therapy   Manual Therapy Passive ROM  RT shoulder, wrist, elbow                PT Education - 03/19/15 0923    Education provided Yes   Education Details How to feel her arm moving and not her trunk.   Person(s) Educated Patient   Methods Explanation;Demonstration;Tactile cues;Verbal cues   Comprehension Verbalized understanding;Returned demonstration;Need further instruction          PT Short Term Goals - 03/17/15 1208    PT  SHORT TERM GOAL #1   Title independent with initital HEP for shoulder flexion and elbow ROM   Time 4   Period Weeks   Status Achieved   PT SHORT TERM GOAL #2   Title pain in right shoulder and elbow decreased >/= 25%   Time 4   Period Weeks   Status Achieved   PT SHORT TERM GOAL #3   Title right shoulder flexion PROM >/= 115 degrees   Time 4   Period Weeks   Status On-going   PT SHORT TERM GOAL #4   Title right elbow extension >/= -5 degrees   Time 4   Period Weeks   Status On-going   PT SHORT TERM GOAL #5   Title right elbow flexion >/= 110 degrees PROM   Time 4   Period Weeks   Status On-going           PT Long Term Goals - 03/17/15 1209    PT LONG TERM GOAL #1   Title independent with HEP and understand how to progress herself   Time 12   Period Weeks   Status On-going    PT LONG TERM GOAL #2   Title grip strength on right >/= 30 pounds   Time 12   Period Weeks   Status On-going   PT LONG TERM GOAL #3   Time 12   Period Weeks   Status On-going   PT LONG TERM GOAL #4   Title return to driving due to right shoulder strength 4/5   Time 12   Period Weeks   Status On-going   PT LONG TERM GOAL #5   Title able to cook due to right elbow AROM within functional limits   Time 12   Period Weeks   Status On-going               Plan - 03/19/15 4268    Clinical Impression Statement Pt is now allowed by MD to work towards full ROm of both RT shoulder and elbow. Abduction ok as long as pt tolerates per MD per pt. Pt had trouble utlizing  correct muscles on table exercises. She tends to use his her trunk .    Pt will benefit from skilled therapeutic intervention in order to improve on the following deficits Decreased range of motion;Increased fascial restricitons;Impaired UE functional use;Increased muscle spasms;Decreased endurance;Decreased activity tolerance;Pain;Impaired flexibility;Hypomobility;Decreased scar mobility;Decreased mobility;Decreased strength;Increased edema   Rehab Potential Good   Clinical Impairments Affecting Rehab Potential None   PT Frequency 3x / week   PT Duration 12 weeks   PT Treatment/Interventions ADLs/Self Care Home Management;Cryotherapy;Electrical Stimulation;Ultrasound;Moist Heat;Functional mobility training;Therapeutic activities;Therapeutic exercise;Neuromuscular re-education;Manual techniques;Patient/family education;Scar mobilization;Passive range of motion;Taping   PT Next Visit Plan PROM/AAROM RT shoulder, elbow   Consulted and Agree with Plan of Care Patient        Problem List Patient Active Problem List   Diagnosis Date Noted  . Right supracondylar humerus fracture; right radial head/neck fracture 12/22/2014    Rozina Pointer, PTA 03/19/2015, 9:28 AM  [place] [address], [place], [place], [address] Phone: [phone]   Fax:  [phone]

## 2015-03-24 ENCOUNTER — Ambulatory Visit: Payer: Medicare Other | Admitting: Physical Therapy

## 2015-03-24 ENCOUNTER — Encounter: Payer: Self-pay | Admitting: Physical Therapy

## 2015-03-24 DIAGNOSIS — R609 Edema, unspecified: Secondary | ICD-10-CM

## 2015-03-24 DIAGNOSIS — M25621 Stiffness of right elbow, not elsewhere classified: Secondary | ICD-10-CM

## 2015-03-24 DIAGNOSIS — M25511 Pain in right shoulder: Secondary | ICD-10-CM

## 2015-03-24 DIAGNOSIS — M25611 Stiffness of right shoulder, not elsewhere classified: Secondary | ICD-10-CM | POA: Diagnosis not present

## 2015-03-24 DIAGNOSIS — M25641 Stiffness of right hand, not elsewhere classified: Secondary | ICD-10-CM

## 2015-03-24 DIAGNOSIS — M25631 Stiffness of right wrist, not elsewhere classified: Secondary | ICD-10-CM

## 2015-03-24 NOTE — Therapy (Signed)
[facility] [address] [place], [place], [address] Phone: [phone]   Fax:  [phone]  Physical Therapy Treatment  Patient Details  Name: Katherine Barnett MRN: [id number] Date of Birth: [birth year] Referring Provider:  Pete Pelt, PA-C  Encounter Date: 03/24/2015      PT End of Session - 03/24/15 0933    Visit Number 14   Number of Visits 20   Date for PT Re-Evaluation 05/13/15   PT Start Time 2229   PT Stop Time 0924   PT Time Calculation (min) 37 min   Activity Tolerance Patient tolerated treatment well   Behavior During Therapy [facility] for tasks assessed/performed      Past Medical History  Diagnosis Date  . Hypertension   . Thyroid disease     pt denies this (12/18/14)  . Diabetes mellitus without complication     type 2 - diet controlled  . Complication of anesthesia     vomited after colonoscopy    Past Surgical History  Procedure Laterality Date  . Eye surgery Bilateral     cataract surgery with lens implant  . Colonoscopy    . Orif humerus fracture Right 12/22/2014    Procedure: OPEN REDUCTION INTERNAL FIXATION (ORIF) RIGHT DISTAL HUMERUS FRACTURE;  Surgeon: Mcarthur Rossetti, MD;  Location: [place];  Service: Orthopedics;  Laterality: Right;    There were no vitals filed for this visit.  Visit Diagnosis:  Shoulder stiffness, right  Elbow stiffness, right  Edema  Wrist stiffness, right  Right shoulder pain  Stiffness of right hand joint      Subjective Assessment - 03/24/15 0900    Subjective MD allows full motion as tolerated for Rt shoulder and elbow, abduction allowed if not painful. Pt reports was able to close her bra in the back   Currently in Pain? Yes   Pain Score 4    Pain Location Wrist   Pain Orientation Right   Pain Descriptors / Indicators Tightness   Pain Type Surgical pain   Pain Onset More than a month ago   Pain Frequency Intermittent   Aggravating  Factors  to much activity   Multiple Pain Sites No                         [facility] Adult PT Treatment/Exercise - 03/24/15 0001    Shoulder Exercises: Supine   Flexion AAROM;Both;20 reps;10 reps  with cane   Shoulder Exercises: Seated   Retraction --  scapula squezzes x 10 with 5 sec hold   Shoulder Exercises: Sidelying   External Rotation AROM;Right   Shoulder Exercises: Standing   Other Standing Exercises walladder up to peg #5 x3   Shoulder Exercises: Pulleys   Flexion 3 minutes  sitting in chair with blue pillow and feet on 6" step   Flexion Limitations 68minutes in standing facing wall for flexion   Moist Heat Therapy   Number Minutes Moist Heat 7 Minutes   Moist Heat Location Shoulder  in supine, pt in supine and not more able to tolerate weight   Manual Therapy   Manual Therapy Passive ROM  Rt shoulder, elbow and wrist in sidelying and supine   Edema Management Retrograde milliking to fingers and hand   Soft tissue mobilization to right hand, right elbow   Passive ROM right fingers, right wrist, right elbow, right shoulder flexion, right shoulder IR and ER with elbow at side  PT Short Term Goals - 03/17/15 1208    PT SHORT TERM GOAL #1   Title independent with initital HEP for shoulder flexion and elbow ROM   Time 4   Period Weeks   Status Achieved   PT SHORT TERM GOAL #2   Title pain in right shoulder and elbow decreased >/= 25%   Time 4   Period Weeks   Status Achieved   PT SHORT TERM GOAL #3   Title right shoulder flexion PROM >/= 115 degrees   Time 4   Period Weeks   Status On-going   PT SHORT TERM GOAL #4   Title right elbow extension >/= -5 degrees   Time 4   Period Weeks   Status On-going   PT SHORT TERM GOAL #5   Title right elbow flexion >/= 110 degrees PROM   Time 4   Period Weeks   Status On-going           PT Long Term Goals - 03/17/15 1209    PT LONG TERM GOAL #1   Title independent with HEP  and understand how to progress herself   Time 12   Period Weeks   Status On-going   PT LONG TERM GOAL #2   Title grip strength on right >/= 30 pounds   Time 12   Period Weeks   Status On-going   PT LONG TERM GOAL #3   Time 12   Period Weeks   Status On-going   PT LONG TERM GOAL #4   Title return to driving due to right shoulder strength 4/5   Time 12   Period Weeks   Status On-going   PT LONG TERM GOAL #5   Title able to cook due to right elbow AROM within functional limits   Time 12   Period Weeks   Status On-going               Plan - 03/24/15 8937    Clinical Impression Statement Pt is allowed by MD for full ROM and elbow to patients tolerance, esspecially abduction. Pt with decreased control of muscles to control eccentric movement.   Pt will benefit from skilled therapeutic intervention in order to improve on the following deficits Decreased range of motion;Increased fascial restricitons;Impaired UE functional use;Increased muscle spasms;Decreased endurance;Decreased activity tolerance;Pain;Impaired flexibility;Hypomobility;Decreased scar mobility;Decreased mobility;Decreased strength;Increased edema   Rehab Potential Good   PT Frequency 3x / week   PT Duration 12 weeks   PT Treatment/Interventions ADLs/Self Care Home Management;Cryotherapy;Electrical Stimulation;Ultrasound;Moist Heat;Functional mobility training;Therapeutic activities;Therapeutic exercise;Neuromuscular re-education;Manual techniques;Patient/family education;Scar mobilization;Passive range of motion;Taping   PT Next Visit Plan Continue with pullies for Rt shoulder ROM, walllader, sTW and finemorotic rt fingers   PT Home Exercise Plan progress as needed   Consulted and Agree with Plan of Care Patient        Problem List Patient Active Problem List   Diagnosis Date Noted  . Right supracondylar humerus fracture; right radial head/neck fracture 12/22/2014    NAUMANN-HOUEGNIFIO,Lyan Holck  PTA 03/24/2015, 9:43 AM   [facility] [address], [place], [place], [address] Phone: [phone]   Fax:  [phone]

## 2015-03-26 ENCOUNTER — Encounter: Payer: Self-pay | Admitting: Physical Therapy

## 2015-03-26 ENCOUNTER — Ambulatory Visit: Payer: Medicare Other | Attending: Physician Assistant | Admitting: Physical Therapy

## 2015-03-26 DIAGNOSIS — R609 Edema, unspecified: Secondary | ICD-10-CM | POA: Insufficient documentation

## 2015-03-26 DIAGNOSIS — M25641 Stiffness of right hand, not elsewhere classified: Secondary | ICD-10-CM | POA: Diagnosis present

## 2015-03-26 DIAGNOSIS — M25621 Stiffness of right elbow, not elsewhere classified: Secondary | ICD-10-CM | POA: Insufficient documentation

## 2015-03-26 DIAGNOSIS — M25631 Stiffness of right wrist, not elsewhere classified: Secondary | ICD-10-CM | POA: Diagnosis present

## 2015-03-26 DIAGNOSIS — M25611 Stiffness of right shoulder, not elsewhere classified: Secondary | ICD-10-CM | POA: Insufficient documentation

## 2015-03-26 DIAGNOSIS — M25511 Pain in right shoulder: Secondary | ICD-10-CM | POA: Insufficient documentation

## 2015-03-26 DIAGNOSIS — M25521 Pain in right elbow: Secondary | ICD-10-CM | POA: Insufficient documentation

## 2015-03-26 NOTE — Therapy (Signed)
[facility] [address] [place], [place], [address] Phone: [phone]   Fax:  [phone]  Physical Therapy Treatment  Patient Details  Name: Katherine Barnett MRN: [id number] Date of Birth: 06/14/1941 Referring Provider:  Pete Pelt, PA-C  Encounter Date: 03/26/2015      PT End of Session - 03/26/15 0920    Visit Number 15   Number of Visits 20   Date for PT Re-Evaluation 05/13/15   PT Start Time 0845   PT Stop Time 0935   PT Time Calculation (min) 50 min   Activity Tolerance Patient tolerated treatment well   Behavior During Therapy [facility] for tasks assessed/performed      Past Medical History  Diagnosis Date  . Hypertension   . Thyroid disease     pt denies this (12/18/14)  . Diabetes mellitus without complication     type 2 - diet controlled  . Complication of anesthesia     vomited after colonoscopy    Past Surgical History  Procedure Laterality Date  . Eye surgery Bilateral     cataract surgery with lens implant  . Colonoscopy    . Orif humerus fracture Right 12/22/2014    Procedure: OPEN REDUCTION INTERNAL FIXATION (ORIF) RIGHT DISTAL HUMERUS FRACTURE;  Surgeon: Mcarthur Rossetti, MD;  Location: [place];  Service: Orthopedics;  Laterality: Right;    There were no vitals filed for this visit.  Visit Diagnosis:  Shoulder stiffness, right  Elbow stiffness, right  Right shoulder pain      Subjective Assessment - 03/26/15 0848    Subjective No complaints today.  I think I'm doing great.   Currently in Pain? No/denies   Multiple Pain Sites No                         [facility] Adult PT Treatment/Exercise - 03/26/15 0001    Shoulder Exercises: Supine   External Rotation AAROM;Right;20 reps   Flexion AAROM;Both;20 reps  with cane, put towel under RT scapular to give cue   Shoulder Exercises: Standing   Extension Strengthening;Right;20 reps;Theraband   Theraband Level  (Shoulder Extension) Level 1 (Yellow)   Other Standing Exercises wall ladder 3x3 reached #5  TC to keep pt from leaning too far LT   Shoulder Exercises: Pulleys   Flexion --  3 x10  standing   ABduction --  10 x with PTA assisting arm into scaption   Moist Heat Therapy   Number Minutes Moist Heat 10 Minutes   Moist Heat Location Shoulder  in supine, pt in supine and not more able to tolerate weight                PT Education - 03/26/15 0914    Education provided Yes   Education Details Yellow tband standing extension   Person(s) Educated Patient   Methods Explanation;Demonstration;Tactile cues;Verbal cues;Handout  yellow band   Comprehension Returned demonstration;Verbalized understanding          PT Short Term Goals - 03/17/15 1208    PT SHORT TERM GOAL #1   Title independent with initital HEP for shoulder flexion and elbow ROM   Time 4   Period Weeks   Status Achieved   PT SHORT TERM GOAL #2   Title pain in right shoulder and elbow decreased >/= 25%   Time 4   Period Weeks   Status Achieved   PT SHORT TERM GOAL #3   Title  right shoulder flexion PROM >/= 115 degrees   Time 4   Period Weeks   Status On-going   PT SHORT TERM GOAL #4   Title right elbow extension >/= -5 degrees   Time 4   Period Weeks   Status On-going   PT SHORT TERM GOAL #5   Title right elbow flexion >/= 110 degrees PROM   Time 4   Period Weeks   Status On-going           PT Long Term Goals - 03/17/15 1209    PT LONG TERM GOAL #1   Title independent with HEP and understand how to progress herself   Time 12   Period Weeks   Status On-going   PT LONG TERM GOAL #2   Title grip strength on right >/= 30 pounds   Time 12   Period Weeks   Status On-going   PT LONG TERM GOAL #3   Time 12   Period Weeks   Status On-going   PT LONG TERM GOAL #4   Title return to driving due to right shoulder strength 4/5   Time 12   Period Weeks   Status On-going   PT LONG TERM GOAL #5    Title able to cook due to right elbow AROM within functional limits   Time 12   Period Weeks   Status On-going               Plan - 03/26/15 0923    Clinical Impression Statement Continues to have difficulty lifting arm against gravity without compensation from cervical muscles, but supine cane work much improved in ease and distance.   Pt will benefit from skilled therapeutic intervention in order to improve on the following deficits Decreased range of motion;Increased fascial restricitons;Impaired UE functional use;Increased muscle spasms;Decreased endurance;Decreased activity tolerance;Pain;Impaired flexibility;Hypomobility;Decreased scar mobility;Decreased mobility;Decreased strength;Increased edema   Rehab Potential Good   Clinical Impairments Affecting Rehab Potential None   PT Frequency 3x / week   PT Duration 12 weeks   PT Treatment/Interventions ADLs/Self Care Home Management;Cryotherapy;Electrical Stimulation;Ultrasound;Moist Heat;Functional mobility training;Therapeutic activities;Therapeutic exercise;Neuromuscular re-education;Manual techniques;Patient/family education;Scar mobilization;Passive range of motion;Taping   PT Next Visit Plan Continue with pullies for Rt shoulder ROM, walllader, sTW and finemorotic rt fingers   Consulted and Agree with Plan of Care Patient        Problem List Patient Active Problem List   Diagnosis Date Noted  . Right supracondylar humerus fracture; right radial head/neck fracture 12/22/2014    Taesean Reth, PTA 03/26/2015, 9:26 AM  [place] [facility] [address] [place], [place], [address] Phone: [phone]   Fax:  [phone]

## 2015-03-26 NOTE — Patient Instructions (Signed)
EXTENSION: Standing - Resistance Band: Unstable (Active)   Ignore what person is standing on:) Just have good posture., right arm at side. Against yellow resistance band, draw arm backward, as far as possible, keeping elbow straight. Complete _3__ sets of _10__ repetitions. Perform __2_ sessions per day.  Copyright  VHI. All rights reserved.

## 2015-04-06 ENCOUNTER — Encounter: Payer: Self-pay | Admitting: Physical Therapy

## 2015-04-06 ENCOUNTER — Ambulatory Visit: Payer: Medicare Other | Admitting: Physical Therapy

## 2015-04-06 DIAGNOSIS — M25611 Stiffness of right shoulder, not elsewhere classified: Secondary | ICD-10-CM

## 2015-04-06 DIAGNOSIS — M25511 Pain in right shoulder: Secondary | ICD-10-CM

## 2015-04-06 DIAGNOSIS — M25631 Stiffness of right wrist, not elsewhere classified: Secondary | ICD-10-CM

## 2015-04-06 DIAGNOSIS — R609 Edema, unspecified: Secondary | ICD-10-CM

## 2015-04-06 DIAGNOSIS — M25621 Stiffness of right elbow, not elsewhere classified: Secondary | ICD-10-CM

## 2015-04-06 DIAGNOSIS — M25641 Stiffness of right hand, not elsewhere classified: Secondary | ICD-10-CM

## 2015-04-06 NOTE — Therapy (Signed)
[facility] [address] [place], [place], [address] Phone: [phone]   Fax:  [phone]  Physical Therapy Treatment  Patient Details  Name: Katherine Barnett MRN: [id number] Date of Birth: [birth year] Referring Provider:  Carney Bern, PA  Encounter Date: 04/06/2015      PT End of Session - 04/06/15 0910    Visit Number 16   Number of Visits 20   Date for PT Re-Evaluation 05/13/15   PT Start Time 0845   PT Stop Time 0946   PT Time Calculation (min) 61 min   Activity Tolerance Patient tolerated treatment well   Behavior During Therapy [facility] for tasks assessed/performed      Past Medical History  Diagnosis Date  . Hypertension   . Thyroid disease     pt denies this (12/18/14)  . Diabetes mellitus without complication     type 2 - diet controlled  . Complication of anesthesia     vomited after colonoscopy    Past Surgical History  Procedure Laterality Date  . Eye surgery Bilateral     cataract surgery with lens implant  . Colonoscopy    . Orif humerus fracture Right 12/22/2014    Procedure: OPEN REDUCTION INTERNAL FIXATION (ORIF) RIGHT DISTAL HUMERUS FRACTURE;  Surgeon: Mcarthur Rossetti, MD;  Location: [place];  Service: Orthopedics;  Laterality: Right;    There were no vitals filed for this visit.  Visit Diagnosis:  Shoulder stiffness, right  Elbow stiffness, right  Right shoulder pain  Edema  Wrist stiffness, right  Stiffness of right hand joint      Subjective Assessment - 04/06/15 0858    Subjective No complain of pain,  but soreness in Rt shoulder and very limited with rango of motion unable to turn key   Currently in Pain? No/denies                         [facility] Adult PT Treatment/Exercise - 04/06/15 0001    Exercises   Exercises Wrist   Shoulder Exercises: Supine   Other Supine Exercises Velcro board for pro/sup & flex/ext wrist   Shoulder Exercises: Seated   Retraction --  scapula squezzes x 10 with 5 sec hold   Shoulder Exercises: Standing   External Rotation AROM;20 reps;Theraband   Theraband Level (Shoulder External Rotation) Level 1 (Yellow)  to stimulate muscle activation, isometric   Row 20 reps;10 reps;Strengthening;Theraband   Theraband Level (Shoulder Row) Level 1 (Yellow)   Other Standing Exercises wall ladder 3x3 reached #4  pt was tearful today, due to difficult of task   Shoulder Exercises: Pulleys   Flexion 3 minutes   ABduction 3 minutes   Cryotherapy   Number Minutes Cryotherapy 10 Minutes   Cryotherapy Location Shoulder   Type of Cryotherapy Ice pack   Manual Therapy   Manual Therapy Passive ROM   Edema Management Retrograde milliking to fingers and hand   Soft tissue mobilization to right hand, right elbow   Passive ROM right fingers, right wrist, right elbow, right shoulder flexion, right shoulder IR and ER with elbow at side                  PT Short Term Goals - 04/06/15 0920    PT SHORT TERM GOAL #1   Title independent with initital HEP for shoulder flexion and elbow ROM   Time 4   Period Weeks   Status Achieved   PT  SHORT TERM GOAL #2   Title pain in right shoulder and elbow decreased >/= 25%   Time 4   Period Weeks   Status Achieved   PT SHORT TERM GOAL #3   Title right shoulder flexion PROM >/= 115 degrees   Time 4   Period Weeks   Status On-going   PT SHORT TERM GOAL #4   Title right elbow extension >/= -5 degrees   Time 4   Period Weeks   Status On-going   PT SHORT TERM GOAL #5   Title right elbow flexion >/= 110 degrees PROM   Time 4   Period Weeks   Status On-going           PT Long Term Goals - 03/17/15 1209    PT LONG TERM GOAL #1   Title independent with HEP and understand how to progress herself   Time 12   Period Weeks   Status On-going   PT LONG TERM GOAL #2   Title grip strength on right >/= 30 pounds   Time 12   Period Weeks   Status On-going   PT LONG TERM  GOAL #3   Time 12   Period Weeks   Status On-going   PT LONG TERM GOAL #4   Title return to driving due to right shoulder strength 4/5   Time 12   Period Weeks   Status On-going   PT LONG TERM GOAL #5   Title able to cook due to right elbow AROM within functional limits   Time 12   Period Weeks   Status On-going               Plan - 04/06/15 0911    Clinical Impression Statement Pt with limited ROM and finemotoric Rt hand, AROM in degrees Rt shoulder flexion 80,  Rt elbow flexion 90. supination 0, pronation 75. Pt will continue to benefit from skilled PT to improve ROM shoulder elbow and wrist.   Pt will benefit from skilled therapeutic intervention in order to improve on the following deficits Decreased range of motion;Increased fascial restricitons;Impaired UE functional use;Increased muscle spasms;Decreased endurance;Decreased activity tolerance;Pain;Impaired flexibility;Hypomobility;Decreased scar mobility;Decreased mobility;Decreased strength;Increased edema   Rehab Potential Good   PT Frequency 3x / week   PT Duration 12 weeks   PT Next Visit Plan Continue with pullies for Rt shoulder ROM, walllader, sTW and finemorotic rt fingers   PT Home Exercise Plan progress as needed   Consulted and Agree with Plan of Care Patient        Problem List Patient Active Problem List   Diagnosis Date Noted  . Right supracondylar humerus fracture; right radial head/neck fracture 12/22/2014    NAUMANN-HOUEGNIFIO,Corinthian Kemler PTA 04/06/2015, 12:17 PM  [place] [facility] [address] [place], [place], [address] Phone: [phone]   Fax:  [phone]

## 2015-04-08 ENCOUNTER — Ambulatory Visit: Payer: Medicare Other | Admitting: Physical Therapy

## 2015-04-08 ENCOUNTER — Encounter: Payer: Self-pay | Admitting: Physical Therapy

## 2015-04-08 DIAGNOSIS — M25611 Stiffness of right shoulder, not elsewhere classified: Secondary | ICD-10-CM

## 2015-04-08 DIAGNOSIS — M25641 Stiffness of right hand, not elsewhere classified: Secondary | ICD-10-CM

## 2015-04-08 DIAGNOSIS — M25621 Stiffness of right elbow, not elsewhere classified: Secondary | ICD-10-CM

## 2015-04-08 DIAGNOSIS — M25631 Stiffness of right wrist, not elsewhere classified: Secondary | ICD-10-CM

## 2015-04-08 DIAGNOSIS — M25511 Pain in right shoulder: Secondary | ICD-10-CM

## 2015-04-08 DIAGNOSIS — R609 Edema, unspecified: Secondary | ICD-10-CM

## 2015-04-08 NOTE — Therapy (Signed)
[facility] [address] [place], [place], [address] Phone: [phone]   Fax:  [phone]  Physical Therapy Treatment  Patient Details  Name: Katherine Barnett MRN: [id number] Date of Birth: 1941/03/01 Referring Provider:  Carney Bern, PA  Encounter Date: 04/08/2015      PT End of Session - 04/08/15 0905    Visit Number 17   Number of Visits 20   Date for PT Re-Evaluation 05/13/15   PT Start Time 0845   PT Stop Time 0943   PT Time Calculation (min) 58 min   Activity Tolerance Patient tolerated treatment well   Behavior During Therapy [facility] for tasks assessed/performed      Past Medical History  Diagnosis Date  . Hypertension   . Thyroid disease     pt denies this (12/18/14)  . Diabetes mellitus without complication     type 2 - diet controlled  . Complication of anesthesia     vomited after colonoscopy    Past Surgical History  Procedure Laterality Date  . Eye surgery Bilateral     cataract surgery with lens implant  . Colonoscopy    . Orif humerus fracture Right 12/22/2014    Procedure: OPEN REDUCTION INTERNAL FIXATION (ORIF) RIGHT DISTAL HUMERUS FRACTURE;  Surgeon: Mcarthur Rossetti, MD;  Location: [place];  Service: Orthopedics;  Laterality: Right;    There were no vitals filed for this visit.  Visit Diagnosis:  Shoulder stiffness, right  Elbow stiffness, right  Right shoulder pain  Edema  Wrist stiffness, right  Stiffness of right hand joint      Subjective Assessment - 04/08/15 0851    Subjective No complains of pain, but soreness in Rt shoulder, elbow and hand. Concerned about not able to turn a key   Currently in Pain? No/denies  sorness   Multiple Pain Sites No                         [facility] Adult PT Treatment/Exercise - 04/08/15 0001    Exercises   Exercises Wrist   Shoulder Exercises: Supine   Flexion AAROM;Both;20 reps  using cane   Other Supine  Exercises Velcro board for pro/sup & flex/ext wrist   Shoulder Exercises: Seated   Retraction Strengthening;20 reps  scapula squezzes with 5 sec hold   Shoulder Exercises: Standing   Flexion AAROM  wallwash with towel in available ROM x 1 min   Other Standing Exercises wall ladder 4x3 reached #4  pt with more confidence today   Shoulder Exercises: Pulleys   Flexion 3 minutes   ABduction 3 minutes   Cryotherapy   Number Minutes Cryotherapy 10 Minutes   Cryotherapy Location Shoulder   Type of Cryotherapy Ice pack   Manual Therapy   Manual Therapy Passive ROM  Rt elbow extension/flexion   Edema Management Retrograde milliking to fingers and hand   Soft tissue mobilization to right hand, right elbow   Passive ROM right fingers, right wrist, right elbow, right shoulder flexion, right shoulder IR and ER with elbow at side                  PT Short Term Goals - 04/06/15 0920    PT SHORT TERM GOAL #1   Title independent with initital HEP for shoulder flexion and elbow ROM   Time 4   Period Weeks   Status Achieved   PT SHORT TERM GOAL #2   Title pain  in right shoulder and elbow decreased >/= 25%   Time 4   Period Weeks   Status Achieved   PT SHORT TERM GOAL #3   Title right shoulder flexion PROM >/= 115 degrees   Time 4   Period Weeks   Status On-going   PT SHORT TERM GOAL #4   Title right elbow extension >/= -5 degrees   Time 4   Period Weeks   Status On-going   PT SHORT TERM GOAL #5   Title right elbow flexion >/= 110 degrees PROM   Time 4   Period Weeks   Status On-going           PT Long Term Goals - 03/17/15 1209    PT LONG TERM GOAL #1   Title independent with HEP and understand how to progress herself   Time 12   Period Weeks   Status On-going   PT LONG TERM GOAL #2   Title grip strength on right >/= 30 pounds   Time 12   Period Weeks   Status On-going   PT LONG TERM GOAL #3   Time 12   Period Weeks   Status On-going   PT LONG TERM GOAL  #4   Title return to driving due to right shoulder strength 4/5   Time 12   Period Weeks   Status On-going   PT LONG TERM GOAL #5   Title able to cook due to right elbow AROM within functional limits   Time 12   Period Weeks   Status On-going               Plan - 04/08/15 0907    Clinical Impression Statement Pt with limited ROM and finemorotic Rt hand, AROM in degrees flexio 80, Rt elbow flexion 90, elbow extension -15, supination 0, pronation 75. Pt wil continue to benefit from silled PT to improve ROM in Rt UE.   Rehab Potential Good   PT Frequency 3x / week   PT Duration 12 weeks   PT Next Visit Plan Continue with pullies for Rt shoulder ROM, walllader, sTW and finemorotic rt fingers   PT Home Exercise Plan progress as needed   Consulted and Agree with Plan of Care Patient        Problem List Patient Active Problem List   Diagnosis Date Noted  . Right supracondylar humerus fracture; right radial head/neck fracture 12/22/2014    NAUMANN-HOUEGNIFIO,Marquelle Balow PTA 04/08/2015, 9:28 AM  [place] [facility] [address] [place], [place], [address] Phone: [phone]   Fax:  [phone]

## 2015-04-09 ENCOUNTER — Ambulatory Visit: Payer: Medicare Other | Admitting: Physical Therapy

## 2015-04-09 ENCOUNTER — Encounter: Payer: Self-pay | Admitting: Physical Therapy

## 2015-04-09 DIAGNOSIS — M25511 Pain in right shoulder: Secondary | ICD-10-CM

## 2015-04-09 DIAGNOSIS — M25641 Stiffness of right hand, not elsewhere classified: Secondary | ICD-10-CM

## 2015-04-09 DIAGNOSIS — M25631 Stiffness of right wrist, not elsewhere classified: Secondary | ICD-10-CM

## 2015-04-09 DIAGNOSIS — M25611 Stiffness of right shoulder, not elsewhere classified: Secondary | ICD-10-CM

## 2015-04-09 DIAGNOSIS — R609 Edema, unspecified: Secondary | ICD-10-CM

## 2015-04-09 DIAGNOSIS — M25621 Stiffness of right elbow, not elsewhere classified: Secondary | ICD-10-CM

## 2015-04-09 NOTE — Therapy (Signed)
[facility] [address], [place], [place], [address] Phone: [phone]   Fax:  [phone]  Physical Therapy Treatment  Patient Details  Name: Katherine Barnett MRN: [id number] Date of Birth: 1941/06/14 Referring Provider:  Carney Bern, PA  Encounter Date: 04/09/2015      PT End of Session - 04/08/15 0905    Visit Number 17   Number of Visits 20   Date for PT Re-Evaluation 05/13/15   PT Start Time 0845   PT Stop Time 0943   PT Time Calculation (min) 58 min   Activity Tolerance Patient tolerated treatment well   Behavior During Therapy [facility] for tasks assessed/performed      Past Medical History  Diagnosis Date  . Hypertension   . Thyroid disease     pt denies this (12/18/14)  . Diabetes mellitus without complication     type 2 - diet controlled  . Complication of anesthesia     vomited after colonoscopy    Past Surgical History  Procedure Laterality Date  . Eye surgery Bilateral     cataract surgery with lens implant  . Colonoscopy    . Orif humerus fracture Right 12/22/2014    Procedure: OPEN REDUCTION INTERNAL FIXATION (ORIF) RIGHT DISTAL HUMERUS FRACTURE;  Surgeon: Mcarthur Rossetti, MD;  Location: [place];  Service: Orthopedics;  Laterality: Right;    There were no vitals filed for this visit.  Visit Diagnosis:  Shoulder stiffness, right  Elbow stiffness, right  Right shoulder pain  Edema  Wrist stiffness, right  Stiffness of right hand joint      Subjective Assessment - 04/09/15 0853    Subjective Shoulder does not feel as tight as yesterday, no complain of pain premedicated. Turning forearm outside is very limited and painful   Currently in Pain? No/denies  sorness                         [facility] Adult PT Treatment/Exercise - 04/09/15 0001    Exercises   Exercises Wrist   Elbow Exercises   Other elbow exercises DF x 20 in standing,  Plantarflexion to causes pain  in elbow and shoulder rated as 8-9/10   Shoulder Exercises: Supine   External Rotation AAROM;Right;20 reps   Flexion AAROM;Both;20 reps  using cane   Other Supine Exercises Velcro board for pro/sup & flex/ext wrist x 4 each   flex/ext in standing   Shoulder Exercises: Standing   Other Standing Exercises wall ladder 5x2 reached #4   Shoulder Exercises: Pulleys   Flexion 3 minutes   ABduction 3 minutes  in standing   Cryotherapy   Number Minutes Cryotherapy 10 Minutes   Cryotherapy Location Shoulder   Type of Cryotherapy Ice pack   Manual Therapy   Manual Therapy Passive ROM  Rt elbow and wrist flexion and extension   Edema Management Retrograde milliking to fingers and hand   Soft tissue mobilization to right hand, right elbow   Passive ROM right fingers, right wrist, right elbow, right shoulder flexion, right shoulder IR and ER with elbow at side                  PT Short Term Goals - 04/06/15 0920    PT SHORT TERM GOAL #1   Title independent with initital HEP for shoulder flexion and elbow ROM   Time 4   Period Weeks   Status Achieved   PT SHORT TERM GOAL #  2   Title pain in right shoulder and elbow decreased >/= 25%   Time 4   Period Weeks   Status Achieved   PT SHORT TERM GOAL #3   Title right shoulder flexion PROM >/= 115 degrees   Time 4   Period Weeks   Status On-going   PT SHORT TERM GOAL #4   Title right elbow extension >/= -5 degrees   Time 4   Period Weeks   Status On-going   PT SHORT TERM GOAL #5   Title right elbow flexion >/= 110 degrees PROM   Time 4   Period Weeks   Status On-going           PT Long Term Goals - 03/17/15 1209    PT LONG TERM GOAL #1   Title independent with HEP and understand how to progress herself   Time 12   Period Weeks   Status On-going   PT LONG TERM GOAL #2   Title grip strength on right >/= 30 pounds   Time 12   Period Weeks   Status On-going   PT LONG TERM GOAL #3   Time 12   Period Weeks    Status On-going   PT LONG TERM GOAL #4   Title return to driving due to right shoulder strength 4/5   Time 12   Period Weeks   Status On-going   PT LONG TERM GOAL #5   Title able to cook due to right elbow AROM within functional limits   Time 12   Period Weeks   Status On-going               Plan - 04/08/15 0907    Clinical Impression Statement Pt with limited ROM and finemorotic Rt hand, AROM in degrees flexio 80, Rt elbow flexion 90, elbow extension -15, supination 0, pronation 75. Pt wil continue to benefit from silled PT to improve ROM in Rt UE.   Rehab Potential Good   PT Frequency 3x / week   PT Duration 12 weeks   PT Next Visit Plan Continue with pullies for Rt shoulder ROM, walllader, sTW and finemorotic rt fingers   PT Home Exercise Plan progress as needed   Consulted and Agree with Plan of Care Patient        Problem List Patient Active Problem List   Diagnosis Date Noted  . Right supracondylar humerus fracture; right radial head/neck fracture 12/22/2014    NAUMANN-HOUEGNIFIO,ELKE PTA 04/09/2015, 9:22 AM  [facility] [address], [place], [place], [address] Phone: [phone]   Fax:  [phone]

## 2015-04-12 ENCOUNTER — Ambulatory Visit: Payer: Medicare Other | Admitting: Physical Therapy

## 2015-04-12 ENCOUNTER — Encounter: Payer: Self-pay | Admitting: Physical Therapy

## 2015-04-12 DIAGNOSIS — M25631 Stiffness of right wrist, not elsewhere classified: Secondary | ICD-10-CM

## 2015-04-12 DIAGNOSIS — R609 Edema, unspecified: Secondary | ICD-10-CM

## 2015-04-12 DIAGNOSIS — M25521 Pain in right elbow: Secondary | ICD-10-CM

## 2015-04-12 DIAGNOSIS — M25511 Pain in right shoulder: Secondary | ICD-10-CM

## 2015-04-12 DIAGNOSIS — M25611 Stiffness of right shoulder, not elsewhere classified: Secondary | ICD-10-CM

## 2015-04-12 DIAGNOSIS — M25621 Stiffness of right elbow, not elsewhere classified: Secondary | ICD-10-CM

## 2015-04-12 DIAGNOSIS — M25641 Stiffness of right hand, not elsewhere classified: Secondary | ICD-10-CM

## 2015-04-12 NOTE — Therapy (Signed)
[facility] [address] [place], [place], [address] Phone: [phone]   Fax:  [phone]  Physical Therapy Treatment  Patient Details  Name: Katherine Barnett MRN: [id number] Date of Birth: [birth year] Referring Provider:  [name], MD  Encounter Date: 04/12/2015      PT End of Session - 04/12/15 0859    Visit Number 18   Number of Visits 20   Date for PT Re-Evaluation 05/13/15   PT Start Time 0844   PT Stop Time 0942   PT Time Calculation (min) 58 min   Activity Tolerance Patient tolerated treatment well   Behavior During Therapy [facility] for tasks assessed/performed      Past Medical History  Diagnosis Date  . Hypertension   . Thyroid disease     pt denies this (12/18/14)  . Diabetes mellitus without complication     type 2 - diet controlled  . Complication of anesthesia     vomited after colonoscopy    Past Surgical History  Procedure Laterality Date  . Eye surgery Bilateral     cataract surgery with lens implant  . Colonoscopy    . Orif humerus fracture Right 12/22/2014    Procedure: OPEN REDUCTION INTERNAL FIXATION (ORIF) RIGHT DISTAL HUMERUS FRACTURE;  Surgeon: Mcarthur Rossetti, MD;  Location: [place];  Service: Orthopedics;  Laterality: Right;    There were no vitals filed for this visit.  Visit Diagnosis:  Shoulder stiffness, right  Elbow stiffness, right  Right shoulder pain  Edema  Wrist stiffness, right  Stiffness of right hand joint  Right elbow pain      Subjective Assessment - 04/12/15 0849    Subjective No complain in resting position, if patient is up and around upper shoulder and chest starts to hurt rated as 8/10, if patient is able to rest in supine the pain resolves immediately.   Currently in Pain? No/denies                         [facility] Adult PT Treatment/Exercise - 04/12/15 0001    Exercises   Exercises Wrist   Elbow Exercises   Other  elbow exercises DF2 x 20 in standing,  Palmarflexion to causes pain in elbow and shoulder rated as 8-9/10   Shoulder Exercises: Supine   External Rotation AAROM;Right;20 reps   Flexion AAROM;Both;20 reps  using cane   Other Supine Exercises Velcro board for pro/sup & flex/ext wrist x 4 each   flex/ext   Shoulder Exercises: Standing   Other Standing Exercises wall ladder 3x5 reached #5   Shoulder Exercises: Pulleys   Flexion 3 minutes   ABduction 3 minutes   Cryotherapy   Number Minutes Cryotherapy 10 Minutes   Cryotherapy Location Shoulder   Type of Cryotherapy Ice pack   Manual Therapy   Manual Therapy Passive ROM  Rt elbow and wrist in all planes   Edema Management Retrograde milliking to fingers and hand   Soft tissue mobilization to right hand, right elbow   Passive ROM right fingers, right wrist, right elbow, right shoulder flexion, right shoulder IR and ER with elbow at side                  PT Short Term Goals - 04/12/15 0906    PT SHORT TERM GOAL #1   Title independent with initital HEP for shoulder flexion and elbow ROM   Time 4   Period Weeks  Status Achieved   PT SHORT TERM GOAL #2   Title pain in right shoulder and elbow decreased >/= 25%   Time 4   Period Weeks   Status Achieved   PT SHORT TERM GOAL #3   Title right shoulder flexion PROM >/= 115 degrees   Time 4   Period Weeks   Status On-going   PT SHORT TERM GOAL #4   Title right elbow extension >/= -5 degrees   Time 4   Period Weeks   Status On-going   PT SHORT TERM GOAL #5   Title right elbow flexion >/= 110 degrees PROM   Time 4   Period Weeks   Status On-going           PT Long Term Goals - 03/17/15 1209    PT LONG TERM GOAL #1   Title independent with HEP and understand how to progress herself   Time 12   Period Weeks   Status On-going   PT LONG TERM GOAL #2   Title grip strength on right >/= 30 pounds   Time 12   Period Weeks   Status On-going   PT LONG TERM GOAL #3    Time 12   Period Weeks   Status On-going   PT LONG TERM GOAL #4   Title return to driving due to right shoulder strength 4/5   Time 12   Period Weeks   Status On-going   PT LONG TERM GOAL #5   Title able to cook due to right elbow AROM within functional limits   Time 12   Period Weeks   Status On-going               Plan - 04/12/15 0901    Clinical Impression Statement Pt with limited ROM and finemotoric Rt hand, Rt  AROM in degrees wrist: dorsal flexion 80, palmar flexion 25, elbow: extension -12 in neutral position,  flexion 85 , supination 5, pronation  80. Pt will continue to benefit from skilled PT to improve ROM in Rt UE.     Pt will benefit from skilled therapeutic intervention in order to improve on the following deficits Decreased range of motion;Increased fascial restricitons;Impaired UE functional use;Increased muscle spasms;Decreased endurance;Decreased activity tolerance;Pain;Impaired flexibility;Hypomobility;Decreased scar mobility;Decreased mobility;Decreased strength;Increased edema   Rehab Potential Good   PT Frequency 3x / week   PT Duration 12 weeks   PT Treatment/Interventions ADLs/Self Care Home Management;Cryotherapy;Electrical Stimulation;Ultrasound;Moist Heat;Functional mobility training;Therapeutic activities;Therapeutic exercise;Neuromuscular re-education;Manual techniques;Patient/family education;Scar mobilization;Passive range of motion;Taping   PT Next Visit Plan Continue with pullies for Rt shoulder ROM, walllader, sTW and finemorotic rt fingers   PT Home Exercise Plan progress as needed   Consulted and Agree with Plan of Care Patient        Problem List Patient Active Problem List   Diagnosis Date Noted  . Right supracondylar humerus fracture; right radial head/neck fracture 12/22/2014    NAUMANN-HOUEGNIFIO,ELKE PTA 04/12/2015, 9:32 AM  [facility] [address] [place], [place], [address] Phone: [phone]   Fax:  [phone]

## 2015-04-13 ENCOUNTER — Ambulatory Visit: Payer: Medicare Other | Admitting: Physical Therapy

## 2015-04-13 ENCOUNTER — Encounter: Payer: Self-pay | Admitting: Physical Therapy

## 2015-04-13 DIAGNOSIS — M25621 Stiffness of right elbow, not elsewhere classified: Secondary | ICD-10-CM

## 2015-04-13 DIAGNOSIS — R609 Edema, unspecified: Secondary | ICD-10-CM

## 2015-04-13 DIAGNOSIS — M25641 Stiffness of right hand, not elsewhere classified: Secondary | ICD-10-CM

## 2015-04-13 DIAGNOSIS — M25521 Pain in right elbow: Secondary | ICD-10-CM

## 2015-04-13 DIAGNOSIS — M25611 Stiffness of right shoulder, not elsewhere classified: Secondary | ICD-10-CM

## 2015-04-13 DIAGNOSIS — M25511 Pain in right shoulder: Secondary | ICD-10-CM

## 2015-04-13 DIAGNOSIS — M25631 Stiffness of right wrist, not elsewhere classified: Secondary | ICD-10-CM

## 2015-04-13 NOTE — Therapy (Unsigned)
[facility] [address] [place], [place], [address] Phone: [phone]   Fax:  [phone]  Physical Therapy Treatment  Patient Details  Name: Katherine Barnett MRN: [id number] Date of Birth: 08-31-1940 Referring Provider:  Carney Bern, PA  Encounter Date: 04/13/2015      PT End of Session - 04/13/15 0856    Visit Number 19   Number of Visits 20   Date for PT Re-Evaluation 05/13/15   PT Start Time 0844   PT Stop Time 0943   PT Time Calculation (min) 59 min   Activity Tolerance Patient tolerated treatment well   Behavior During Therapy [facility] for tasks assessed/performed      Past Medical History  Diagnosis Date  . Hypertension   . Thyroid disease     pt denies this (12/18/14)  . Diabetes mellitus without complication     type 2 - diet controlled  . Complication of anesthesia     vomited after colonoscopy    Past Surgical History  Procedure Laterality Date  . Eye surgery Bilateral     cataract surgery with lens implant  . Colonoscopy    . Orif humerus fracture Right 12/22/2014    Procedure: OPEN REDUCTION INTERNAL FIXATION (ORIF) RIGHT DISTAL HUMERUS FRACTURE;  Surgeon: Mcarthur Rossetti, MD;  Location: [place];  Service: Orthopedics;  Laterality: Right;    There were no vitals filed for this visit.  Visit Diagnosis:  Shoulder stiffness, right  Elbow stiffness, right  Right shoulder pain  Edema  Wrist stiffness, right  Stiffness of right hand joint  Right elbow pain      Subjective Assessment - 04/13/15 0852    Subjective No complains of pain in resting position, and pain with movements is less. Pt reports compliance with HEP.   Currently in Pain? No/denies            [facility] PT Assessment - 04/13/15 0001    Observation/Other Assessments   Skin Integrity decreased mobility of scar on posterior right elbow   Focus on Therapeutic Outcomes (FOTO)  65% limitation   Observation/Other  Assessments-Edema    Edema --  edema is present but still improving   Coordination   Fine Motor Movements are Fluid and Coordinated No  finger movement is improving                     [facility] Adult PT Treatment/Exercise - 04/13/15 0001    Exercises   Exercises Wrist   Elbow Exercises   Other elbow exercises DF2 x 20 in standing,  Palmarflexion to causes pain in elbow and shoulder rated as 8-9/10   Shoulder Exercises: Supine   External Rotation AAROM;Right;20 reps   Flexion AAROM;Both;20 reps   Other Supine Exercises Velcro board for pro/sup & flex/ext wrist x 4 each    Shoulder Exercises: Standing   Other Standing Exercises wall ladder 3x5 reached #5   Shoulder Exercises: Pulleys   Flexion 3 minutes   ABduction 3 minutes   Moist Heat Therapy   Number Minutes Moist Heat 15 Minutes   Moist Heat Location Shoulder;Other (comment)  in sitting   Manual Therapy   Manual Therapy Passive ROM   Edema Management Retrograde milliking to fingers and hand   Soft tissue mobilization to right hand, right elbow   Passive ROM right fingers, right wrist, right elbow, right shoulder flexion, right shoulder IR and ER with elbow at side  PT Short Term Goals - 04/12/15 0906    PT SHORT TERM GOAL #1   Title independent with initital HEP for shoulder flexion and elbow ROM   Time 4   Period Weeks   Status Achieved   PT SHORT TERM GOAL #2   Title pain in right shoulder and elbow decreased >/= 25%   Time 4   Period Weeks   Status Achieved   PT SHORT TERM GOAL #3   Title right shoulder flexion PROM >/= 115 degrees   Time 4   Period Weeks   Status On-going   PT SHORT TERM GOAL #4   Title right elbow extension >/= -5 degrees   Time 4   Period Weeks   Status On-going   PT SHORT TERM GOAL #5   Title right elbow flexion >/= 110 degrees PROM   Time 4   Period Weeks   Status On-going           PT Long Term Goals - 03/17/15 1209    PT LONG TERM GOAL  #1   Title independent with HEP and understand how to progress herself   Time 12   Period Weeks   Status On-going   PT LONG TERM GOAL #2   Title grip strength on right >/= 30 pounds   Time 12   Period Weeks   Status On-going   PT LONG TERM GOAL #3   Time 12   Period Weeks   Status On-going   PT LONG TERM GOAL #4   Title return to driving due to right shoulder strength 4/5   Time 12   Period Weeks   Status On-going   PT LONG TERM GOAL #5   Title able to cook due to right elbow AROM within functional limits   Time 12   Period Weeks   Status On-going               Plan - 04/13/15 3536    Clinical Impression Statement Pt shows improved ROM compare to yesterday as visual observed by PTA. Pt is slowly progressing toward goals. Pt will continue to benefit from skilled PT    Pt will benefit from skilled therapeutic intervention in order to improve on the following deficits Decreased range of motion;Increased fascial restricitons;Impaired UE functional use;Increased muscle spasms;Decreased endurance;Decreased activity tolerance;Pain;Impaired flexibility;Hypomobility;Decreased scar mobility;Decreased mobility;Decreased strength;Increased edema   Rehab Potential Good   Clinical Impairments Affecting Rehab Potential None   PT Frequency 3x / week   PT Duration 12 weeks   PT Treatment/Interventions ADLs/Self Care Home Management;Cryotherapy;Electrical Stimulation;Ultrasound;Moist Heat;Functional mobility training;Therapeutic activities;Therapeutic exercise;Neuromuscular re-education;Manual techniques;Patient/family education;Scar mobilization;Passive range of motion;Taping   PT Next Visit Plan Continue with pullies for Rt shoulder ROM, walllader, sTW and finemorotic rt fingers   PT Home Exercise Plan progress as needed   Consulted and Agree with Plan of Care Patient        Problem List Patient Active Problem List   Diagnosis Date Noted  . Right supracondylar humerus fracture;  right radial head/neck fracture 12/22/2014    NAUMANN-HOUEGNIFIO,ELKE PTA 04/13/2015, 9:26 AM  [facility] [address], [place], [place], [address] Phone: [phone]   Fax:  [phone]

## 2015-04-14 ENCOUNTER — Ambulatory Visit: Payer: Medicare Other | Admitting: Physical Therapy

## 2015-04-14 ENCOUNTER — Encounter: Payer: Self-pay | Admitting: Physical Therapy

## 2015-04-14 DIAGNOSIS — M25631 Stiffness of right wrist, not elsewhere classified: Secondary | ICD-10-CM

## 2015-04-14 DIAGNOSIS — M25611 Stiffness of right shoulder, not elsewhere classified: Secondary | ICD-10-CM

## 2015-04-14 DIAGNOSIS — M25511 Pain in right shoulder: Secondary | ICD-10-CM

## 2015-04-14 DIAGNOSIS — M25521 Pain in right elbow: Secondary | ICD-10-CM

## 2015-04-14 DIAGNOSIS — M25621 Stiffness of right elbow, not elsewhere classified: Secondary | ICD-10-CM

## 2015-04-14 DIAGNOSIS — R609 Edema, unspecified: Secondary | ICD-10-CM

## 2015-04-14 DIAGNOSIS — M25641 Stiffness of right hand, not elsewhere classified: Secondary | ICD-10-CM

## 2015-04-14 NOTE — Therapy (Addendum)
[facility] [address] [place], [place], [address] Phone: [phone]   Fax:  [phone]  Physical Therapy Treatment  Patient Details  Name: Katherine Barnett MRN: [id number] Date of Birth: [birth year] Referring Provider:  Pete Pelt, PA-C  Encounter Date: 04/14/2015      PT End of Session - 04/14/15 0856    Visit Number 20   Number of Visits 20   Date for PT Re-Evaluation 05/13/15   PT Start Time 0842   PT Stop Time 0940   PT Time Calculation (min) 58 min   Activity Tolerance Patient tolerated treatment well   Behavior During Therapy [facility] for tasks assessed/performed      Past Medical History  Diagnosis Date  . Hypertension   . Thyroid disease     pt denies this (12/18/14)  . Diabetes mellitus without complication     type 2 - diet controlled  . Complication of anesthesia     vomited after colonoscopy    Past Surgical History  Procedure Laterality Date  . Eye surgery Bilateral     cataract surgery with lens implant  . Colonoscopy    . Orif humerus fracture Right 12/22/2014    Procedure: OPEN REDUCTION INTERNAL FIXATION (ORIF) RIGHT DISTAL HUMERUS FRACTURE;  Surgeon: Mcarthur Rossetti, MD;  Location: [facility];  Service: Orthopedics;  Laterality: Right;    There were no vitals filed for this visit.  Visit Diagnosis:  Shoulder stiffness, right  Elbow stiffness, right  Right shoulder pain  Edema  Wrist stiffness, right  Stiffness of right hand joint  Right elbow pain      Subjective Assessment - 04/14/15 0853    Subjective Pain with movement is less, and no complain of pain in resting position.    Pain Score 3    Pain Location Shoulder   Pain Orientation Right   Pain Descriptors / Indicators Tightness   Pain Type Surgical pain   Pain Onset More than a month ago   Pain Frequency Intermittent   Multiple Pain Sites No            [facility] PT Assessment - 04/14/15 0001    Assessment   Medical Diagnosis s/p ORIF right Supra Condylar Humerus Fracture 6/1/201; Right proimal humerus  fracture   Onset Date/Surgical Date 12/22/14   Hand Dominance Right   Precautions   Precautions Shoulder   Type of Shoulder Precautions No abduction right shoulder, no lifting 5 pounds; gentle ROM right elbow, right forearm, and shoulder   Precaution Comments osteoporosis   Balance Screen   Has the patient fallen in the past 6 months Yes   How many times? 1   Has the patient had a decrease in activity level because of a fear of falling?  No   Is the patient reluctant to leave their home because of a fear of falling?  No   Prior Function   Level of Independence Independent with basic ADLs   Dressing Minimal   Grooming Moderate   Meal Prep Moderate   Light Housekeeping Moderate   Driving No   Observation/Other Assessments   Skin Integrity decreased mobility of scar on posterior right elbow   Focus on Therapeutic Outcomes (FOTO)  65% limitation   Observation/Other Assessments-Edema    Edema --  edema is present, but improving   Coordination   Fine Motor Movements are Fluid and Coordinated No  finger movement is improving   ROM / Strength   AROM /  PROM / Strength AROM   AROM   AROM Assessment Site Shoulder   Right/Left Shoulder Right   Right Shoulder Flexion 65 Degrees   Right Shoulder ABduction 50 Degrees   Right/Left Elbow Right   Right Elbow Flexion 100   Right Elbow Extension -13   Right/Left Forearm Right   Right Forearm Pronation 90 Degrees   Right Forearm Supination 10 Degrees   Right/Left Wrist Right   Right Wrist Extension 45 Degrees   Right Wrist Flexion 40 Degrees   Right/Left Finger Right   Right Composite Finger Flexion 50%   PROM   PROM Assessment Site Shoulder   Right/Left Shoulder Right   Right Shoulder Flexion 115 Degrees   Right/Left Elbow Right   Right Elbow Flexion 110   Right Elbow Extension -9   Right Forearm Supination 10 Degrees    Right/Left Wrist Right   Right Wrist Extension 50 Degrees   Right Wrist Flexion 60 Degrees   Strength   Strength Assessment Site Hand   Right/Left hand Right   Right Hand Grip (lbs) 5      PT treatment: Shoulder supine External Rotation   AAROM using cane 20reps Flexion     Using cane Both 20reps  Shoulder exercises Standing Velcro board in standing  Velcro board for pro/sup & flex/ext wrist x 4  Wall ladder     3x 5 reached peg #5,  challenging for pt  Shoulder Exercises: Pulleys Flexion     3 minutes Abduction    3 minutes in standing  Manual Therapy   Passive ROM Rt elbow and shoulderin all planes to pt's tolerance Edema Management   Retrograde milking to fingers and hand Soft tissue mobilization  Right hand fingers, wrist, forearm and shoulder with PROM for       elongation                             PT Short Term Goals - 04/14/15 0906    PT SHORT TERM GOAL #3   Title right shoulder flexion PROM >/= 115 degrees  As of 04/14/15  115degrees   Time 4   Period Weeks   Status Achieved   PT SHORT TERM GOAL #4   Title right elbow extension >/= -5 degrees   Time 4   Period Weeks   Status On-going   PT SHORT TERM GOAL #5   Title right elbow flexion >/= 110 degrees PROM  as of 04/14/15   Time 4   Period Weeks   Status Achieved           PT Long Term Goals - 04/14/15 5456    PT LONG TERM GOAL #1   Title independent with HEP and understand how to progress herself   Time 12   Period Weeks   Status On-going   PT LONG TERM GOAL #2   Title grip strength on right >/= 30 pounds   Time 12   Period Weeks   Status On-going   PT LONG TERM GOAL #3   Title ability to put her bra on due to right shoulder AROM within functional limits   Time 12   Period Weeks   Status On-going   PT LONG TERM GOAL #4   Title return to driving due to right shoulder strength 4/5   Time 12   Period Weeks   Status On-going   PT LONG TERM GOAL #5   Title able to  cook due to right elbow AROM within functional limits   Time 12   Period Weeks   Status On-going               Plan - 04/14/15 0857    Clinical Impression Statement Pt with more ease with activity on wallladder. AROM in degrees: SHOULDER: flexion 65 with elbow flexed, abduction 50, ELBOW: supination        ,pronation        , WRIST: palmarflexion           palmarextension         Pt will benefit from skilled therapeutic intervention in order to improve on the following deficits Decreased range of motion;Increased fascial restricitons;Impaired UE functional use;Increased muscle spasms;Decreased endurance;Decreased activity tolerance;Pain;Impaired flexibility;Hypomobility;Decreased scar mobility;Decreased mobility;Decreased strength;Increased edema   Rehab Potential Good   PT Frequency 3x / week   PT Duration 12 weeks   PT Treatment/Interventions ADLs/Self Care Home Management;Cryotherapy;Electrical Stimulation;Ultrasound;Moist Heat;Functional mobility training;Therapeutic activities;Therapeutic exercise;Neuromuscular re-education;Manual techniques;Patient/family education;Scar mobilization;Passive range of motion;Taping   PT Next Visit Plan Continue with pullies for Rt shoulder ROM, walllader, sTW and finemorotic rt fingers   PT Home Exercise Plan progress as needed   Consulted and Agree with Plan of Care Patient        Problem List Patient Active Problem List   Diagnosis Date Noted  . Right supracondylar humerus fracture; right radial head/neck fracture 12/22/2014    NAUMANN-HOUEGNIFIO,ELKE PTA 04/14/2015, 1:59 PM  [facility] [address], [place], [place], [address] Phone: [phone]   Fax:  [phone]    Physical Therapy Progress Note  Dates of Reporting Period: 03/12/2015 to 04/14/2015  Objective Reports of Subjective Statement: Pain with movements. Getting easier to use right arm.   Objective  Measurements: AROM of right shoulder flexion 65 and abduction 50.  PROM of right shoulder flexion 115 degrees.  Right elbow AROM  In degrees: flexion 100, extension -13, supination 10.  Right elbow PROM in degrees: flexion 110, extension -9, supination 18. Right wrist PROM in degrees: flexion 50 and extension 60.  Goal Update: Patient has not met LTG due to limited ROM affecting her to use her right arm for activities.   Plan: Physical therapy focus on increasing ROM and strength of right shoulder and elbow ROM.   Reason Skilled Services are Required: Patient has osteoporosis therefore it is important she is guided with her motion to be careful of the bone integrity.  Earlie Counts, PT 04/14/2015 3:19 PM

## 2015-04-16 ENCOUNTER — Encounter: Payer: Medicare Other | Admitting: Physical Therapy

## 2015-04-19 ENCOUNTER — Encounter: Payer: Medicare Other | Admitting: Physical Therapy

## 2015-04-20 ENCOUNTER — Encounter: Payer: Self-pay | Admitting: Physical Therapy

## 2015-04-20 ENCOUNTER — Ambulatory Visit: Payer: Medicare Other | Admitting: Physical Therapy

## 2015-04-20 DIAGNOSIS — R609 Edema, unspecified: Secondary | ICD-10-CM

## 2015-04-20 DIAGNOSIS — M25641 Stiffness of right hand, not elsewhere classified: Secondary | ICD-10-CM

## 2015-04-20 DIAGNOSIS — M25511 Pain in right shoulder: Secondary | ICD-10-CM

## 2015-04-20 DIAGNOSIS — M25611 Stiffness of right shoulder, not elsewhere classified: Secondary | ICD-10-CM

## 2015-04-20 DIAGNOSIS — M25521 Pain in right elbow: Secondary | ICD-10-CM

## 2015-04-20 DIAGNOSIS — M25621 Stiffness of right elbow, not elsewhere classified: Secondary | ICD-10-CM

## 2015-04-20 DIAGNOSIS — M25631 Stiffness of right wrist, not elsewhere classified: Secondary | ICD-10-CM

## 2015-04-20 NOTE — Therapy (Signed)
[facility] [address], [place], [place], [address] Phone: [phone]   Fax:  [phone]  Physical Therapy Treatment  Patient Details  Name: Katherine Barnett MRN: [id number] Date of Birth: 01-30-1941 Referring Provider:  Pete Pelt, PA-C  Encounter Date: 04/20/2015      PT End of Session - 04/20/15 0929    Visit Number 21  KX modifier   Number of Visits 30   Date for PT Re-Evaluation 05/13/15   PT Start Time 0842   PT Stop Time 0940   PT Time Calculation (min) 58 min   Activity Tolerance Patient tolerated treatment well   Behavior During Therapy [facility] for tasks assessed/performed      Past Medical History  Diagnosis Date  . Hypertension   . Thyroid disease     pt denies this (12/18/14)  . Diabetes mellitus without complication     type 2 - diet controlled  . Complication of anesthesia     vomited after colonoscopy    Past Surgical History  Procedure Laterality Date  . Eye surgery Bilateral     cataract surgery with lens implant  . Colonoscopy    . Orif humerus fracture Right 12/22/2014    Procedure: OPEN REDUCTION INTERNAL FIXATION (ORIF) RIGHT DISTAL HUMERUS FRACTURE;  Surgeon: Mcarthur Rossetti, MD;  Location: [facility];  Service: Orthopedics;  Laterality: Right;    There were no vitals filed for this visit.  Visit Diagnosis:  Elbow stiffness, right  Shoulder stiffness, right  Right shoulder pain  Edema  Wrist stiffness, right  Stiffness of right hand joint  Right elbow pain      Subjective Assessment - 04/20/15 0853    Subjective No complain of pain, only tightness and stiffness. All functional activities are limited due to decreased ROM, unable to drive.    Currently in Pain? No/denies  Only stiffness and tightness   Pain Location Shoulder   Pain Orientation Right   Pain Descriptors / Indicators Tightness   Pain Type Surgical pain   Pain Onset More than a month ago   Pain  Frequency Intermittent   Multiple Pain Sites No            [facility] PT Assessment - 04/20/15 0001    Assessment   Medical Diagnosis s/p ORIF right Supra Condylar Humerus Fracture 6/1/201; Right proimal humerus  fracture   Onset Date/Surgical Date 12/22/14   Hand Dominance Right   Precautions   Precautions Shoulder   Precaution Comments osteoporosis   Balance Screen   Has the patient fallen in the past 6 months Yes   How many times? 1   Has the patient had a decrease in activity level because of a fear of falling?  No   Is the patient reluctant to leave their home because of a fear of falling?  No   Prior Function   Level of Independence Independent with basic ADLs   Dressing Minimal   Grooming Moderate   Meal Prep Moderate   Light Housekeeping Moderate   Driving No   Observation/Other Assessments   Skin Integrity decreased mobility of scar on posterior right elbow   Focus on Therapeutic Outcomes (FOTO)  65% limitation   Observation/Other Assessments-Edema    Edema --  edema is improving now mainly in fingers and hand   Coordination   Fine Motor Movements are Fluid and Coordinated No  finger movement is improving   ROM / Strength   AROM / PROM /  Strength AROM   AROM   Right Forearm Pronation 90 Degrees   Right Forearm Supination 15 Degrees   Right/Left Wrist Right   Right Wrist Extension 50 Degrees   Right Wrist Flexion 40 Degrees   Right/Left Finger Right   Right Composite Finger Flexion 50%   PROM   PROM Assessment Site Shoulder   Right/Left Shoulder Right   Right Shoulder Flexion 115 Degrees   Right/Left Elbow Right   Right Elbow Flexion 110   Right Elbow Extension -9   Right Forearm Supination 10 Degrees   Right/Left Wrist Right   Right Wrist Extension 50 Degrees   Right Wrist Flexion 60 Degrees   Strength   Strength Assessment Site Hand   Right/Left hand Right   Right Hand Grip (lbs) 5                     [facility] Adult PT Treatment/Exercise  - 04/20/15 0001    Exercises   Exercises Wrist   Shoulder Exercises: Supine   External Rotation AAROM;Right;20 reps   Flexion AAROM;Both;20 reps  using cane   Other Supine Exercises Velcro board for pro/sup & flex/ext wrist x 4 each   pace and ROM improved with activity   Shoulder Exercises: Standing   Other Standing Exercises wall ladder 3x5 reached #5   Shoulder Exercises: Pulleys   Flexion 3 minutes   ABduction 3 minutes  in standing   Moist Heat Therapy   Number Minutes Moist Heat 15 Minutes   Moist Heat Location Shoulder  in sitting   Manual Therapy   Manual Therapy Passive ROM   Edema Management Retrograde milliking to fingers and hand   Soft tissue mobilization to right hand, right elbow   Passive ROM right fingers, right wrist, right elbow, right shoulder flexion, right shoulder IR and ER with elbow at side                  PT Short Term Goals - 04/20/15 1700    PT SHORT TERM GOAL #1   Title independent with initital HEP for shoulder flexion and elbow ROM   Time 4   Period Weeks   Status Achieved   PT SHORT TERM GOAL #2   Title pain in right shoulder and elbow decreased >/= 25%   Time 4   Period Weeks   Status Achieved   PT SHORT TERM GOAL #3   Title right shoulder flexion PROM >/= 115 degrees   Time 4   Period Weeks   Status Achieved   PT SHORT TERM GOAL #4   Title right elbow extension >/= -5 degrees   Time 4   Period Weeks   Status On-going   PT SHORT TERM GOAL #5   Title right elbow flexion >/= 110 degrees PROM   Time 4   Period Weeks   Status Achieved           PT Long Term Goals - 04/14/15 1884    PT LONG TERM GOAL #1   Title independent with HEP and understand how to progress herself   Time 12   Period Weeks   Status On-going   PT LONG TERM GOAL #2   Title grip strength on right >/= 30 pounds   Time 12   Period Weeks   Status On-going   PT LONG TERM GOAL #3   Title ability to put her bra on due to right shoulder AROM  within functional limits   Time  12   Period Weeks   Status On-going   PT LONG TERM GOAL #4   Title return to driving due to right shoulder strength 4/5   Time 12   Period Weeks   Status On-going   PT LONG TERM GOAL #5   Title able to cook due to right elbow AROM within functional limits   Time 12   Period Weeks   Status On-going               Plan - 04/20/15 0929    Clinical Impression Statement Pt continues to imporve with pace with activities in PT.Marland Kitchen AROM Rt elbow flexion        extension         supination         pronation. Rt wrist        palmar flexion         dorsal extension        Pt will continue to benefit from skilled PT.   Pt will benefit from skilled therapeutic intervention in order to improve on the following deficits Decreased range of motion;Increased fascial restricitons;Impaired UE functional use;Increased muscle spasms;Decreased endurance;Decreased activity tolerance;Pain;Impaired flexibility;Hypomobility;Decreased scar mobility;Decreased mobility;Decreased strength;Increased edema   Rehab Potential Good   PT Frequency 3x / week   PT Duration 12 weeks   PT Treatment/Interventions ADLs/Self Care Home Management;Cryotherapy;Electrical Stimulation;Ultrasound;Moist Heat;Functional mobility training;Therapeutic activities;Therapeutic exercise;Neuromuscular re-education;Manual techniques;Patient/family education;Scar mobilization;Passive range of motion;Taping   PT Next Visit Plan finish MD note. measure strength. Continue wallladder, STW finemotoric fingers   Consulted and Agree with Plan of Care Patient        Problem List Patient Active Problem List   Diagnosis Date Noted  . Right supracondylar humerus fracture; right radial head/neck fracture 12/22/2014    NAUMANN-HOUEGNIFIO,Ediel Unangst PTA 04/20/2015, 5:05 PM  [facility] [address] [place], [place], [address] Phone: [phone]   Fax:   [phone]

## 2015-04-21 ENCOUNTER — Ambulatory Visit: Payer: Medicare Other | Admitting: Physical Therapy

## 2015-04-21 ENCOUNTER — Encounter: Payer: Self-pay | Admitting: Physical Therapy

## 2015-04-21 DIAGNOSIS — M25641 Stiffness of right hand, not elsewhere classified: Secondary | ICD-10-CM

## 2015-04-21 DIAGNOSIS — M25611 Stiffness of right shoulder, not elsewhere classified: Secondary | ICD-10-CM | POA: Diagnosis not present

## 2015-04-21 DIAGNOSIS — M25621 Stiffness of right elbow, not elsewhere classified: Secondary | ICD-10-CM

## 2015-04-21 DIAGNOSIS — R609 Edema, unspecified: Secondary | ICD-10-CM

## 2015-04-21 DIAGNOSIS — M25511 Pain in right shoulder: Secondary | ICD-10-CM

## 2015-04-21 DIAGNOSIS — M25631 Stiffness of right wrist, not elsewhere classified: Secondary | ICD-10-CM

## 2015-04-21 DIAGNOSIS — M25521 Pain in right elbow: Secondary | ICD-10-CM

## 2015-04-21 NOTE — Therapy (Signed)
[facility] [address] [place], [place], [address] Phone: [phone]   Fax:  [phone]  Physical Therapy Treatment  Patient Details  Name: Katherine Barnett MRN: [id number] Date of Birth: 05/06/1941 Referring Provider:  Pete Pelt, PA-C  Encounter Date: 04/21/2015      PT End of Session - 04/21/15 1331    Visit Number 22   Number of Visits 30   Date for PT Re-Evaluation 05/13/15   PT Start Time 0845   PT Stop Time 0942   PT Time Calculation (min) 57 min   Activity Tolerance Patient tolerated treatment well   Behavior During Therapy [facility] for tasks assessed/performed      Past Medical History  Diagnosis Date  . Hypertension   . Thyroid disease     pt denies this (12/18/14)  . Diabetes mellitus without complication     type 2 - diet controlled  . Complication of anesthesia     vomited after colonoscopy    Past Surgical History  Procedure Laterality Date  . Eye surgery Bilateral     cataract surgery with lens implant  . Colonoscopy    . Orif humerus fracture Right 12/22/2014    Procedure: OPEN REDUCTION INTERNAL FIXATION (ORIF) RIGHT DISTAL HUMERUS FRACTURE;  Surgeon: Mcarthur Rossetti, MD;  Location: [place];  Service: Orthopedics;  Laterality: Right;    There were no vitals filed for this visit.  Visit Diagnosis:  Elbow stiffness, right  Shoulder stiffness, right  Right shoulder pain  Edema  Wrist stiffness, right  Stiffness of right hand joint  Right elbow pain      Subjective Assessment - 04/21/15 0905    Subjective No complain of pain in Rt UE, only stiffness and tighness. All functional activities are limited due to decreased ROM and strength, as unable to drive or flush the toilet   Currently in Pain? No/denies  pain with activities in fingers, wrist nd shoulder    Pain Score --  with activities 7/10   Pain Location Shoulder   Pain Orientation Right   Pain Descriptors /  Indicators Tightness  stiffness   Pain Type Surgical pain   Pain Onset More than a month ago   Pain Frequency Intermittent   Multiple Pain Sites No            [facility] PT Assessment - 04/21/15 0001    Assessment   Medical Diagnosis s/p ORIF right Supra Condylar Humerus Fracture 6/1/201; Right proimal humerus  fracture   Onset Date/Surgical Date 12/22/14   Hand Dominance Right   Precautions   Precautions Shoulder   Precaution Comments osteoporosis   Balance Screen   Has the patient fallen in the past 6 months Yes   How many times? 1   Has the patient had a decrease in activity level because of a fear of falling?  No   Is the patient reluctant to leave their home because of a fear of falling?  No   Prior Function   Level of Independence Independent with basic ADLs   Dressing Minimal   Grooming Other(comment)  Independent   Meal Prep Minimal   Light Housekeeping Minimal   Driving No   Observation/Other Assessments   Skin Integrity decreased mobility of scar on posterior right elbow   Focus on Therapeutic Outcomes (FOTO)  65% limitation  as of 04/13/2015   Observation/Other Assessments-Edema    Edema --  edema is improving, remaining mainly in fingers and wrist  Coordination   Fine Motor Movements are Fluid and Coordinated No   Finger Nose Finger Test --  Finger movement is improving   ROM / Strength   AROM / PROM / Strength AROM   AROM   AROM Assessment Site Shoulder   Right/Left Shoulder Right   Right Shoulder Extension 35 Degrees   Right Shoulder Flexion 85 Degrees  with elbow flexed, and compensatory movement   Right Shoulder ABduction 50 Degrees  with compensatory movement   Right/Left Elbow Right   Right Elbow Flexion 100   Right Elbow Extension -13   Right/Left Forearm Right   Right Forearm Pronation 90 Degrees   Right Forearm Supination 15 Degrees   Right/Left Wrist Right   Right Wrist Extension 50 Degrees   Right Wrist Flexion 40 Degrees   Right/Left  Finger Right   Right Composite Finger Flexion 50%   PROM   PROM Assessment Site Shoulder   Right/Left Shoulder Right   Right Shoulder Flexion 115 Degrees   Right/Left Elbow Right   Right Elbow Flexion 110   Right Elbow Extension -9   Right Forearm Supination 10 Degrees   Right Wrist Extension 50 Degrees   Right Wrist Flexion 60 Degrees   Strength   Strength Assessment Site Hand   Right/Left hand Right   Right Hand Grip (lbs) 5                     [facility] Adult PT Treatment/Exercise - 04/21/15 0001    Exercises   Exercises Wrist   Elbow Exercises   Elbow Flexion AROM;20 reps;Standing  leaning agains wall to prevent compensatory movement   Shoulder Exercises: Supine   Flexion AAROM;Both;20 reps  using cane   Other Supine Exercises Velcro board for pro/sup & flex/ext wrist x 4 each   pace & ROM with activity is progressing    Shoulder Exercises: Standing   Other Standing Exercises wall ladder 4x5 reached #5   Shoulder Exercises: Pulleys   Flexion 3 minutes   ABduction 3 minutes   Wrist Exercises   Forearm Supination AROM;Both;20 reps;Standing  leaning agains wall   Wrist Flexion AROM;Both;20 reps;Standing   Wrist Extension AROM;20 reps;Standing   Moist Heat Therapy   Number Minutes Moist Heat 15 Minutes   Moist Heat Location Shoulder   Manual Therapy   Manual Therapy Passive ROM   Edema Management Retrograde milliking to fingers and hand   Soft tissue mobilization to right hand, right elbow   Passive ROM right fingers, right wrist, right elbow, right shoulder flexion, right shoulder IR and ER with elbow at side                  PT Short Term Goals - 04/20/15 1700    PT SHORT TERM GOAL #1   Title independent with initital HEP for shoulder flexion and elbow ROM   Time 4   Period Weeks   Status Achieved   PT SHORT TERM GOAL #2   Title pain in right shoulder and elbow decreased >/= 25%   Time 4   Period Weeks   Status Achieved   PT SHORT TERM  GOAL #3   Title right shoulder flexion PROM >/= 115 degrees   Time 4   Period Weeks   Status Achieved   PT SHORT TERM GOAL #4   Title right elbow extension >/= -5 degrees   Time 4   Period Weeks   Status On-going   PT SHORT TERM GOAL #  5   Title right elbow flexion >/= 110 degrees PROM   Time 4   Period Weeks   Status Achieved           PT Long Term Goals - 04/21/15 1334    PT LONG TERM GOAL #1   Title independent with HEP and understand how to progress herself   Time 12   Period Weeks   Status On-going   PT LONG TERM GOAL #2   Title grip strength on right >/= 30 pounds   Time 12   Period Weeks   Status On-going   PT LONG TERM GOAL #3   Title ability to put her bra on due to right shoulder AROM within functional limits   Time 12   Period Weeks   Status On-going   PT LONG TERM GOAL #4   Title return to driving due to right shoulder strength 4/5   Time 12   Period Weeks   Status On-going   PT LONG TERM GOAL #5   Title able to cook due to right elbow AROM within functional limits   Time 12   Period Weeks   Status On-going               Plan - 04/21/15 1332    Clinical Impression Statement Pt continues to improve with pace with activities in PT. AROM in shoulder, elbow and wrist pl see evaluation section. Pt will continue to benefit from skilled PT to improve ROM, strength and endurance   Pt will benefit from skilled therapeutic intervention in order to improve on the following deficits Decreased range of motion;Increased fascial restricitons;Impaired UE functional use;Increased muscle spasms;Decreased endurance;Decreased activity tolerance;Pain;Impaired flexibility;Hypomobility;Decreased scar mobility;Decreased mobility;Decreased strength;Increased edema   Rehab Potential Good   PT Frequency 3x / week   PT Duration 12 weeks   PT Treatment/Interventions ADLs/Self Care Home Management;Cryotherapy;Electrical Stimulation;Ultrasound;Moist Heat;Functional mobility  training;Therapeutic activities;Therapeutic exercise;Neuromuscular re-education;Manual techniques;Patient/family education;Scar mobilization;Passive range of motion;Taping   PT Next Visit Plan Continue with wallader, soft tissue work   PT Home Exercise Plan progress as needed   Consulted and Agree with Plan of Care Patient        Problem List Patient Active Problem List   Diagnosis Date Noted  . Right supracondylar humerus fracture; right radial head/neck fracture 12/22/2014    NAUMANN-HOUEGNIFIO,ELKE PTA 04/21/2015, 1:38 PM  [facility] [address] [place], [place], [address] Phone: [phone]   Fax:  [phone]

## 2015-04-23 ENCOUNTER — Encounter: Payer: Self-pay | Admitting: Physical Therapy

## 2015-04-23 ENCOUNTER — Ambulatory Visit: Payer: Medicare Other | Admitting: Physical Therapy

## 2015-04-23 DIAGNOSIS — M25611 Stiffness of right shoulder, not elsewhere classified: Secondary | ICD-10-CM

## 2015-04-23 DIAGNOSIS — M25511 Pain in right shoulder: Secondary | ICD-10-CM

## 2015-04-23 DIAGNOSIS — M25631 Stiffness of right wrist, not elsewhere classified: Secondary | ICD-10-CM

## 2015-04-23 DIAGNOSIS — M25621 Stiffness of right elbow, not elsewhere classified: Secondary | ICD-10-CM

## 2015-04-23 DIAGNOSIS — R609 Edema, unspecified: Secondary | ICD-10-CM

## 2015-04-23 DIAGNOSIS — M25641 Stiffness of right hand, not elsewhere classified: Secondary | ICD-10-CM

## 2015-04-23 NOTE — Therapy (Signed)
[facility] [address], [place], [place], [address] Phone: [phone]   Fax:  [phone]  Physical Therapy Treatment  Patient Details  Name: Katherine Barnett MRN: [id number] Date of Birth: 1941-03-15 Referring Provider:  Carney Bern, PA  Encounter Date: 04/23/2015      PT End of Session - 04/23/15 0905    Visit Number 23   Number of Visits 30   Date for PT Re-Evaluation 05/13/15   PT Start Time 0840   PT Stop Time 0943   PT Time Calculation (min) 63 min   Activity Tolerance Patient tolerated treatment well   Behavior During Therapy [facility] for tasks assessed/performed      Past Medical History  Diagnosis Date  . Hypertension   . Thyroid disease     pt denies this (12/18/14)  . Diabetes mellitus without complication     type 2 - diet controlled  . Complication of anesthesia     vomited after colonoscopy    Past Surgical History  Procedure Laterality Date  . Eye surgery Bilateral     cataract surgery with lens implant  . Colonoscopy    . Orif humerus fracture Right 12/22/2014    Procedure: OPEN REDUCTION INTERNAL FIXATION (ORIF) RIGHT DISTAL HUMERUS FRACTURE;  Surgeon: Mcarthur Rossetti, MD;  Location: [place];  Service: Orthopedics;  Laterality: Right;    There were no vitals filed for this visit.  Visit Diagnosis:  Elbow stiffness, right  Shoulder stiffness, right  Right shoulder pain  Edema  Wrist stiffness, right  Stiffness of right hand joint      Subjective Assessment - 04/23/15 0856    Subjective Pt saw MD on Wednesday, per pt report he is pleased with progression, but until next visit he expects 90degrees of shoulder flexion and improvement with supination. No complains of pain.   Currently in Pain? No/denies  usually no pain but with movements up to 4-6/10   Multiple Pain Sites No                         [facility] Adult PT Treatment/Exercise - 04/23/15 0001     Exercises   Exercises Wrist   Elbow Exercises   Elbow Flexion AROM;20 reps;Standing  standing against wall to prevent compensatory movement   Shoulder Exercises: Supine   Other Supine Exercises Velcro board for pro/sup & flex/ext wrist x 4 each   with focus on supination   Shoulder Exercises: Standing   Other Standing Exercises wall ladder 4x5 reached #6    Shoulder Exercises: Pulleys   Flexion 3 minutes  plus 2  min = 5   ABduction 3 minutes   Wrist Exercises   Forearm Supination AROM;Both;20 reps;Standing  leaning agains wall   Forearm Pronation AROM   Wrist Flexion AROM;Both;20 reps;Standing   Wrist Extension AROM;20 reps;Standing   Moist Heat Therapy   Number Minutes Moist Heat 10 Minutes   Moist Heat Location Elbow   Manual Therapy   Manual Therapy Passive ROM  to improve elbow flexion and supination   Edema Management Retrograde milliking to fingers and hand   Soft tissue mobilization to right forearm and surrounding shoulder muscles   Passive ROM rt elbow and shoulder                  PT Short Term Goals - 04/20/15 1700    PT SHORT TERM GOAL #1   Title independent with initital HEP for  shoulder flexion and elbow ROM   Time 4   Period Weeks   Status Achieved   PT SHORT TERM GOAL #2   Title pain in right shoulder and elbow decreased >/= 25%   Time 4   Period Weeks   Status Achieved   PT SHORT TERM GOAL #3   Title right shoulder flexion PROM >/= 115 degrees   Time 4   Period Weeks   Status Achieved   PT SHORT TERM GOAL #4   Title right elbow extension >/= -5 degrees   Time 4   Period Weeks   Status On-going   PT SHORT TERM GOAL #5   Title right elbow flexion >/= 110 degrees PROM   Time 4   Period Weeks   Status Achieved           PT Long Term Goals - 04/21/15 1334    PT LONG TERM GOAL #1   Title independent with HEP and understand how to progress herself   Time 12   Period Weeks   Status On-going   PT LONG TERM GOAL #2   Title grip  strength on right >/= 30 pounds   Time 12   Period Weeks   Status On-going   PT LONG TERM GOAL #3   Title ability to put her bra on due to right shoulder AROM within functional limits   Time 12   Period Weeks   Status On-going   PT LONG TERM GOAL #4   Title return to driving due to right shoulder strength 4/5   Time 12   Period Weeks   Status On-going   PT LONG TERM GOAL #5   Title able to cook due to right elbow AROM within functional limits   Time 12   Period Weeks   Status On-going               Plan - 04/23/15 0905    Clinical Impression Statement Pt is very determined with activities in Pt and wishes to improve flexion and supination in Rt UE. Pt will continue to benefit from skilled PT to improve ROM, strength and endurance Rt UE   Pt will benefit from skilled therapeutic intervention in order to improve on the following deficits Decreased range of motion;Increased fascial restricitons;Impaired UE functional use;Increased muscle spasms;Decreased endurance;Decreased activity tolerance;Pain;Impaired flexibility;Hypomobility;Decreased scar mobility;Decreased mobility;Decreased strength;Increased edema   Rehab Potential Good   PT Frequency 3x / week   PT Duration 12 weeks   PT Treatment/Interventions ADLs/Self Care Home Management;Cryotherapy;Electrical Stimulation;Ultrasound;Moist Heat;Functional mobility training;Therapeutic activities;Therapeutic exercise;Neuromuscular re-education;Manual techniques;Patient/family education;Scar mobilization;Passive range of motion;Taping   PT Next Visit Plan Focus on shoulder flexion and supination of Rt elbow.   PT Home Exercise Plan progress as needed   Consulted and Agree with Plan of Care Patient        Problem List Patient Active Problem List   Diagnosis Date Noted  . Right supracondylar humerus fracture; right radial head/neck fracture 12/22/2014    NAUMANN-HOUEGNIFIO,ELKE  PTA  04/23/2015, 9:36 AM  [facility] [address] [place], [place], [address] Phone: [phone]   Fax:  [phone]

## 2015-04-28 ENCOUNTER — Ambulatory Visit: Payer: Medicare Other | Attending: Physician Assistant

## 2015-04-28 DIAGNOSIS — M25631 Stiffness of right wrist, not elsewhere classified: Secondary | ICD-10-CM | POA: Insufficient documentation

## 2015-04-28 DIAGNOSIS — M25611 Stiffness of right shoulder, not elsewhere classified: Secondary | ICD-10-CM | POA: Insufficient documentation

## 2015-04-28 DIAGNOSIS — M25621 Stiffness of right elbow, not elsewhere classified: Secondary | ICD-10-CM | POA: Insufficient documentation

## 2015-04-28 DIAGNOSIS — M25511 Pain in right shoulder: Secondary | ICD-10-CM

## 2015-04-28 DIAGNOSIS — M25521 Pain in right elbow: Secondary | ICD-10-CM | POA: Diagnosis present

## 2015-04-28 DIAGNOSIS — M25641 Stiffness of right hand, not elsewhere classified: Secondary | ICD-10-CM | POA: Diagnosis present

## 2015-04-28 NOTE — Therapy (Signed)
[facility] [address] [place], [place], [address] Phone: [phone]   Fax:  [phone]  Physical Therapy Treatment  Patient Details  Name: Katherine Barnett MRN: [id number] Date of Birth: [birth year] Referring Provider:  Pete Pelt, PA-C  Encounter Date: 04/28/2015      PT End of Session - 04/28/15 0927    Visit Number 24   Number of Visits 30   Date for PT Re-Evaluation 05/13/15   PT Start Time 0841   PT Stop Time 0940   PT Time Calculation (min) 59 min      Past Medical History  Diagnosis Date  . Hypertension   . Thyroid disease     pt denies this (12/18/14)  . Diabetes mellitus without complication ([place])     type 2 - diet controlled  . Complication of anesthesia     vomited after colonoscopy    Past Surgical History  Procedure Laterality Date  . Eye surgery Bilateral     cataract surgery with lens implant  . Colonoscopy    . Orif humerus fracture Right 12/22/2014    Procedure: OPEN REDUCTION INTERNAL FIXATION (ORIF) RIGHT DISTAL HUMERUS FRACTURE;  Surgeon: Mcarthur Rossetti, MD;  Location: [place];  Service: Orthopedics;  Laterality: Right;    There were no vitals filed for this visit.  Visit Diagnosis:  Elbow stiffness, right  Shoulder stiffness, right  Right shoulder pain  Wrist stiffness, right      Subjective Assessment - 04/28/15 0846    Subjective No pain today.     Currently in Pain? No/denies                         [facility] Adult PT Treatment/Exercise - 04/28/15 0001    Exercises   Exercises Wrist   Elbow Exercises   Elbow Flexion AROM;20 reps;Standing  standing against wall to prevent compensatory movement   Shoulder Exercises: Supine   Other Supine Exercises Velcro board (narrow strip) for pro/sup & flex/ext wrist x 4 each    Shoulder Exercises: Standing   Other Standing Exercises wall ladder 4x5 reached #6    Other Standing Exercises shoulder  shrugs x 20   Shoulder Exercises: Pulleys   Flexion 3 minutes  plus 2  min = 5   ABduction 3 minutes   Wrist Exercises   Forearm Supination AROM;Both;20 reps;Standing  leaning agains wall   Forearm Pronation AROM   Wrist Flexion AROM;Both;20 reps;Standing   Wrist Extension AROM;20 reps;Standing   Moist Heat Therapy   Number Minutes Moist Heat 10 Minutes   Moist Heat Location Elbow   Manual Therapy   Manual Therapy Passive ROM  to improve elbow flexion and supination                  PT Short Term Goals - 04/20/15 1700    PT SHORT TERM GOAL #1   Title independent with initital HEP for shoulder flexion and elbow ROM   Time 4   Period Weeks   Status Achieved   PT SHORT TERM GOAL #2   Title pain in right shoulder and elbow decreased >/= 25%   Time 4   Period Weeks   Status Achieved   PT SHORT TERM GOAL #3   Title right shoulder flexion PROM >/= 115 degrees   Time 4   Period Weeks   Status Achieved   PT SHORT TERM GOAL #4   Title right elbow  extension >/= -5 degrees   Time 4   Period Weeks   Status On-going   PT SHORT TERM GOAL #5   Title right elbow flexion >/= 110 degrees PROM   Time 4   Period Weeks   Status Achieved           PT Long Term Goals - 04/21/15 1334    PT LONG TERM GOAL #1   Title independent with HEP and understand how to progress herself   Time 12   Period Weeks   Status On-going   PT LONG TERM GOAL #2   Title grip strength on right >/= 30 pounds   Time 12   Period Weeks   Status On-going   PT LONG TERM GOAL #3   Title ability to put her bra on due to right shoulder AROM within functional limits   Time 12   Period Weeks   Status On-going   PT LONG TERM GOAL #4   Title return to driving due to right shoulder strength 4/5   Time 12   Period Weeks   Status On-going   PT LONG TERM GOAL #5   Title able to cook due to right elbow AROM within functional limits   Time 12   Period Weeks   Status On-going                Plan - 04/28/15 4166    Clinical Impression Statement Pt with continued limited Rt shoulder and elbow AROM and limited strength in the Rt UE.  Pt demonstrates substitution due to limited AROM and weakness with AROM of the Rt UE.  Pt able to tolerate all exercise in the clinic without pain.  Pt will continue to benefit from skilled PT for strength, AROM and use of Rt UE with less substitution.     Pt will benefit from skilled therapeutic intervention in order to improve on the following deficits Decreased range of motion;Increased fascial restricitons;Impaired UE functional use;Increased muscle spasms;Decreased endurance;Decreased activity tolerance;Pain;Impaired flexibility;Hypomobility;Decreased scar mobility;Decreased mobility;Decreased strength;Increased edema   Rehab Potential Good   PT Frequency 3x / week   PT Duration 12 weeks   PT Treatment/Interventions ADLs/Self Care Home Management;Cryotherapy;Electrical Stimulation;Ultrasound;Moist Heat;Functional mobility training;Therapeutic activities;Therapeutic exercise;Neuromuscular re-education;Manual techniques;Patient/family education;Scar mobilization;Passive range of motion;Taping   PT Next Visit Plan Rt shoulder AROM, supination strength and ROM   Consulted and Agree with Plan of Care Patient        Problem List Patient Active Problem List   Diagnosis Date Noted  . Right supracondylar humerus fracture; right radial head/neck fracture 12/22/2014    Hansika Leaming, PT 04/28/2015, 9:28 AM  [facility] [address], [place], [place], [address] Phone: [phone]   Fax:  [phone]

## 2015-04-30 ENCOUNTER — Ambulatory Visit: Payer: Medicare Other | Admitting: Physical Therapy

## 2015-04-30 ENCOUNTER — Encounter: Payer: Self-pay | Admitting: Physical Therapy

## 2015-04-30 DIAGNOSIS — M25511 Pain in right shoulder: Secondary | ICD-10-CM

## 2015-04-30 DIAGNOSIS — M25621 Stiffness of right elbow, not elsewhere classified: Secondary | ICD-10-CM | POA: Diagnosis not present

## 2015-04-30 DIAGNOSIS — M25611 Stiffness of right shoulder, not elsewhere classified: Secondary | ICD-10-CM

## 2015-04-30 DIAGNOSIS — M25521 Pain in right elbow: Secondary | ICD-10-CM

## 2015-04-30 DIAGNOSIS — M25641 Stiffness of right hand, not elsewhere classified: Secondary | ICD-10-CM

## 2015-04-30 DIAGNOSIS — M25631 Stiffness of right wrist, not elsewhere classified: Secondary | ICD-10-CM

## 2015-04-30 NOTE — Therapy (Signed)
[facility] [address] [place], [place], [address] Phone: [phone]   Fax:  [phone]  Physical Therapy Treatment  Patient Details  Name: Katherine Barnett MRN: [id number] Date of Birth: 1941-06-11 Referring Provider:  Pete Pelt, PA-C  Encounter Date: 04/30/2015      PT End of Session - 04/30/15 0926    Visit Number 25   Number of Visits 30   Date for PT Re-Evaluation 05/13/15   PT Start Time 0844   PT Stop Time 0937   PT Time Calculation (min) 53 min   Activity Tolerance Patient tolerated treatment well   Behavior During Therapy [facility] for tasks assessed/performed      Past Medical History  Diagnosis Date  . Hypertension   . Thyroid disease     pt denies this (12/18/14)  . Diabetes mellitus without complication ([place])     type 2 - diet controlled  . Complication of anesthesia     vomited after colonoscopy    Past Surgical History  Procedure Laterality Date  . Eye surgery Bilateral     cataract surgery with lens implant  . Colonoscopy    . Orif humerus fracture Right 12/22/2014    Procedure: OPEN REDUCTION INTERNAL FIXATION (ORIF) RIGHT DISTAL HUMERUS FRACTURE;  Surgeon: Mcarthur Rossetti, MD;  Location: [place];  Service: Orthopedics;  Laterality: Right;    There were no vitals filed for this visit.  Visit Diagnosis:  Elbow stiffness, right  Shoulder stiffness, right  Right shoulder pain  Wrist stiffness, right  Right elbow pain  Stiffness of right hand joint      Subjective Assessment - 04/30/15 0851    Subjective Pt reports increased swelling in right hand and pain in Right shoulder after last PT session may be due to using different tool on velcro board.   Currently in Pain? Yes   Pain Score 3    Pain Location Shoulder   Pain Orientation Right   Pain Descriptors / Indicators Spasm  sharp at times   Pain Type Surgical pain   Pain Onset More than a month ago   Pain  Frequency Intermittent   Aggravating Factors  different activities, increased resistance on velcro board   Multiple Pain Sites No                         [facility] Adult PT Treatment/Exercise - 04/30/15 0001    Exercises   Exercises Wrist   Elbow Exercises   Elbow Flexion AROM;20 reps;Standing  standing agains wall to limit compensatory movement   Shoulder Exercises: Supine   Other Supine Exercises --   Shoulder Exercises: Standing   Other Standing Exercises wall ladder 4x up to peg #6   then tearful due to unable to control the movement   Other Standing Exercises shoulder shrugs x 20   Shoulder Exercises: Pulleys   Flexion 3 minutes   ABduction 3 minutes   Wrist Exercises   Forearm Supination AROM;Both;20 reps;Standing   Forearm Pronation AROM   Wrist Flexion AROM;Both;20 reps;Standing   Wrist Extension AROM;20 reps;Standing   Moist Heat Therapy   Number Minutes Moist Heat 10 Minutes   Moist Heat Location Elbow   Manual Therapy   Manual Therapy Passive ROM   Edema Management Retrograde massage to hand and forarm,   Soft tissue mobilization to right forearm and surrounding shoulder muscles   Passive ROM rt elbow and shoulder  PT Short Term Goals - 04/20/15 1700    PT SHORT TERM GOAL #1   Title independent with initital HEP for shoulder flexion and elbow ROM   Time 4   Period Weeks   Status Achieved   PT SHORT TERM GOAL #2   Title pain in right shoulder and elbow decreased >/= 25%   Time 4   Period Weeks   Status Achieved   PT SHORT TERM GOAL #3   Title right shoulder flexion PROM >/= 115 degrees   Time 4   Period Weeks   Status Achieved   PT SHORT TERM GOAL #4   Title right elbow extension >/= -5 degrees   Time 4   Period Weeks   Status On-going   PT SHORT TERM GOAL #5   Title right elbow flexion >/= 110 degrees PROM   Time 4   Period Weeks   Status Achieved           PT Long Term Goals - 04/21/15 1334    PT  LONG TERM GOAL #1   Title independent with HEP and understand how to progress herself   Time 12   Period Weeks   Status On-going   PT LONG TERM GOAL #2   Title grip strength on right >/= 30 pounds   Time 12   Period Weeks   Status On-going   PT LONG TERM GOAL #3   Title ability to put her bra on due to right shoulder AROM within functional limits   Time 12   Period Weeks   Status On-going   PT LONG TERM GOAL #4   Title return to driving due to right shoulder strength 4/5   Time 12   Period Weeks   Status On-going   PT LONG TERM GOAL #5   Title able to cook due to right elbow AROM within functional limits   Time 12   Period Weeks   Status On-going               Plan - 04/30/15 0926    Clinical Impression Statement Pt wilh flare up Rt  UE, decreased ROM and increase of pain in shoulder and elbow/forearm. Pt will continue to benefit from skilled PT to improve ROM and strength.   Rehab Potential Good   PT Frequency 3x / week   PT Duration 12 weeks   PT Treatment/Interventions ADLs/Self Care Home Management;Cryotherapy;Electrical Stimulation;Ultrasound;Moist Heat;Functional mobility training;Therapeutic activities;Therapeutic exercise;Neuromuscular re-education;Manual techniques;Patient/family education;Scar mobilization;Passive range of motion;Taping   PT Next Visit Plan Gentle Rt shoulder AROM, supination and Manual therapy   PT Home Exercise Plan progress as needed   Consulted and Agree with Plan of Care Patient        Problem List Patient Active Problem List   Diagnosis Date Noted  . Right supracondylar humerus fracture; right radial head/neck fracture 12/22/2014    NAUMANN-HOUEGNIFIO,Ramel Tobon PTA 04/30/2015, 12:03 PM  [place] [facility] [address] [place], [place], [address] Phone: [phone]   Fax:  [phone]

## 2015-05-04 ENCOUNTER — Ambulatory Visit: Payer: Medicare Other | Admitting: Physical Therapy

## 2015-05-04 ENCOUNTER — Encounter: Payer: Self-pay | Admitting: Physical Therapy

## 2015-05-04 DIAGNOSIS — M25621 Stiffness of right elbow, not elsewhere classified: Secondary | ICD-10-CM

## 2015-05-04 DIAGNOSIS — M25611 Stiffness of right shoulder, not elsewhere classified: Secondary | ICD-10-CM

## 2015-05-04 DIAGNOSIS — M25631 Stiffness of right wrist, not elsewhere classified: Secondary | ICD-10-CM

## 2015-05-04 DIAGNOSIS — M25521 Pain in right elbow: Secondary | ICD-10-CM

## 2015-05-04 DIAGNOSIS — M25511 Pain in right shoulder: Secondary | ICD-10-CM

## 2015-05-04 DIAGNOSIS — M25641 Stiffness of right hand, not elsewhere classified: Secondary | ICD-10-CM

## 2015-05-04 NOTE — Therapy (Addendum)
[facility] [address] [place], [place], [address] Phone: [phone]   Fax:  [phone]  Physical Therapy Treatment  Patient Details  Name: Katherine Barnett MRN: [id number] Date of Birth: 06/18/1941 Referring Provider:  Pete Pelt, PA-C  Encounter Date: 05/04/2015      PT End of Session - 05/04/15 0901    Visit Number 26   Number of Visits 30   Date for PT Re-Evaluation 05/13/15   PT Start Time 0842   PT Stop Time 0940   PT Time Calculation (min) 58 min   Activity Tolerance Patient tolerated treatment well   Behavior During Therapy [facility] for tasks assessed/performed      Past Medical History  Diagnosis Date  . Hypertension   . Thyroid disease     pt denies this (12/18/14)  . Diabetes mellitus without complication ([place])     type 2 - diet controlled  . Complication of anesthesia     vomited after colonoscopy    Past Surgical History  Procedure Laterality Date  . Eye surgery Bilateral     cataract surgery with lens implant  . Colonoscopy    . Orif humerus fracture Right 12/22/2014    Procedure: OPEN REDUCTION INTERNAL FIXATION (ORIF) RIGHT DISTAL HUMERUS FRACTURE;  Surgeon: Mcarthur Rossetti, MD;  Location: [place];  Service: Orthopedics;  Laterality: Right;    There were no vitals filed for this visit.  Visit Diagnosis:  Elbow stiffness, right  Shoulder stiffness, right  Right shoulder pain  Wrist stiffness, right  Right elbow pain  Stiffness of right hand joint      Subjective Assessment - 05/04/15 0850    Subjective Pt is feeling better today, swelling is decreased in right forarm and hand, no pain today with rest but with activities up to 5/10.     Currently in Pain? No/denies  only with activities up to 5/10, rest 0/10                         [facility] Adult PT Treatment/Exercise - 05/04/15 0001    Exercises   Exercises Wrist   Elbow Exercises   Elbow Flexion  AROM;20 reps;Standing  leaning agains wall   Shoulder Exercises: Supine   Other Supine Exercises Velcro board (narrow strip) for pro/sup & flex/ext wrist x 4 each   with small spoon   Shoulder Exercises: Standing   Other Standing Exercises wall ladder 4x5 up to peg #6    Other Standing Exercises shoulder shrugs x 20   Shoulder Exercises: Pulleys   Flexion 3 minutes;1 minute   ABduction 3 minutes;1 minute   Wrist Exercises   Forearm Supination AROM;Both;20 reps;Standing  leaning against wall   Forearm Pronation AROM   Moist Heat Therapy   Number Minutes Moist Heat 10 Minutes   Moist Heat Location Elbow   Manual Therapy   Manual Therapy Passive ROM  Rt scapula, Rt shoulder and mobilisation of radius head   Edema Management --   Soft tissue mobilization to right scapula surrounding shoulder muscles   Passive ROM scapula, and shoulder joint to incr ROM shoulder                  PT Short Term Goals - 04/20/15 1700    PT SHORT TERM GOAL #1   Title independent with initital HEP for shoulder flexion and elbow ROM   Time 4   Period Weeks   Status  Achieved   PT SHORT TERM GOAL #2   Title pain in right shoulder and elbow decreased >/= 25%   Time 4   Period Weeks   Status Achieved   PT SHORT TERM GOAL #3   Title right shoulder flexion PROM >/= 115 degrees   Time 4   Period Weeks   Status Achieved   PT SHORT TERM GOAL #4   Title right elbow extension >/= -5 degrees   Time 4   Period Weeks   Status On-going   PT SHORT TERM GOAL #5   Title right elbow flexion >/= 110 degrees PROM   Time 4   Period Weeks   Status Achieved           PT Long Term Goals - 05/04/15 0258    PT LONG TERM GOAL #1   Title independent with HEP and understand how to progress herself   Time 12   Period Weeks   Status On-going   PT LONG TERM GOAL #2   Title grip strength on right >/= 30 pounds   Time 12   Period Weeks   Status On-going   PT LONG TERM GOAL #3   Title ability to put  her bra on due to right shoulder AROM within functional limits   Time 12   Period Weeks   Status On-going   PT LONG TERM GOAL #4   Title return to driving due to right shoulder strength 4/5   Time 12   Period Weeks   Status On-going   PT LONG TERM GOAL #5   Title able to cook due to right elbow AROM within functional limits   Time 12   Period Weeks   Status On-going               Plan - 05/04/15 0903    Clinical Impression Statement Pt with good activity tolerance, able to perform wallladder and velcro board again after flare up last week. Pt will continue to benefit from skilled PT to improve ROM and strength.   Pt will benefit from skilled therapeutic intervention in order to improve on the following deficits Decreased range of motion;Increased fascial restricitons;Impaired UE functional use;Increased muscle spasms;Decreased endurance;Decreased activity tolerance;Pain;Impaired flexibility;Hypomobility;Decreased scar mobility;Decreased mobility;Decreased strength;Increased edema   Rehab Potential Good   PT Frequency 3x / week   PT Duration 12 weeks   PT Treatment/Interventions ADLs/Self Care Home Management;Cryotherapy;Electrical Stimulation;Ultrasound;Moist Heat;Functional mobility training;Therapeutic activities;Therapeutic exercise;Neuromuscular re-education;Manual techniques;Patient/family education;Scar mobilization;Passive range of motion;Taping   PT Next Visit Plan Continue to improve ROM shoulder, elbow focus on supination, manual therapy   PT Home Exercise Plan progress as needed   Consulted and Agree with Plan of Care Patient        Problem List Patient Active Problem List   Diagnosis Date Noted  . Right supracondylar humerus fracture; right radial head/neck fracture 12/22/2014    NAUMANN-HOUEGNIFIO,Zohra Clavel PTA 05/04/2015, 9:32 AM  [facility] [address] [place], [place], [address] Phone:  [phone]   Fax:  [phone]

## 2015-05-05 ENCOUNTER — Encounter: Payer: Self-pay | Admitting: Physical Therapy

## 2015-05-05 ENCOUNTER — Ambulatory Visit: Payer: Medicare Other | Admitting: Physical Therapy

## 2015-05-05 DIAGNOSIS — M25521 Pain in right elbow: Secondary | ICD-10-CM

## 2015-05-05 DIAGNOSIS — M25611 Stiffness of right shoulder, not elsewhere classified: Secondary | ICD-10-CM

## 2015-05-05 DIAGNOSIS — M25631 Stiffness of right wrist, not elsewhere classified: Secondary | ICD-10-CM

## 2015-05-05 DIAGNOSIS — M25641 Stiffness of right hand, not elsewhere classified: Secondary | ICD-10-CM

## 2015-05-05 DIAGNOSIS — M25621 Stiffness of right elbow, not elsewhere classified: Secondary | ICD-10-CM | POA: Diagnosis not present

## 2015-05-05 DIAGNOSIS — M25511 Pain in right shoulder: Secondary | ICD-10-CM

## 2015-05-05 NOTE — Therapy (Signed)
[facility] [address], [place], [place], [address] Phone: [phone]   Fax:  [phone]  Physical Therapy Treatment  Patient Details  Name: Katherine Barnett MRN: [id number] Date of Birth: [birth year] Referring Provider:  Pete Pelt, PA-C  Encounter Date: 05/05/2015      PT End of Session - 05/04/15 0901    Visit Number 26   Number of Visits 30  KX modifier   Date for PT Re-Evaluation 05/13/15   PT Start Time 0842   PT Stop Time 0940   PT Time Calculation (min) 58 min   Activity Tolerance Patient tolerated treatment well   Behavior During Therapy [facility] for tasks assessed/performed      Past Medical History  Diagnosis Date  . Hypertension   . Thyroid disease     pt denies this (12/18/14)  . Diabetes mellitus without complication ([place])     type 2 - diet controlled  . Complication of anesthesia     vomited after colonoscopy    Past Surgical History  Procedure Laterality Date  . Eye surgery Bilateral     cataract surgery with lens implant  . Colonoscopy    . Orif humerus fracture Right 12/22/2014    Procedure: OPEN REDUCTION INTERNAL FIXATION (ORIF) RIGHT DISTAL HUMERUS FRACTURE;  Surgeon: Mcarthur Rossetti, MD;  Location: [place];  Service: Orthopedics;  Laterality: Right;    There were no vitals filed for this visit.  Visit Diagnosis:  Elbow stiffness, right  Shoulder stiffness, right  Right shoulder pain  Wrist stiffness, right  Right elbow pain  Stiffness of right hand joint      Subjective Assessment - 05/05/15 0919    Subjective Pt continues to feel better, no complain of pain today.    Currently in Pain? No/denies                         [facility] Adult PT Treatment/Exercise - 05/05/15 0001    Exercises   Exercises Wrist   Elbow Exercises   Elbow Flexion AROM;20 reps;Standing  leaning agains wall   Shoulder Exercises: Supine   Other Supine Exercises Velcro  board (narrow strip) for pro/sup & flex/ext wrist x 4 each   with small spoon   Shoulder Exercises: Standing   Other Standing Exercises wall ladder 4x5 up to peg #6    Other Standing Exercises shoulder shrugs x 20   Shoulder Exercises: Pulleys   Flexion 3 minutes;1 minute   ABduction 3 minutes;1 minute   Wrist Exercises   Forearm Supination AROM;Both;20 reps;Standing   Forearm Pronation AROM   Moist Heat Therapy   Number Minutes Moist Heat 10 Minutes   Moist Heat Location Elbow   Manual Therapy   Manual Therapy Passive ROM  mobilisation of radius prox and distal to incr supination   Soft tissue mobilization to right scapula surrounding shoulder muscles   Passive ROM scapula, and shoulder joint to incr ROM shoulder                  PT Short Term Goals - 04/20/15 1700    PT SHORT TERM GOAL #1   Title independent with initital HEP for shoulder flexion and elbow ROM   Time 4   Period Weeks   Status Achieved   PT SHORT TERM GOAL #2   Title pain in right shoulder and elbow decreased >/= 25%   Time 4   Period Weeks  Status Achieved   PT SHORT TERM GOAL #3   Title right shoulder flexion PROM >/= 115 degrees   Time 4   Period Weeks   Status Achieved   PT SHORT TERM GOAL #4   Title right elbow extension >/= -5 degrees   Time 4   Period Weeks   Status On-going   PT SHORT TERM GOAL #5   Title right elbow flexion >/= 110 degrees PROM   Time 4   Period Weeks   Status Achieved           PT Long Term Goals - 05/04/15 4982    PT LONG TERM GOAL #1   Title independent with HEP and understand how to progress herself   Time 12   Period Weeks   Status On-going   PT LONG TERM GOAL #2   Title grip strength on right >/= 30 pounds   Time 12   Period Weeks   Status On-going   PT LONG TERM GOAL #3   Title ability to put her bra on due to right shoulder AROM within functional limits   Time 12   Period Weeks   Status On-going   PT LONG TERM GOAL #4   Title return  to driving due to right shoulder strength 4/5   Time 12   Period Weeks   Status On-going   PT LONG TERM GOAL #5   Title able to cook due to right elbow AROM within functional limits   Time 12   Period Weeks   Status On-going               Plan - 05/04/15 0903    Clinical Impression Statement Pt with good activity tolerance, able to perform wallladder and velcro board again after flare up last week. Pt will continue to benefit from skilled PT to improve ROM and strength.   Pt will benefit from skilled therapeutic intervention in order to improve on the following deficits Decreased range of motion;Increased fascial restricitons;Impaired UE functional use;Increased muscle spasms;Decreased endurance;Decreased activity tolerance;Pain;Impaired flexibility;Hypomobility;Decreased scar mobility;Decreased mobility;Decreased strength;Increased edema   Rehab Potential Good   PT Frequency 3x / week   PT Duration 12 weeks   PT Treatment/Interventions ADLs/Self Care Home Management;Cryotherapy;Electrical Stimulation;Ultrasound;Moist Heat;Functional mobility training;Therapeutic activities;Therapeutic exercise;Neuromuscular re-education;Manual techniques;Patient/family education;Scar mobilization;Passive range of motion;Taping   PT Next Visit Plan Continue to improve ROM shoulder, elbow focus on supination, manual therapy   PT Home Exercise Plan progress as needed   Consulted and Agree with Plan of Care Patient        Problem List Patient Active Problem List   Diagnosis Date Noted  . Right supracondylar humerus fracture; right radial head/neck fracture 12/22/2014    NAUMANN-HOUEGNIFIO,Jerrelle Michelsen PTA 05/05/2015, 1:49 PM   [facility] [address], [place], [place], [address] Phone: [phone]   Fax:  [phone]

## 2015-05-07 ENCOUNTER — Ambulatory Visit: Payer: Medicare Other | Admitting: Physical Therapy

## 2015-05-07 ENCOUNTER — Encounter: Payer: Self-pay | Admitting: Physical Therapy

## 2015-05-07 DIAGNOSIS — M25621 Stiffness of right elbow, not elsewhere classified: Secondary | ICD-10-CM | POA: Diagnosis not present

## 2015-05-07 DIAGNOSIS — M25631 Stiffness of right wrist, not elsewhere classified: Secondary | ICD-10-CM

## 2015-05-07 DIAGNOSIS — M25521 Pain in right elbow: Secondary | ICD-10-CM

## 2015-05-07 DIAGNOSIS — M25511 Pain in right shoulder: Secondary | ICD-10-CM

## 2015-05-07 DIAGNOSIS — M25611 Stiffness of right shoulder, not elsewhere classified: Secondary | ICD-10-CM

## 2015-05-07 DIAGNOSIS — M25641 Stiffness of right hand, not elsewhere classified: Secondary | ICD-10-CM

## 2015-05-07 NOTE — Therapy (Addendum)
[facility] [address] [place], [place], [address] Phone: [phone]   Fax:  [phone]  Physical Therapy Treatment  Patient Details  Name: Katherine Barnett MRN: [id number] Date of Birth: 13-Dec-1940 No Data Recorded  Encounter Date: 05/07/2015    Past Medical History  Diagnosis Date  . Hypertension   . Thyroid disease     pt denies this (12/18/14)  . Diabetes mellitus without complication ([place])     type 2 - diet controlled  . Complication of anesthesia     vomited after colonoscopy    Past Surgical History  Procedure Laterality Date  . Eye surgery Bilateral     cataract surgery with lens implant  . Colonoscopy    . Orif humerus fracture Right 12/22/2014    Procedure: OPEN REDUCTION INTERNAL FIXATION (ORIF) RIGHT DISTAL HUMERUS FRACTURE;  Surgeon: Mcarthur Rossetti, MD;  Location: [place];  Service: Orthopedics;  Laterality: Right;    There were no vitals filed for this visit.  Visit Diagnosis:  Elbow stiffness, right  Shoulder stiffness, right  Right shoulder pain  Wrist stiffness, right  Right elbow pain  Stiffness of right hand joint      Subjective Assessment - 05/07/15 0906    Subjective No complain of pain, reports compliance with HEP   Currently in Pain? No/denies   Multiple Pain Sites No                         [facility] Adult PT Treatment/Exercise - 05/07/15 0001    Elbow Exercises   Elbow Flexion AROM;20 reps;Standing  1# added, leaning agains wall   Forearm Supination AROM;Both;20 reps;Standing  1# leaning against wall   Forearm Pronation AROM  1# leaning agains wall   Shoulder Exercises: Supine   Other Supine Exercises Velcro board (narrow strip) for pro/sup & flex/ext wrist x 4 each   with small spoon   Shoulder Exercises: Standing   Flexion AROM;Weights;20 reps  1# added, flexion in available range, leaning against wall   ABduction AROM;Weights;20 reps   1# added, in available range, leaning against wall   Other Standing Exercises wall ladder 5x5 up to peg #6    Other Standing Exercises shoulder shrugs x 20   Shoulder Exercises: Pulleys   Flexion 3 minutes   ABduction 3 minutes   Moist Heat Therapy   Number Minutes Moist Heat 10 Minutes   Moist Heat Location Elbow   Manual Therapy   Manual Therapy Passive ROM  mobilization of radius to incr supination, traction ulna to    Soft tissue mobilization to right scapula surrounding shoulder muscles   Passive ROM scapula, and shoulder joint to incr ROM shoulder                  PT Short Term Goals - 04/20/15 1700    PT SHORT TERM GOAL #1   Title independent with initital HEP for shoulder flexion and elbow ROM   Time 4   Period Weeks   Status Achieved   PT SHORT TERM GOAL #2   Title pain in right shoulder and elbow decreased >/= 25%   Time 4   Period Weeks   Status Achieved   PT SHORT TERM GOAL #3   Title right shoulder flexion PROM >/= 115 degrees   Time 4   Period Weeks   Status Achieved   PT SHORT TERM GOAL #4   Title right elbow extension >/= -  5 degrees   Time 4   Period Weeks   Status On-going   PT SHORT TERM GOAL #5   Title right elbow flexion >/= 110 degrees PROM   Time 4   Period Weeks   Status Achieved           PT Long Term Goals - 05/04/15 9470    PT LONG TERM GOAL #1   Title independent with HEP and understand how to progress herself   Time 12   Period Weeks   Status On-going   PT LONG TERM GOAL #2   Title grip strength on right >/= 30 pounds   Time 12   Period Weeks   Status On-going   PT LONG TERM GOAL #3   Title ability to put her bra on due to right shoulder AROM within functional limits   Time 12   Period Weeks   Status On-going   PT LONG TERM GOAL #4   Title return to driving due to right shoulder strength 4/5   Time 12   Period Weeks   Status On-going   PT LONG TERM GOAL #5   Title able to cook due to right elbow AROM within  functional limits   Time 12   Period Weeks   Status On-going               Problem List Patient Active Problem List   Diagnosis Date Noted  . Right supracondylar humerus fracture; right radial head/neck fracture 12/22/2014    Katherine Barnett PTA 05/07/2015, 9:38 AM  [place] [facility] [address], [place], [place], [address] Phone: [phone]   Fax:  [phone]  Name: Katherine Barnett MRN: [id number] Date of Birth: May 20, 1941

## 2015-05-10 ENCOUNTER — Encounter: Payer: Self-pay | Admitting: Physical Therapy

## 2015-05-10 ENCOUNTER — Ambulatory Visit: Payer: Medicare Other | Admitting: Physical Therapy

## 2015-05-10 DIAGNOSIS — M25631 Stiffness of right wrist, not elsewhere classified: Secondary | ICD-10-CM

## 2015-05-10 DIAGNOSIS — M25521 Pain in right elbow: Secondary | ICD-10-CM

## 2015-05-10 DIAGNOSIS — M25621 Stiffness of right elbow, not elsewhere classified: Secondary | ICD-10-CM

## 2015-05-10 DIAGNOSIS — M25511 Pain in right shoulder: Secondary | ICD-10-CM

## 2015-05-10 DIAGNOSIS — M25641 Stiffness of right hand, not elsewhere classified: Secondary | ICD-10-CM

## 2015-05-10 DIAGNOSIS — M25611 Stiffness of right shoulder, not elsewhere classified: Secondary | ICD-10-CM

## 2015-05-10 NOTE — Therapy (Signed)
[facility] [address], [place], [place], [address] Phone: [phone]   Fax:  [phone]  Physical Therapy Treatment  Patient Details  Name: Katherine Barnett MRN: [id number] Date of Birth: 1941-04-26 No Data Recorded  Encounter Date: 05/10/2015      PT End of Session - 05/10/15 0903    Visit Number 65  KX modifier   Number of Visits 30   Date for PT Re-Evaluation 05/13/15   PT Start Time 0842   PT Stop Time 0940   PT Time Calculation (min) 58 min   Activity Tolerance Patient tolerated treatment well   Behavior During Therapy [facility] for tasks assessed/performed      Past Medical History  Diagnosis Date  . Hypertension   . Thyroid disease     pt denies this (12/18/14)  . Diabetes mellitus without complication ([place])     type 2 - diet controlled  . Complication of anesthesia     vomited after colonoscopy    Past Surgical History  Procedure Laterality Date  . Eye surgery Bilateral     cataract surgery with lens implant  . Colonoscopy    . Orif humerus fracture Right 12/22/2014    Procedure: OPEN REDUCTION INTERNAL FIXATION (ORIF) RIGHT DISTAL HUMERUS FRACTURE;  Surgeon: Mcarthur Rossetti, MD;  Location: [place];  Service: Orthopedics;  Laterality: Right;    There were no vitals filed for this visit.  Visit Diagnosis:  Elbow stiffness, right  Shoulder stiffness, right  Right shoulder pain  Wrist stiffness, right  Right elbow pain  Stiffness of right hand joint      Subjective Assessment - 05/10/15 0855    Subjective No complain of pain, reports notices improved ROM with functional activities; easier to do dishes able to keep arm for prolonged time in slighly abduction position, able to place hand on railing of staircase without assist of opposite arm   Patient Stated Goals Patient wants to put her bra on, drive, dress, and use right arm for daily tasks   Currently in Pain? No/denies   Multiple Pain Sites No                         [facility] Adult PT Treatment/Exercise - 05/10/15 0001    Exercises   Exercises Wrist   Elbow Exercises   Elbow Flexion AROM;20 reps;Standing  leaning agains wall to prevent compensatory movement, add 1    Shoulder Exercises: Supine   Other Supine Exercises Velcro board (narrow strip) for pro/sup & flex/ext wrist x 4 each   with small spoon   Shoulder Exercises: Standing   Flexion AROM;Weights;20 reps   ABduction AROM;Weights;20 reps   Other Standing Exercises wall ladder 5x5 up to peg #6    Other Standing Exercises shoulder rolls x 20   Shoulder Exercises: Pulleys   Flexion 3 minutes;1 minute   ABduction 3 minutes;1 minute   Wrist Exercises   Forearm Supination AROM;Both;20 reps;Standing;10 reps   Forearm Pronation AROM;20 reps;10 reps  use 1# weight nest time   Moist Heat Therapy   Number Minutes Moist Heat 10 Minutes   Moist Heat Location Elbow   Manual Therapy   Manual Therapy Passive ROM  mobilization of prox radius to improve supination   Soft tissue mobilization to right scapula surrounding shoulder muscles   Passive ROM scapula, and shoulder joint to incr ROM shoulder  PT Short Term Goals - 04/20/15 1700    PT SHORT TERM GOAL #1   Title independent with initital HEP for shoulder flexion and elbow ROM   Time 4   Period Weeks   Status Achieved   PT SHORT TERM GOAL #2   Title pain in right shoulder and elbow decreased >/= 25%   Time 4   Period Weeks   Status Achieved   PT SHORT TERM GOAL #3   Title right shoulder flexion PROM >/= 115 degrees   Time 4   Period Weeks   Status Achieved   PT SHORT TERM GOAL #4   Title right elbow extension >/= -5 degrees   Time 4   Period Weeks   Status On-going   PT SHORT TERM GOAL #5   Title right elbow flexion >/= 110 degrees PROM   Time 4   Period Weeks   Status Achieved           PT Long Term Goals - 05/10/15 1287    PT LONG  TERM GOAL #1   Title independent with HEP and understand how to progress herself   Time 12   Period Weeks   Status On-going   PT LONG TERM GOAL #2   Title grip strength on right >/= 30 pounds   Time 12   Period Weeks   Status On-going   PT LONG TERM GOAL #3   Title ability to put her bra on due to right shoulder AROM within functional limits   Time 12   Period Weeks   Status On-going   PT LONG TERM GOAL #4   Title return to driving due to right shoulder strength 4/5   Time 12   Period Weeks   Status On-going   PT LONG TERM GOAL #5   Title able to cook due to right elbow AROM within functional limits   Time 12   Period Weeks   Status On-going               Plan - 05/10/15 0917    Clinical Impression Statement All over quality of movement with activities in available range is improving, pt still very limited with Rt supination and all planes in Rt shoulder, decreased strength and endurance    Pt will benefit from skilled therapeutic intervention in order to improve on the following deficits Decreased range of motion;Increased fascial restricitons;Impaired UE functional use;Increased muscle spasms;Decreased endurance;Decreased activity tolerance;Pain;Impaired flexibility;Hypomobility;Decreased scar mobility;Decreased mobility;Decreased strength;Increased edema   Rehab Potential Good   PT Frequency 3x / week   PT Duration 12 weeks   PT Treatment/Interventions ADLs/Self Care Home Management;Cryotherapy;Electrical Stimulation;Ultrasound;Moist Heat;Functional mobility training;Therapeutic activities;Therapeutic exercise;Neuromuscular re-education;Manual techniques;Patient/family education;Scar mobilization;Passive range of motion;Taping   PT Next Visit Plan Continue to improve ROM shoulder, supination, manual therapy   PT Home Exercise Plan progress as needed   Consulted and Agree with Plan of Care Patient        Problem List Patient Active Problem List   Diagnosis Date  Noted  . Right supracondylar humerus fracture; right radial head/neck fracture 12/22/2014    NAUMANN-HOUEGNIFIO,Donelda Mailhot PTA 05/10/2015, 9:29 AM  [facility] [address], [place], [place], [address] Phone: [phone]   Fax:  [phone]  Name: Jazzmyne Rasnick MRN: [id number] Date of Birth: January 15, 1941

## 2015-05-12 ENCOUNTER — Ambulatory Visit: Payer: Medicare Other

## 2015-05-12 DIAGNOSIS — M25511 Pain in right shoulder: Secondary | ICD-10-CM

## 2015-05-12 DIAGNOSIS — M25621 Stiffness of right elbow, not elsewhere classified: Secondary | ICD-10-CM

## 2015-05-12 DIAGNOSIS — M25611 Stiffness of right shoulder, not elsewhere classified: Secondary | ICD-10-CM

## 2015-05-12 DIAGNOSIS — M25631 Stiffness of right wrist, not elsewhere classified: Secondary | ICD-10-CM

## 2015-05-12 DIAGNOSIS — M25521 Pain in right elbow: Secondary | ICD-10-CM

## 2015-05-12 DIAGNOSIS — M25641 Stiffness of right hand, not elsewhere classified: Secondary | ICD-10-CM

## 2015-05-12 NOTE — Therapy (Signed)
[facility] [address], [place], [place], [address] Phone: [phone]   Fax:  [phone]  Physical Therapy Treatment  Patient Details  Name: Katherine Barnett MRN: [id number] Date of Birth: 1940-08-02 Referring Provider: Jean Rosenthal, MD  Encounter Date: 05/12/2015      PT End of Session - 05/12/15 0928    Visit Number 29   Number of Visits 68  KX needed, G codes due 39   Date for PT Re-Evaluation 06/25/15   PT Start Time 0843   PT Stop Time 0938   PT Time Calculation (min) 55 min   Activity Tolerance Patient tolerated treatment well   Behavior During Therapy [facility] for tasks assessed/performed      Past Medical History  Diagnosis Date  . Hypertension   . Thyroid disease     pt denies this (12/18/14)  . Diabetes mellitus without complication ([place])     type 2 - diet controlled  . Complication of anesthesia     vomited after colonoscopy    Past Surgical History  Procedure Laterality Date  . Eye surgery Bilateral     cataract surgery with lens implant  . Colonoscopy    . Orif humerus fracture Right 12/22/2014    Procedure: OPEN REDUCTION INTERNAL FIXATION (ORIF) RIGHT DISTAL HUMERUS FRACTURE;  Surgeon: Mcarthur Rossetti, MD;  Location: [place];  Service: Orthopedics;  Laterality: Right;    There were no vitals filed for this visit.  Visit Diagnosis:  Elbow stiffness, right - Plan: PT plan of care cert/re-cert  Shoulder stiffness, right - Plan: PT plan of care cert/re-cert  Right shoulder pain - Plan: PT plan of care cert/re-cert  Wrist stiffness, right - Plan: PT plan of care cert/re-cert  Right elbow pain - Plan: PT plan of care cert/re-cert  Stiffness of right hand joint - Plan: PT plan of care cert/re-cert          [facility] PT Assessment - 05/12/15 0001    Assessment   Medical Diagnosis s/p ORIF right Supra Condylar Humerus Fracture 6/1/201; Right proimal humerus  fracture   Referring Provider Jean Rosenthal, MD   Onset Date/Surgical Date 12/22/14   Precautions   Precautions Shoulder   Precaution Comments osteoporosis   Prior Function   Level of Independence Independent with basic ADLs   Observation/Other Assessments   Focus on Therapeutic Outcomes (FOTO)  62% limitation   ROM / Strength   AROM / PROM / Strength AROM   AROM   AROM Assessment Site Shoulder   Right/Left Shoulder Right   Right Shoulder Extension 45 Degrees   Right Shoulder Flexion 85 Degrees   Right Shoulder ABduction 53 Degrees   Right/Left Elbow Right   Right Elbow Flexion 100   Right Elbow Extension -14   Right/Left Forearm Right   Right Forearm Pronation 90 Degrees   Right Forearm Supination 16 Degrees   PROM   PROM Assessment Site Shoulder   Right/Left Shoulder Right   Right Shoulder Flexion 114 Degrees   Strength   Overall Strength Deficits   Strength Assessment Site Hand;Shoulder   Right/Left Shoulder Right   Right Shoulder Flexion 3/5   Right Shoulder Extension --  Rt elbow flexion 4/5   Right Shoulder Internal Rotation 4/5   Right Shoulder External Rotation 3-/5   Right/Left hand Right   Right Hand Grip (lbs) 4  [facility] Adult PT Treatment/Exercise - 05/12/15 0001    Elbow Exercises   Forearm Supination AROM;Both;20 reps;Standing;10 reps   Forearm Pronation AROM;20 reps;10 reps  use 1# weight nest time   Shoulder Exercises: Supine   Other Supine Exercises Velcro board (narrow strip) for pro/sup & flex/ext wrist x 4 each   with small spoon   Shoulder Exercises: Standing   Flexion AROM;Weights;20 reps   Shoulder Flexion Weight (lbs) 1   ABduction AROM;Weights;20 reps   Shoulder ABduction Weight (lbs) 1   Other Standing Exercises wall ladder 5x5 up to peg #6    Other Standing Exercises shoulder rolls x 20   Shoulder Exercises: Pulleys   Flexion 3 minutes;1 minute   ABduction 3 minutes;1 minute   Moist Heat Therapy   Number  Minutes Moist Heat 10 Minutes   Moist Heat Location Elbow   Manual Therapy   Manual Therapy Passive ROM  mobilization of prox radius to improve supination                  PT Short Term Goals - 04/20/15 1700    PT SHORT TERM GOAL #1   Title independent with initital HEP for shoulder flexion and elbow ROM   Time 4   Period Weeks   Status Achieved   PT SHORT TERM GOAL #2   Title pain in right shoulder and elbow decreased >/= 25%   Time 4   Period Weeks   Status Achieved   PT SHORT TERM GOAL #3   Title right shoulder flexion PROM >/= 115 degrees   Time 4   Period Weeks   Status Achieved   PT SHORT TERM GOAL #4   Title right elbow extension >/= -5 degrees   Time 4   Period Weeks   Status On-going   PT SHORT TERM GOAL #5   Title right elbow flexion >/= 110 degrees PROM   Time 4   Period Weeks   Status Achieved           PT Long Term Goals - 05/12/15 7829    PT LONG TERM GOAL #1   Title independent with HEP and understand how to progress herself   Time 6   Period Weeks   Status On-going   PT LONG TERM GOAL #2   Title grip strength on Rt >or = to 20#   Time 6   Period Weeks   Status New   PT LONG TERM GOAL #3   Title demonstrate Rt shoulder AROM IR to L3 to improve self-care with Rt UE   Time 6   Period Weeks   Status New   PT LONG TERM GOAL #4   Title return to driving due to right shoulder strength 4/5   Time 6   Period Weeks   Status On-going  not driving and see MMT for strength   PT LONG TERM GOAL #5   Title able to cook due to right elbow AROM within functional limits   Time 6   Period Weeks   Status On-going  use of Rt UE 25%                Plan - 05/12/15 0907    Clinical Impression Statement Pt reports 55% use of Rt UE with home tasks and 25% use with cooking.  Pt with 3# grip strength on the Rt due to continued and significant edema in the Rt hand.  Pt now able to raise arm independently against gravity  to >80 degrees for  improved reaching.  FOTO score is 64% limitation.  Pt with continued Rt UE weakness and limited AROM (see measures) due to significant injury and fixation.  Pt is making slow but steady gains toward goals and more functional use of the Rt UE.  Pt will continue to benefit from skilled PT for strenght and ROM gains.     Pt will benefit from skilled therapeutic intervention in order to improve on the following deficits Decreased range of motion;Increased fascial restricitons;Impaired UE functional use;Increased muscle spasms;Decreased endurance;Decreased activity tolerance;Pain;Impaired flexibility;Hypomobility;Decreased scar mobility;Decreased mobility;Decreased strength;Increased edema   Rehab Potential Good   PT Frequency 2x / week   PT Duration 6 weeks   PT Treatment/Interventions ADLs/Self Care Home Management;Cryotherapy;Electrical Stimulation;Ultrasound;Moist Heat;Functional mobility training;Therapeutic activities;Therapeutic exercise;Neuromuscular re-education;Manual techniques;Patient/family education;Scar mobilization;Passive range of motion;Taping   PT Next Visit Plan Continue to improve ROM shoulder, supination, manual therapy.  2x for 6 weeks   Consulted and Agree with Plan of Care Patient          G-Codes - Jun 06, 2015 0901    Functional Assessment Tool Used FOTO: 64% limitation   Functional Limitation Other PT primary   Other PT Primary Current Status (E3212) At least 60 percent but less than 80 percent impaired, limited or restricted   Other PT Primary Goal Status (Y4825) At least 20 percent but less than 40 percent impaired, limited or restricted      Problem List Patient Active Problem List   Diagnosis Date Noted  . Right supracondylar humerus fracture; right radial head/neck fracture 12/22/2014    Delberta Folts, PT 2015/06/06, 9:30 AM  [facility] [address] [place], [place], [address] Phone: [phone]    Fax:  [phone]  Name: Katherine Barnett MRN: [id number] Date of Birth: 06/14/1941

## 2015-05-13 ENCOUNTER — Encounter: Payer: Self-pay | Admitting: Physical Therapy

## 2015-05-13 ENCOUNTER — Ambulatory Visit: Payer: Medicare Other | Admitting: Physical Therapy

## 2015-05-13 DIAGNOSIS — M25511 Pain in right shoulder: Secondary | ICD-10-CM

## 2015-05-13 DIAGNOSIS — M25521 Pain in right elbow: Secondary | ICD-10-CM

## 2015-05-13 DIAGNOSIS — M25631 Stiffness of right wrist, not elsewhere classified: Secondary | ICD-10-CM

## 2015-05-13 DIAGNOSIS — M25611 Stiffness of right shoulder, not elsewhere classified: Secondary | ICD-10-CM

## 2015-05-13 DIAGNOSIS — M25641 Stiffness of right hand, not elsewhere classified: Secondary | ICD-10-CM

## 2015-05-13 DIAGNOSIS — M25621 Stiffness of right elbow, not elsewhere classified: Secondary | ICD-10-CM

## 2015-05-13 NOTE — Therapy (Signed)
[facility] [address] [place], [place], [address] Phone: [phone]   Fax:  [phone]  Physical Therapy Treatment  Patient Details  Name: Katherine Barnett MRN: [id number] Date of Birth: [birth year] Referring Provider: Jean Rosenthal, MD  Encounter Date: 05/13/2015      PT End of Session - 05/13/15 0911    Visit Number 30   Number of Visits 39   Date for PT Re-Evaluation 06/25/15   PT Start Time 0843   PT Stop Time 0940   PT Time Calculation (min) 57 min   Activity Tolerance Patient tolerated treatment well   Behavior During Therapy [facility] for tasks assessed/performed      Past Medical History  Diagnosis Date  . Hypertension   . Thyroid disease     pt denies this (12/18/14)  . Diabetes mellitus without complication ([place])     type 2 - diet controlled  . Complication of anesthesia     vomited after colonoscopy    Past Surgical History  Procedure Laterality Date  . Eye surgery Bilateral     cataract surgery with lens implant  . Colonoscopy    . Orif humerus fracture Right 12/22/2014    Procedure: OPEN REDUCTION INTERNAL FIXATION (ORIF) RIGHT DISTAL HUMERUS FRACTURE;  Surgeon: Mcarthur Rossetti, MD;  Location: [place];  Service: Orthopedics;  Laterality: Right;    There were no vitals filed for this visit.  Visit Diagnosis:  Elbow stiffness, right  Shoulder stiffness, right  Right shoulder pain  Wrist stiffness, right  Right elbow pain  Stiffness of right hand joint      Subjective Assessment - 05/13/15 0903    Subjective No complains of pain, able to use the tongues to turn the chicken in the pan, a little cutting with the knife is possible now.  At end of seesion pt felt sick on her stomach and had to skip manual therapy and heat for today.   Pertinent History Open reduction internal fixation (ORIF)  Rt distal humerus fracture on 12/22/2014   Currently in Pain? No/denies                          [facility] Adult PT Treatment/Exercise - 05/13/15 0001    Exercises   Exercises Wrist   Elbow Exercises   Elbow Flexion AROM;20 reps;Standing  leaning against wall to prevent compensatory movement   Shoulder Exercises: Supine   Other Supine Exercises Velcro board (narrow strip) for pro/sup & flex/ext wrist x 4 each   with small spoon, may try to use bigger spoon   Shoulder Exercises: Standing   Other Standing Exercises wall ladder 4x5 up to peg #6, 1x5 up to peg # 7    Other Standing Exercises shoulder rolls x 20   Shoulder Exercises: Pulleys   Flexion 3 minutes;1 minute   ABduction 3 minutes;1 minute   Wrist Exercises   Forearm Supination AROM;Both;20 reps;Standing;10 reps   Forearm Pronation AROM;20 reps;10 reps   Moist Heat Therapy   Number Minutes Moist Heat --   Moist Heat Location --   Manual Therapy   Manual Therapy --                  PT Short Term Goals - 04/20/15 1700    PT SHORT TERM GOAL #1   Title independent with initital HEP for shoulder flexion and elbow ROM   Time 4   Period Weeks   Status  Achieved   PT SHORT TERM GOAL #2   Title pain in right shoulder and elbow decreased >/= 25%   Time 4   Period Weeks   Status Achieved   PT SHORT TERM GOAL #3   Title right shoulder flexion PROM >/= 115 degrees   Time 4   Period Weeks   Status Achieved   PT SHORT TERM GOAL #4   Title right elbow extension >/= -5 degrees   Time 4   Period Weeks   Status On-going   PT SHORT TERM GOAL #5   Title right elbow flexion >/= 110 degrees PROM   Time 4   Period Weeks   Status Achieved           PT Long Term Goals - 20-May-2015 1194    PT LONG TERM GOAL #1   Title independent with HEP and understand how to progress herself   Time 6   Period Weeks   Status On-going   PT LONG TERM GOAL #2   Title grip strength on Rt >or = to 20#   Time 6   Period Weeks   Status New   PT LONG TERM GOAL #3   Title demonstrate Rt  shoulder AROM IR to L3 to improve self-care with Rt UE   Time 6   Period Weeks   Status New   PT LONG TERM GOAL #4   Title return to driving due to right shoulder strength 4/5   Time 6   Period Weeks   Status On-going  not driving and see MMT for strength   PT LONG TERM GOAL #5   Title able to cook due to right elbow AROM within functional limits   Time 6   Period Weeks   Status On-going  use of Rt UE 25%                Plan - 05/13/15 0913    Clinical Impression Statement Pt able to reach peg # 7 on wallladder, and pace with activity on velcro board is improving. Pt still with significant edema in Rt hand. Pt will continue to benefit from skilled PT to continue to improve with ROM and strength   Pt will benefit from skilled therapeutic intervention in order to improve on the following deficits Decreased range of motion;Increased fascial restricitons;Impaired UE functional use;Increased muscle spasms;Decreased endurance;Decreased activity tolerance;Pain;Impaired flexibility;Hypomobility;Decreased scar mobility;Decreased mobility;Decreased strength;Increased edema   Rehab Potential Good   PT Frequency 2x / week   PT Duration 6 weeks   PT Treatment/Interventions ADLs/Self Care Home Management;Cryotherapy;Electrical Stimulation;Ultrasound;Moist Heat;Functional mobility training;Therapeutic activities;Therapeutic exercise;Neuromuscular re-education;Manual techniques;Patient/family education;Scar mobilization;Passive range of motion;Taping   PT Next Visit Plan continue to improve ROM shoulder, focus on supination, manual therapy 2x for 6weeks   Consulted and Agree with Plan of Care Patient          G-Codes - May 20, 2015 0901    Functional Assessment Tool Used FOTO: 64% limitation   Functional Limitation Other PT primary   Other PT Primary Current Status (R7408) At least 60 percent but less than 80 percent impaired, limited or restricted   Other PT Primary Goal Status (X4481) At  least 20 percent but less than 40 percent impaired, limited or restricted      Problem List Patient Active Problem List   Diagnosis Date Noted  . Right supracondylar humerus fracture; right radial head/neck fracture 12/22/2014    NAUMANN-HOUEGNIFIO,Abdinasir Spadafore PTA 05/13/2015, 1:36 PM  [facility] [address]  [address] [place], [place], [address] Phone: [phone]   Fax:  [phone]  Name: Brayden Betters MRN: [id number] Date of Birth: May 02, 1941

## 2015-05-17 ENCOUNTER — Encounter: Payer: Self-pay | Admitting: Physical Therapy

## 2015-05-17 ENCOUNTER — Ambulatory Visit: Payer: Medicare Other | Admitting: Physical Therapy

## 2015-05-17 DIAGNOSIS — M25641 Stiffness of right hand, not elsewhere classified: Secondary | ICD-10-CM

## 2015-05-17 DIAGNOSIS — M25631 Stiffness of right wrist, not elsewhere classified: Secondary | ICD-10-CM

## 2015-05-17 DIAGNOSIS — M25521 Pain in right elbow: Secondary | ICD-10-CM

## 2015-05-17 DIAGNOSIS — M25621 Stiffness of right elbow, not elsewhere classified: Secondary | ICD-10-CM | POA: Diagnosis not present

## 2015-05-17 DIAGNOSIS — M25611 Stiffness of right shoulder, not elsewhere classified: Secondary | ICD-10-CM

## 2015-05-17 DIAGNOSIS — M25511 Pain in right shoulder: Secondary | ICD-10-CM

## 2015-05-17 NOTE — Therapy (Signed)
[facility] [address], [place], [place], [address] Phone: [phone]   Fax:  [phone]  Physical Therapy Treatment  Patient Details  Name: Katherine Barnett MRN: [id number] Date of Birth: [birth year] Referring Provider: Jean Rosenthal, MD  Encounter Date: 05/17/2015      PT End of Session - 05/17/15 0852    Visit Number 31   Number of Visits 71   PT Start Time 0844   PT Stop Time 0941   PT Time Calculation (min) 57 min   Activity Tolerance Patient tolerated treatment well   Behavior During Therapy [facility] for tasks assessed/performed      Past Medical History  Diagnosis Date  . Hypertension   . Thyroid disease     pt denies this (12/18/14)  . Diabetes mellitus without complication ([place])     type 2 - diet controlled  . Complication of anesthesia     vomited after colonoscopy    Past Surgical History  Procedure Laterality Date  . Eye surgery Bilateral     cataract surgery with lens implant  . Colonoscopy    . Orif humerus fracture Right 12/22/2014    Procedure: OPEN REDUCTION INTERNAL FIXATION (ORIF) RIGHT DISTAL HUMERUS FRACTURE;  Surgeon: Mcarthur Rossetti, MD;  Location: [place];  Service: Orthopedics;  Laterality: Right;    There were no vitals filed for this visit.  Visit Diagnosis:  Elbow stiffness, right  Shoulder stiffness, right  Right shoulder pain  Wrist stiffness, right  Right elbow pain  Stiffness of right hand joint      Subjective Assessment - 05/17/15 0850    Subjective Pt reports has now pulley's at home. pt reports able to carry light dishes with right hand and movement seems to improve slowly.    Currently in Pain? No/denies            [facility] PT Assessment - 05/17/15 0001    AROM   AROM Assessment Site Shoulder   Right/Left Shoulder Right   Right Shoulder Extension 45 Degrees   Right Shoulder Flexion 88 Degrees   Right Shoulder ABduction 53 Degrees                      [facility] Adult PT Treatment/Exercise - 05/17/15 0001    Exercises   Exercises Wrist;Neck   Elbow Exercises   Elbow Flexion AROM;20 reps;Standing  standing agains wall to prevent compensatory movement   Neck Exercises: Machines for Strengthening   UBE (Upper Arm Bike) level 0 x 4 (2/2) with initally help to start   Shoulder Exercises: Supine   Other Supine Exercises Velcro board (narrow strip) for pro/sup & flex/ext wrist x 5 each   with small spoon   Shoulder Exercises: Standing   Other Standing Exercises wall ladder 1x5 up to peg #6, 4x5 up to peg # 7    Other Standing Exercises shoulder rolls x 20   Shoulder Exercises: Pulleys   Flexion 3 minutes;1 minute   ABduction 3 minutes;1 minute   Wrist Exercises   Forearm Supination AROM;Both;20 reps;Standing;10 reps  using 1# weight, leaning against wall   Forearm Pronation AROM;20 reps;10 reps  using 1# weight   Moist Heat Therapy   Number Minutes Moist Heat 10 Minutes   Moist Heat Location Elbow   Manual Therapy   Manual Therapy Passive ROM   Soft tissue mobilization to right scapula surrounding shoulder muscles   Passive ROM scapula, and shoulder joint to incr ROM  shoulder                  PT Short Term Goals - 05/17/15 0900    PT SHORT TERM GOAL #1   Title independent with initital HEP for shoulder flexion and elbow ROM   Time 4   Period Weeks   Status Achieved   PT SHORT TERM GOAL #2   Title pain in right shoulder and elbow decreased >/= 25%   Time 4   Period Weeks   Status Achieved   PT SHORT TERM GOAL #3   Title right shoulder flexion PROM >/= 115 degrees   Time 4   Status Achieved   PT SHORT TERM GOAL #4   Title right elbow extension >/= -5 degrees   Time 4   Period Weeks   Status On-going   PT SHORT TERM GOAL #5   Title right elbow flexion >/= 110 degrees PROM   Time 4   Period Weeks   Status Achieved           PT Long Term Goals - 05/12/15 9741    PT LONG TERM  GOAL #1   Title independent with HEP and understand how to progress herself   Time 6   Period Weeks   Status On-going   PT LONG TERM GOAL #2   Title grip strength on Rt >or = to 20#   Time 6   Period Weeks   Status New   PT LONG TERM GOAL #3   Title demonstrate Rt shoulder AROM IR to L3 to improve self-care with Rt UE   Time 6   Period Weeks   Status New   PT LONG TERM GOAL #4   Title return to driving due to right shoulder strength 4/5   Time 6   Period Weeks   Status On-going  not driving and see MMT for strength   PT LONG TERM GOAL #5   Title able to cook due to right elbow AROM within functional limits   Time 6   Period Weeks   Status On-going  use of Rt UE 25%                Plan - 05/17/15 0853    Clinical Impression Statement Pt able to tolerate acitivities in PT clinic very well, continues to reach peg #7 on wall ladder. Pt continues to have decreased ROM in all planes and compensatory movements /shoulder hikiing. Pt will continue to benefit from skilled PT to improve with ROM and strength.    Pt will benefit from skilled therapeutic intervention in order to improve on the following deficits Decreased range of motion;Increased fascial restricitons;Impaired UE functional use;Increased muscle spasms;Decreased endurance;Decreased activity tolerance;Pain;Impaired flexibility;Hypomobility;Decreased scar mobility;Decreased mobility;Decreased strength;Increased edema   Rehab Potential Good   PT Frequency 2x / week   PT Duration 6 weeks   PT Treatment/Interventions ADLs/Self Care Home Management;Cryotherapy;Electrical Stimulation;Ultrasound;Moist Heat;Functional mobility training;Therapeutic activities;Therapeutic exercise;Neuromuscular re-education;Manual techniques;Patient/family education;Scar mobilization;Passive range of motion;Taping   PT Next Visit Plan Continue to improve ROM Rt shoulder with focus on supination, manual therapy   Consulted and Agree with Plan of  Care Patient        Problem List Patient Active Problem List   Diagnosis Date Noted  . Right supracondylar humerus fracture; right radial head/neck fracture 12/22/2014    NAUMANN-HOUEGNIFIO,Caylin Nass PTA 05/17/2015, 9:26 AM  [facility] [address], [place], [place], [address] Phone: [phone]   Fax:  [phone]  Name: Katherine Barnett MRN: [id number] Date of Birth: 1941/05/13

## 2015-05-19 ENCOUNTER — Ambulatory Visit: Payer: Medicare Other | Admitting: Physical Therapy

## 2015-05-19 ENCOUNTER — Encounter: Payer: Self-pay | Admitting: Physical Therapy

## 2015-05-19 DIAGNOSIS — M25511 Pain in right shoulder: Secondary | ICD-10-CM

## 2015-05-19 DIAGNOSIS — M25621 Stiffness of right elbow, not elsewhere classified: Secondary | ICD-10-CM | POA: Diagnosis not present

## 2015-05-19 DIAGNOSIS — M25631 Stiffness of right wrist, not elsewhere classified: Secondary | ICD-10-CM

## 2015-05-19 DIAGNOSIS — M25611 Stiffness of right shoulder, not elsewhere classified: Secondary | ICD-10-CM

## 2015-05-19 DIAGNOSIS — M25521 Pain in right elbow: Secondary | ICD-10-CM

## 2015-05-19 DIAGNOSIS — M25641 Stiffness of right hand, not elsewhere classified: Secondary | ICD-10-CM

## 2015-05-19 NOTE — Therapy (Signed)
[facility] [address], [place], [place], [address] Phone: [phone]   Fax:  [phone]  Physical Therapy Treatment  Patient Details  Name: Katherine Barnett MRN: [id number] Date of Birth: Nov 10, 1940 Referring Provider: Jean Rosenthal, MD  Encounter Date: 05/19/2015      PT End of Session - 05/19/15 0910    Visit Number 32   Number of Visits 39   Date for PT Re-Evaluation 06/25/15   PT Start Time 0846   PT Stop Time 0935   PT Time Calculation (min) 49 min   Activity Tolerance Patient tolerated treatment well   Behavior During Therapy [facility] for tasks assessed/performed      Past Medical History  Diagnosis Date  . Hypertension   . Thyroid disease     pt denies this (12/18/14)  . Diabetes mellitus without complication ([place])     type 2 - diet controlled  . Complication of anesthesia     vomited after colonoscopy    Past Surgical History  Procedure Laterality Date  . Eye surgery Bilateral     cataract surgery with lens implant  . Colonoscopy    . Orif humerus fracture Right 12/22/2014    Procedure: OPEN REDUCTION INTERNAL FIXATION (ORIF) RIGHT DISTAL HUMERUS FRACTURE;  Surgeon: Mcarthur Rossetti, MD;  Location: [place];  Service: Orthopedics;  Laterality: Right;    There were no vitals filed for this visit.  Visit Diagnosis:  Elbow stiffness, right  Shoulder stiffness, right  Right shoulder pain  Wrist stiffness, right  Right elbow pain  Stiffness of right hand joint      Subjective Assessment - 05/19/15 0849    Subjective I was able to write my name this morning with my RT hand/arm.   Currently in Pain? No/denies   Multiple Pain Sites No                         [facility] Adult PT Treatment/Exercise - 05/19/15 0001    Elbow Exercises   Elbow Flexion Strengthening;Right;10 reps;Standing  1#   Shoulder Exercises: Standing   Flexion AROM;Both;10 reps   Other Standing  Exercises wall ladder 4x #7, then #8 last set    Other Standing Exercises shoulder rolls x 20   Shoulder Exercises: Pulleys   Flexion --  5 min   ABduction 3 minutes;1 minute   Wrist Exercises   Other wrist exercises Pronation/supination 1# against wall 10 x each    Other wrist exercises Velcro board pro/sup wrist flex/ext narrow strip small spoon   Moist Heat Therapy   Number Minutes Moist Heat 10 Minutes   Moist Heat Location Shoulder                  PT Short Term Goals - 05/17/15 0900    PT SHORT TERM GOAL #1   Title independent with initital HEP for shoulder flexion and elbow ROM   Time 4   Period Weeks   Status Achieved   PT SHORT TERM GOAL #2   Title pain in right shoulder and elbow decreased >/= 25%   Time 4   Period Weeks   Status Achieved   PT SHORT TERM GOAL #3   Title right shoulder flexion PROM >/= 115 degrees   Time 4   Status Achieved   PT SHORT TERM GOAL #4   Title right elbow extension >/= -5 degrees   Time 4   Period Weeks  Status On-going   PT SHORT TERM GOAL #5   Title right elbow flexion >/= 110 degrees PROM   Time 4   Period Weeks   Status Achieved           PT Long Term Goals - 05/12/15 0902    PT LONG TERM GOAL #1   Title independent with HEP and understand how to progress herself   Time 6   Period Weeks   Status On-going   PT LONG TERM GOAL #2   Title grip strength on Rt >or = to 20#   Time 6   Period Weeks   Status New   PT LONG TERM GOAL #3   Title demonstrate Rt shoulder AROM IR to L3 to improve self-care with Rt UE   Time 6   Period Weeks   Status New   PT LONG TERM GOAL #4   Title return to driving due to right shoulder strength 4/5   Time 6   Period Weeks   Status On-going  not driving and see MMT for strength   PT LONG TERM GOAL #5   Title able to cook due to right elbow AROM within functional limits   Time 6   Period Weeks   Status On-going  use of Rt UE 25%                Plan - 05/19/15  0910    Clinical Impression Statement Was able to reach 8th posiiton on wall ladder today. Compensatory movements due to limitation at [facility] joint. Pt was able to write her name this morning for the first time.    Pt will benefit from skilled therapeutic intervention in order to improve on the following deficits Decreased range of motion;Increased fascial restricitons;Impaired UE functional use;Increased muscle spasms;Decreased endurance;Decreased activity tolerance;Pain;Impaired flexibility;Hypomobility;Decreased scar mobility;Decreased mobility;Decreased strength;Increased edema   Rehab Potential Good   Clinical Impairments Affecting Rehab Potential None   PT Frequency 2x / week   PT Duration 6 weeks   PT Treatment/Interventions ADLs/Self Care Home Management;Cryotherapy;Electrical Stimulation;Ultrasound;Moist Heat;Functional mobility training;Therapeutic activities;Therapeutic exercise;Neuromuscular re-education;Manual techniques;Patient/family education;Scar mobilization;Passive range of motion;Taping   PT Next Visit Plan Continue to improve ROM Rt shoulder with focus on supination, manual therapy   Consulted and Agree with Plan of Care Patient        Problem List Patient Active Problem List   Diagnosis Date Noted  . Right supracondylar humerus fracture; right radial head/neck fracture 12/22/2014    Fuad Forget, PTA 05/19/2015, 9:25 AM  [name] [address] [place], [place], [address] Phone: [phone]   Fax:  [phone]  Name: Katherine Barnett MRN: [id number] Date of Birth: 11-21-1940

## 2015-05-21 ENCOUNTER — Encounter: Payer: Medicare Other | Admitting: Physical Therapy

## 2015-05-24 ENCOUNTER — Ambulatory Visit: Payer: Medicare Other | Admitting: Physical Therapy

## 2015-05-24 ENCOUNTER — Encounter: Payer: Self-pay | Admitting: Physical Therapy

## 2015-05-24 DIAGNOSIS — M25621 Stiffness of right elbow, not elsewhere classified: Secondary | ICD-10-CM | POA: Diagnosis not present

## 2015-05-24 DIAGNOSIS — M25521 Pain in right elbow: Secondary | ICD-10-CM

## 2015-05-24 DIAGNOSIS — M25511 Pain in right shoulder: Secondary | ICD-10-CM

## 2015-05-24 DIAGNOSIS — M25631 Stiffness of right wrist, not elsewhere classified: Secondary | ICD-10-CM

## 2015-05-24 DIAGNOSIS — M25641 Stiffness of right hand, not elsewhere classified: Secondary | ICD-10-CM

## 2015-05-24 DIAGNOSIS — M25611 Stiffness of right shoulder, not elsewhere classified: Secondary | ICD-10-CM

## 2015-05-24 NOTE — Therapy (Signed)
[facility] [address], [place], [place], [address] Phone: [phone]   Fax:  [phone]  Physical Therapy Treatment  Patient Details  Name: Katherine Barnett MRN: [id number] Date of Birth: 08-Dec-1940 Referring Provider: Jean Rosenthal, MD  Encounter Date: 05/24/2015      PT End of Session - 05/24/15 0910    Visit Number 33   Number of Visits 39   Date for PT Re-Evaluation 06/25/15   PT Start Time 0844   PT Stop Time 0940   PT Time Calculation (min) 56 min   Activity Tolerance Patient tolerated treatment well   Behavior During Therapy [facility] for tasks assessed/performed      Past Medical History  Diagnosis Date  . Hypertension   . Thyroid disease     pt denies this (12/18/14)  . Diabetes mellitus without complication ([place])     type 2 - diet controlled  . Complication of anesthesia     vomited after colonoscopy    Past Surgical History  Procedure Laterality Date  . Eye surgery Bilateral     cataract surgery with lens implant  . Colonoscopy    . Orif humerus fracture Right 12/22/2014    Procedure: OPEN REDUCTION INTERNAL FIXATION (ORIF) RIGHT DISTAL HUMERUS FRACTURE;  Surgeon: Mcarthur Rossetti, MD;  Location: [place];  Service: Orthopedics;  Laterality: Right;    There were no vitals filed for this visit.  Visit Diagnosis:  Elbow stiffness, right  Shoulder stiffness, right  Right shoulder pain  Right elbow pain  Wrist stiffness, right  Stiffness of right hand joint      Subjective Assessment - 05/24/15 0851    Subjective I was practicing driving at a praking lot, but very diffucult due to weakness and limited control Right UE   Currently in Pain? No/denies                         [facility] Adult PT Treatment/Exercise - 05/24/15 0001    Exercises   Exercises Wrist;Neck   Elbow Exercises   Elbow Flexion Strengthening;Right;10 reps;Standing;Bar weights/barbell  2#   Neck Exercises: Machines for Strengthening   UBE (Upper Arm Bike) level 0 x 4 (2/2) with initally help to start   Shoulder Exercises: Supine   Other Supine Exercises Velcro board (narrow strip) for pro/sup & flex/ext wrist x 2 each    Shoulder Exercises: Standing   Flexion AROM;Both;10 reps   Shoulder Flexion Weight (lbs) 2   ABduction --   Shoulder ABduction Weight (lbs) --   Other Standing Exercises wall ladder 2x 5 to peg #8    Other Standing Exercises shoulder rolls x 20   Shoulder Exercises: Pulleys   Flexion 3 minutes   ABduction 3 minutes   Wrist Exercises   Forearm Supination AROM;Both;Standing;10 reps  with 2 # weight   Forearm Pronation AROM;10 reps  with 2#   Moist Heat Therapy   Number Minutes Moist Heat 10 Minutes   Moist Heat Location Shoulder   Manual Therapy   Manual Therapy Passive ROM   Soft tissue mobilization to right scapula surrounding shoulder muscles   Passive ROM scapula, and shoulder joint to incr ROM shoulder                  PT Short Term Goals - 05/17/15 0900    PT SHORT TERM GOAL #1   Title independent with initital HEP for shoulder flexion and elbow ROM  Time 4   Period Weeks   Status Achieved   PT SHORT TERM GOAL #2   Title pain in right shoulder and elbow decreased >/= 25%   Time 4   Period Weeks   Status Achieved   PT SHORT TERM GOAL #3   Title right shoulder flexion PROM >/= 115 degrees   Time 4   Status Achieved   PT SHORT TERM GOAL #4   Title right elbow extension >/= -5 degrees   Time 4   Period Weeks   Status On-going   PT SHORT TERM GOAL #5   Title right elbow flexion >/= 110 degrees PROM   Time 4   Period Weeks   Status Achieved           PT Long Term Goals - 05/24/15 5188    PT LONG TERM GOAL #1   Title independent with HEP and understand how to progress herself   Time 6   Period Weeks   Status On-going   PT LONG TERM GOAL #2   Title grip strength on Rt >or = to 20#   Time 6   Period Weeks    Status On-going   PT LONG TERM GOAL #3   Title demonstrate Rt shoulder AROM IR to L3 to improve self-care with Rt UE   Time 6   Period Weeks   Status On-going   PT LONG TERM GOAL #4   Title return to driving due to right shoulder strength 4/5  practiced this weekend at a Parking lot, very difficult due to decr strength and weakness   Time 6   Period Weeks   Status On-going   PT LONG TERM GOAL #5   Title able to cook due to right elbow AROM within functional limits   Time 6   Period Weeks   Status On-going               Plan - 05/24/15 0911    Clinical Impression Statement was able to incr weight with pronation and supination and using 2# weight. Pt is able to use the UBE, Pt will continue to benefit from skilled PT to improve ROM shoulder, elbow and wrist and strength.   Rehab Potential Good   PT Frequency 2x / week   PT Duration 8 weeks   PT Next Visit Plan Continue to improve ROM Rt shoulder with focus on supination, manual therapy   PT Home Exercise Plan progress as needed   Consulted and Agree with Plan of Care Patient        Problem List Patient Active Problem List   Diagnosis Date Noted  . Right supracondylar humerus fracture; right radial head/neck fracture 12/22/2014    NAUMANN-HOUEGNIFIO,Jan Olano PTA 05/24/2015, 9:33 AM  [facility] [address] [place], [place], [address] Phone: [phone]   Fax:  [phone]  Name: Katherine Barnett MRN: [id number] Date of Birth: [birth year]

## 2015-05-26 ENCOUNTER — Encounter: Payer: Self-pay | Admitting: Physical Therapy

## 2015-05-26 ENCOUNTER — Ambulatory Visit: Payer: Medicare Other | Attending: Physician Assistant | Admitting: Physical Therapy

## 2015-05-26 DIAGNOSIS — M25631 Stiffness of right wrist, not elsewhere classified: Secondary | ICD-10-CM | POA: Insufficient documentation

## 2015-05-26 DIAGNOSIS — M25511 Pain in right shoulder: Secondary | ICD-10-CM | POA: Insufficient documentation

## 2015-05-26 DIAGNOSIS — M25521 Pain in right elbow: Secondary | ICD-10-CM | POA: Diagnosis present

## 2015-05-26 DIAGNOSIS — M25641 Stiffness of right hand, not elsewhere classified: Secondary | ICD-10-CM | POA: Diagnosis present

## 2015-05-26 DIAGNOSIS — M25611 Stiffness of right shoulder, not elsewhere classified: Secondary | ICD-10-CM | POA: Diagnosis present

## 2015-05-26 DIAGNOSIS — M25621 Stiffness of right elbow, not elsewhere classified: Secondary | ICD-10-CM | POA: Insufficient documentation

## 2015-05-26 NOTE — Therapy (Addendum)
[facility] [address], [place], [place], [address] Phone: [phone]   Fax:  [phone]  Physical Therapy Treatment  Patient Details  Name: Katherine Barnett MRN: [id number] Date of Birth: 07/08/1941 Referring Provider: Jean Rosenthal, MD  Encounter Date: 05/26/2015      PT End of Session - 05/26/15 1305    Visit Number 34   Number of Visits 39   Date for PT Re-Evaluation 06/25/15   PT Start Time 0843   PT Stop Time 0940   PT Time Calculation (min) 57 min   Activity Tolerance Patient tolerated treatment well   Behavior During Therapy [facility] for tasks assessed/performed      Past Medical History  Diagnosis Date  . Hypertension   . Thyroid disease     pt denies this (12/18/14)  . Diabetes mellitus without complication ([place])     type 2 - diet controlled  . Complication of anesthesia     vomited after colonoscopy    Past Surgical History  Procedure Laterality Date  . Eye surgery Bilateral     cataract surgery with lens implant  . Colonoscopy    . Orif humerus fracture Right 12/22/2014    Procedure: OPEN REDUCTION INTERNAL FIXATION (ORIF) RIGHT DISTAL HUMERUS FRACTURE;  Surgeon: Mcarthur Rossetti, MD;  Location: [place];  Service: Orthopedics;  Laterality: Right;    There were no vitals filed for this visit.  Visit Diagnosis:  Elbow stiffness, right  Shoulder stiffness, right  Wrist stiffness, right  Stiffness of right hand joint      Subjective Assessment - 05/26/15 1255    Subjective Pt reports feeling improvement with functional activities as cutting is getting better.    Currently in Pain? No/denies                         [facility] Adult PT Treatment/Exercise - 05/26/15 0001    Exercises   Exercises Wrist;Neck   Elbow Exercises   Elbow Flexion Strengthening;Right;10 reps;Standing;Bar weights/barbell  2#   Neck Exercises: Machines for Strengthening   UBE (Upper Arm  Bike) level 0 x 6 (3/3) with initally help to start   Shoulder Exercises: Sidelying   Flexion AROM;20 reps  needs A to initiate, challenging for patient   ABduction AROM  needs A initially, & ROM limited due to weakness   Shoulder Exercises: Standing   Flexion AROM;Both;20 reps   Shoulder Flexion Weight (lbs) 2   Other Standing Exercises wall ladder 4x 5 to peg #8    Other Standing Exercises shoulder rolls x 20   Shoulder Exercises: Pulleys   Flexion 3 minutes   ABduction 3 minutes   Wrist Exercises   Forearm Supination AROM;Both;Standing;10 reps  with 2# weight   Forearm Pronation AROM;20 reps  with 2 #   Moist Heat Therapy   Number Minutes Moist Heat 10 Minutes   Moist Heat Location Shoulder   Manual Therapy   Manual Therapy Passive ROM  to Rt shoulder in left sidelying   Soft tissue mobilization to right scapula surrounding shoulder muscles  subscapularis, rhomboids   Passive ROM scapula, and shoulder joint to incr ROM shoulder                  PT Short Term Goals - 05/17/15 0900    PT SHORT TERM GOAL #1   Title independent with initital HEP for shoulder flexion and elbow ROM   Time 4  Period Weeks   Status Achieved   PT SHORT TERM GOAL #2   Title pain in right shoulder and elbow decreased >/= 25%   Time 4   Period Weeks   Status Achieved   PT SHORT TERM GOAL #3   Title right shoulder flexion PROM >/= 115 degrees   Time 4   Status Achieved   PT SHORT TERM GOAL #4   Title right elbow extension >/= -5 degrees   Time 4   Period Weeks   Status On-going   PT SHORT TERM GOAL #5   Title right elbow flexion >/= 110 degrees PROM   Time 4   Period Weeks   Status Achieved           PT Long Term Goals - 05/24/15 8938    PT LONG TERM GOAL #1   Title independent with HEP and understand how to progress herself   Time 6   Period Weeks   Status On-going   PT LONG TERM GOAL #2   Title grip strength on Rt >or = to 20#   Time 6   Period Weeks   Status  On-going   PT LONG TERM GOAL #3   Title demonstrate Rt shoulder AROM IR to L3 to improve self-care with Rt UE   Time 6   Period Weeks   Status On-going   PT LONG TERM GOAL #4   Title return to driving due to right shoulder strength 4/5  practiced this weekend at a Parking lot, very difficult due to decr strength and weakness   Time 6   Period Weeks   Status On-going   PT LONG TERM GOAL #5   Title able to cook due to right elbow AROM within functional limits   Time 6   Period Weeks   Status On-going               Plan - 05/26/15 1306    Clinical Impression Statement Pt slowly progressding with ROM and strength and progressing. Pt challenged with sidelying exercises with Rt UE into abduction (in available range) and flexion. Pt will continue to benefit from skilled PT to improve ROM and strength.     Pt will benefit from skilled therapeutic intervention in order to improve on the following deficits Decreased range of motion;Increased fascial restricitons;Impaired UE functional use;Increased muscle spasms;Decreased endurance;Decreased activity tolerance;Pain;Impaired flexibility;Hypomobility;Decreased scar mobility;Decreased mobility;Decreased strength;Increased edema   Rehab Potential Good   PT Frequency 2x / week   PT Duration 8 weeks   PT Treatment/Interventions ADLs/Self Care Home Management;Cryotherapy;Electrical Stimulation;Ultrasound;Moist Heat;Functional mobility training;Therapeutic activities;Therapeutic exercise;Neuromuscular re-education;Manual techniques;Patient/family education;Scar mobilization;Passive range of motion;Taping   PT Next Visit Plan Continue to improve ROM Rt shoulder with focus on supination, manual therapy   PT Home Exercise Plan progress as needed   Consulted and Agree with Plan of Care Patient        Problem List Patient Active Problem List   Diagnosis Date Noted  . Right supracondylar humerus fracture; right radial head/neck fracture  12/22/2014    NAUMANN-HOUEGNIFIO,Mackenize Delgadillo PTA 05/26/2015, 1:11 PM  [facility] [address] [place], [place], [address] Phone: [phone]   Fax:  [phone]  Name: [place] Duquette MRN: [id number] Date of Birth: June 21, 1941

## 2015-05-28 ENCOUNTER — Encounter: Payer: Medicare Other | Admitting: Physical Therapy

## 2015-06-01 ENCOUNTER — Encounter: Payer: Self-pay | Admitting: Physical Therapy

## 2015-06-01 ENCOUNTER — Ambulatory Visit: Payer: Medicare Other | Admitting: Physical Therapy

## 2015-06-01 DIAGNOSIS — M25641 Stiffness of right hand, not elsewhere classified: Secondary | ICD-10-CM

## 2015-06-01 DIAGNOSIS — M25511 Pain in right shoulder: Secondary | ICD-10-CM

## 2015-06-01 DIAGNOSIS — M25621 Stiffness of right elbow, not elsewhere classified: Secondary | ICD-10-CM | POA: Diagnosis not present

## 2015-06-01 DIAGNOSIS — M25631 Stiffness of right wrist, not elsewhere classified: Secondary | ICD-10-CM

## 2015-06-01 DIAGNOSIS — M25521 Pain in right elbow: Secondary | ICD-10-CM

## 2015-06-01 DIAGNOSIS — M25611 Stiffness of right shoulder, not elsewhere classified: Secondary | ICD-10-CM

## 2015-06-01 NOTE — Therapy (Signed)
[facility] [address], [place], [place], [address] Phone: [phone]   Fax:  [phone]  Physical Therapy Treatment  Patient Details  Name: Katherine Barnett MRN: [id number] Date of Birth: [birth year] Referring Provider: Dr. Jean Rosenthal  Encounter Date: 06/01/2015      PT End of Session - 06/01/15 0853    Visit Number 35   Number of Visits 39  Medicare   Date for PT Re-Evaluation 06/25/15   PT Start Time 0840   PT Stop Time 0940   PT Time Calculation (min) 60 min   Activity Tolerance Patient tolerated treatment well   Behavior During Therapy [facility] for tasks assessed/performed      Past Medical History  Diagnosis Date  . Hypertension   . Thyroid disease     pt denies this (12/18/14)  . Diabetes mellitus without complication ([place])     type 2 - diet controlled  . Complication of anesthesia     vomited after colonoscopy    Past Surgical History  Procedure Laterality Date  . Eye surgery Bilateral     cataract surgery with lens implant  . Colonoscopy    . Orif humerus fracture Right 12/22/2014    Procedure: OPEN REDUCTION INTERNAL FIXATION (ORIF) RIGHT DISTAL HUMERUS FRACTURE;  Surgeon: Mcarthur Rossetti, MD;  Location: [place];  Service: Orthopedics;  Laterality: Right;    There were no vitals filed for this visit.  Visit Diagnosis:  Elbow stiffness, right  Shoulder stiffness, right  Wrist stiffness, right  Right shoulder pain  Right elbow pain  Stiffness of right hand joint      Subjective Assessment - 06/01/15 0859    Subjective I am trying to raise my arms higher.   Pertinent History Open reduction internal fixation (ORIF)  Rt distal humerus fracture on 12/22/2014   Patient Stated Goals Patient wants to put her bra on, drive, dress, and use right arm for daily tasks   Currently in Pain? No/denies            [facility] PT Assessment - 06/01/15 0001    Assessment   Medical  Diagnosis s/p ORIF right Supra Condylar Humerus Fracture 6/1/201; Right proimal humerus  fracture   Referring Provider Dr. Jean Rosenthal   Onset Date/Surgical Date 12/22/14   Hand Dominance Right   Precautions   Precautions Shoulder   Precaution Comments osteoporosis   AROM   AROM Assessment Site Shoulder   Right/Left Shoulder Right   Right Shoulder Extension 48 Degrees   Right Shoulder Flexion 90 Degrees   Right Shoulder ABduction 85 Degrees   Right/Left Elbow Right   Right Elbow Flexion 105   Right Elbow Extension -14   Right/Left Forearm Right   Right Forearm Supination 20 Degrees   Strength   Right/Left hand Right   Right Hand Grip (lbs) 5                     [facility] Adult PT Treatment/Exercise - 06/01/15 0001    Elbow Exercises   Elbow Flexion Strengthening;Right;10 reps;Standing;Bar weights/barbell  2#   Forearm Supination AROM;Both;Standing;10 reps  with 2# weight   Forearm Pronation AROM;20 reps  with 2 #   Shoulder Exercises: Standing   Flexion AROM;Both;20 reps   Shoulder Flexion Weight (lbs) 2   Other Standing Exercises wall ladder 4x 5 to peg #8    Shoulder Exercises: Pulleys   Flexion 3 minutes   ABduction 3 minutes   Shoulder  Exercises: ROM/Strengthening   UBE (Upper Arm Bike) Level 0 3 min forward/ 3 mjn backward   Hand Exercises   Other Hand Exercises squeeze  blue ball while on heat   Wrist Exercises   Other wrist exercises Velcro board pro/sup wrist flex/ext narrow strip small spoon  tactile cues to keep shoulder relaxed   Moist Heat Therapy   Number Minutes Moist Heat 10 Minutes   Moist Heat Location Shoulder                PT Education - 06/01/15 0928    Education provided No          PT Short Term Goals - 06/01/15 0914    PT SHORT TERM GOAL #4   Title right elbow extension >/= -5 degrees   Time 4   Period Weeks   Status On-going  -14           PT Long Term Goals - 06/01/15 0915    PT LONG TERM GOAL  #1   Title independent with HEP and understand how to progress herself   Time 6   Period Weeks   Status On-going   PT LONG TERM GOAL #2   Title grip strength on Rt >or = to 20#   Time 6   Period Weeks   Status On-going  5#   PT LONG TERM GOAL #3   Title demonstrate Rt shoulder AROM IR to L3 to improve self-care with Rt UE   Time 6   Period Weeks   Status On-going   PT LONG TERM GOAL #4   Title return to driving due to right shoulder strength 4/5   Time 6   Period Weeks   Status On-going  practicing right now for 15 min.    PT LONG TERM GOAL #5   Title able to cook due to right elbow AROM within functional limits   Time 6   Period Weeks   Status On-going  cooking but arm week               Plan - 06/01/15 0916    Clinical Impression Statement Patient is using scissors a little due to increased strength.  Patient is unable to cut a onion. Patient needs tactile cues to isolate movement and not hike up her shoulder to compensate.  Right shoulder AROM in degrees: extension 43, flexion 90, and abduction 85.  Grip strength is 5#. Patient has increased AROM and grip strength.  Patient is practicing drivng for 15 min.  Patient  will benefit form physical therapy to work on ROM and strenth to return to prior functional level.    Pt will benefit from skilled therapeutic intervention in order to improve on the following deficits Decreased range of motion;Increased fascial restricitons;Impaired UE functional use;Increased muscle spasms;Decreased endurance;Decreased activity tolerance;Pain;Impaired flexibility;Hypomobility;Decreased scar mobility;Decreased mobility;Decreased strength;Increased edema   Rehab Potential Good   Clinical Impairments Affecting Rehab Potential None   PT Frequency 2x / week   PT Duration 8 weeks   PT Treatment/Interventions ADLs/Self Care Home Management;Cryotherapy;Electrical Stimulation;Ultrasound;Moist Heat;Functional mobility training;Therapeutic  activities;Therapeutic exercise;Neuromuscular re-education;Manual techniques;Patient/family education;Scar mobilization;Passive range of motion;Taping   PT Next Visit Plan Continue to improve ROM Rt shoulder with focus on supination, manual therapy; needs tactile cues to prevent shoulders from hiking   PT Home Exercise Plan update HEP   Consulted and Agree with Plan of Care Patient        Problem List Patient Active Problem List   Diagnosis Date  Noted  . Right supracondylar humerus fracture; right radial head/neck fracture 12/22/2014    Aamani Moose,PT 06/01/2015, 9:30 AM  [facility] [address] [place], [place], [address] Phone: [phone]   Fax:  [phone]  Name: Ruthia Person MRN: [id number] Date of Birth: 1941-03-02

## 2015-06-03 ENCOUNTER — Ambulatory Visit: Payer: Medicare Other | Admitting: Physical Therapy

## 2015-06-03 ENCOUNTER — Encounter: Payer: Self-pay | Admitting: Physical Therapy

## 2015-06-03 DIAGNOSIS — M25621 Stiffness of right elbow, not elsewhere classified: Secondary | ICD-10-CM | POA: Diagnosis not present

## 2015-06-03 DIAGNOSIS — M25641 Stiffness of right hand, not elsewhere classified: Secondary | ICD-10-CM

## 2015-06-03 DIAGNOSIS — M25611 Stiffness of right shoulder, not elsewhere classified: Secondary | ICD-10-CM

## 2015-06-03 DIAGNOSIS — M25521 Pain in right elbow: Secondary | ICD-10-CM

## 2015-06-03 DIAGNOSIS — M25631 Stiffness of right wrist, not elsewhere classified: Secondary | ICD-10-CM

## 2015-06-03 DIAGNOSIS — M25511 Pain in right shoulder: Secondary | ICD-10-CM
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
## 2015-06-08 ENCOUNTER — Encounter: Payer: Self-pay | Admitting: Physical Therapy

## 2015-06-08 ENCOUNTER — Ambulatory Visit: Payer: Medicare Other | Admitting: Physical Therapy

## 2015-06-08 DIAGNOSIS — M25621 Stiffness of right elbow, not elsewhere classified: Secondary | ICD-10-CM

## 2015-06-08 DIAGNOSIS — M25631 Stiffness of right wrist, not elsewhere classified: Secondary | ICD-10-CM

## 2015-06-08 DIAGNOSIS — M25521 Pain in right elbow: Secondary | ICD-10-CM

## 2015-06-08 DIAGNOSIS — M25611 Stiffness of right shoulder, not elsewhere classified: Secondary | ICD-10-CM

## 2015-06-08 DIAGNOSIS — M25511 Pain in right shoulder: Secondary | ICD-10-CM

## 2015-06-08 DIAGNOSIS — M25641 Stiffness of right hand, not elsewhere classified: Secondary | ICD-10-CM

## 2015-06-08 NOTE — Therapy (Signed)
[facility] [address], [address] [place], [place], [address] Phone: [phone]   Fax:  [phone]  Physical Therapy Treatment  Patient Details  Name: Katherine Barnett MRN: [id number] Date of Birth: 04/16/1941 Referring Provider: Blackman  Encounter Date: 06/08/2015      PT End of Session - 06/08/15 0915    Visit Number 37   Number of Visits 39   Date for PT Re-Evaluation 06/25/15   PT Start Time 0844   PT Stop Time 0939   PT Time Calculation (min) 55 min   Activity Tolerance Patient tolerated treatment well   Behavior During Therapy [facility] for tasks assessed/performed      Past Medical History  Diagnosis Date  . Hypertension   . Thyroid disease     pt denies this (12/18/14)  . Diabetes mellitus without complication ([facility])     type 2 - diet controlled  . Complication of anesthesia     vomited after colonoscopy    Past Surgical History  Procedure Laterality Date  . Eye surgery Bilateral     cataract surgery with lens implant  . Colonoscopy    . Orif humerus fracture Right 12/22/2014    Procedure: OPEN REDUCTION INTERNAL FIXATION (ORIF) RIGHT DISTAL HUMERUS FRACTURE;  Surgeon: Christopher Y Blackman, MD;  [facility];  Service: Orthopedics;  Laterality: Right;    There were no vitals filed for this visit.  Visit Diagnosis:  Elbow stiffness, right  Shoulder stiffness, right  Wrist stiffness, right  Right shoulder pain  Right elbow pain  Stiffness of right hand joint      Subjective Assessment - 06/08/15 0859    Subjective Pt was driving again and is improving with her confidence with driving. Pt was able to prepare a poundcake.   Pertinent History Open reduction internal fixation (ORIF)  Rt distal humerus fracture on 12/22/2014   Currently in Pain? No/denies                         [facility] Adult PT Treatment/Exercise - 06/08/15 0001    Exercises   Exercises Wrist;Neck   Elbow  Exercises   Elbow Flexion Strengthening;Right;10 reps;Standing;Bar weights/barbell  3#   Shoulder Exercises: Supine   Other Supine Exercises Velcro board (narrow strip) for pro/sup & flex/ext wrist x 4 each    Shoulder Exercises: Standing   Other Standing Exercises wall ladder 5x 5 to peg #9    Other Standing Exercises shoulder rolls x 20   Shoulder Exercises: Pulleys   Flexion 3 minutes;1 minute   ABduction 3 minutes;1 minute  in standing   Shoulder Exercises: ROM/Strengthening   UBE (Upper Arm Bike) Level 1 8min(4/4), pt with compensatory movement of shoulder hikking, but improving   Wrist Exercises   Forearm Supination AROM;Both;Standing;10 reps  2#   Forearm Pronation AROM;20 reps  #2   Other wrist exercises Velcro board pro/sup wrist flex/ext narrow strip small spoon  verbal cues to keep shoulder re   Moist Heat Therapy   Number Minutes Moist Heat 10 Minutes   Moist Heat Location Shoulder   Manual Therapy   Manual Therapy Passive ROM  mobilisation of prox radius head                  PT Short Term Goals - 06/01/15 0914    PT SHORT TERM GOAL #4   Title right elbow extension >/= -5 degrees   Time 4   Period Weeks     Status On-going  -14           PT Long Term Goals - 06/08/15 1244    PT LONG TERM GOAL #1   Title independent with HEP and understand how to progress herself   Time 6   Period Weeks   Status On-going   PT LONG TERM GOAL #3   Title demonstrate Rt shoulder AROM IR to L3 to improve self-care with Rt UE   Time 6   Period Weeks   Status On-going   PT LONG TERM GOAL #4   Title return to driving due to right shoulder strength 4/5   Time 6   Period Weeks   Status Partially Met   PT LONG TERM GOAL #5   Title able to cook due to right elbow AROM within functional limits  was able to prepare a poundcake, increased time needed   Time 6   Period Weeks   Status Partially Met               Plan - 06/08/15 0916    Clinical Impression  Statement Pt able to constant reach peg#9, on velcro board using wide stripe today with spoon. Pt will continue to benefit from skilled PT to imcrease ROM and strength to return to prior functional level..    Pt will benefit from skilled therapeutic intervention in order to improve on the following deficits Decreased range of motion;Increased fascial restricitons;Impaired UE functional use;Increased muscle spasms;Decreased endurance;Decreased activity tolerance;Pain;Impaired flexibility;Hypomobility;Decreased scar mobility;Decreased mobility;Decreased strength;Increased edema   Rehab Potential Good   PT Frequency 2x / week   PT Duration 8 weeks   PT Treatment/Interventions ADLs/Self Care Home Management;Cryotherapy;Electrical Stimulation;Ultrasound;Moist Heat;Functional mobility training;Therapeutic activities;Therapeutic exercise;Neuromuscular re-education;Manual techniques;Patient/family education;Scar mobilization;Passive range of motion;Taping   PT Next Visit Plan Continue to improve ROM Rt shoulder with focus on supination, manual therapy; needs tactile cues to prevent shoulders from hiking   PT Home Exercise Plan continue current HEP   Consulted and Agree with Plan of Care Patient        Problem List Patient Active Problem List   Diagnosis Date Noted  . Right supracondylar humerus fracture; right radial head/neck fracture 12/22/2014    NAUMANN-HOUEGNIFIO,ELKE  PTA  06/08/2015, 12:48 PM  [facility] [address], [address] [place], [place], [address] Phone: [phone]   Fax:  [phone]  Name: Katherine Barnett MRN: [id number] Date of Birth: 06/13/1941     

## 2015-06-10 ENCOUNTER — Encounter: Payer: Self-pay | Admitting: Physical Therapy

## 2015-06-10 ENCOUNTER — Ambulatory Visit: Payer: Medicare Other | Admitting: Physical Therapy

## 2015-06-10 DIAGNOSIS — M25611 Stiffness of right shoulder, not elsewhere classified: Secondary | ICD-10-CM

## 2015-06-10 DIAGNOSIS — M25621 Stiffness of right elbow, not elsewhere classified: Secondary | ICD-10-CM

## 2015-06-10 DIAGNOSIS — M25631 Stiffness of right wrist, not elsewhere classified: Secondary | ICD-10-CM

## 2015-06-10 DIAGNOSIS — M25521 Pain in right elbow: Secondary | ICD-10-CM

## 2015-06-10 DIAGNOSIS — M25511 Pain in right shoulder: Secondary | ICD-10-CM

## 2015-06-10 DIAGNOSIS — M25641 Stiffness of right hand, not elsewhere classified: Secondary | ICD-10-CM

## 2015-06-10 NOTE — Therapy (Signed)
[facility] [address], [place], [place], [address] Phone: [phone]   Fax:  [phone]  Physical Therapy Treatment  Patient Details  Name: Katherine Barnett MRN: [id number] Date of Birth: 1940/11/21 Referring Provider: Ninfa Linden  Encounter Date: 06/10/2015      PT End of Session - 06/10/15 0909    Visit Number 38   Number of Visits 39   Date for PT Re-Evaluation 06/25/15   PT Start Time 0844   PT Stop Time 0940   PT Time Calculation (min) 56 min   Activity Tolerance Patient tolerated treatment well   Behavior During Therapy [facility] for tasks assessed/performed      Past Medical History  Diagnosis Date  . Hypertension   . Thyroid disease     pt denies this (12/18/14)  . Diabetes mellitus without complication ([place])     type 2 - diet controlled  . Complication of anesthesia     vomited after colonoscopy    Past Surgical History  Procedure Laterality Date  . Eye surgery Bilateral     cataract surgery with lens implant  . Colonoscopy    . Orif humerus fracture Right 12/22/2014    Procedure: OPEN REDUCTION INTERNAL FIXATION (ORIF) RIGHT DISTAL HUMERUS FRACTURE;  Surgeon: Mcarthur Rossetti, MD;  Location: [place];  Service: Orthopedics;  Laterality: Right;    There were no vitals filed for this visit.  Visit Diagnosis:  Elbow stiffness, right  Shoulder stiffness, right  Wrist stiffness, right  Right elbow pain  Right shoulder pain  Stiffness of right hand joint      Subjective Assessment - 06/10/15 0857    Subjective Pt reports shoulder movement is getting better, however right hand presents with edema and unable to perform a wrist, notices challenges with turning the steering wheel.    Currently in Pain? No/denies                         [facility] Adult PT Treatment/Exercise - 06/10/15 0001    Exercises   Exercises Wrist;Neck   Elbow Exercises   Elbow Flexion  Strengthening;Right;10 reps;Standing;Bar weights/barbell  right hand 2#, left hand 3#   Shoulder Exercises: Supine   Other Supine Exercises Velcro board wide strip for pro/sup & flex/ext wrist x 1 each    Shoulder Exercises: Standing   Other Standing Exercises wall ladder 2x 5 to peg #10    Other Standing Exercises shoulder rolls x 20   Shoulder Exercises: Pulleys   Flexion 3 minutes   ABduction 3 minutes  in standing   Shoulder Exercises: ROM/Strengthening   UBE (Upper Arm Bike) Level 1 25mn(4/4), pt with compensatory movement of shoulder hikking, but improving  no help needed to start   Wrist Exercises   Forearm Supination AROM;Both;Standing;20 reps  2#   Forearm Pronation AROM;20 reps  2#   Moist Heat Therapy   Number Minutes Moist Heat 10 Minutes   Moist Heat Location Shoulder  shoulder positioned on hi/lo table for prolonged stretch    Manual Therapy   Manual Therapy Passive ROM  mobilisation of prox radiushead                  PT Short Term Goals - 06/01/15 0914    PT SHORT TERM GOAL #4   Title right elbow extension >/= -5 degrees   Time 4   Period Weeks   Status On-going  -14  PT Long Term Goals - 06/08/15 1244    PT LONG TERM GOAL #1   Title independent with HEP and understand how to progress herself   Time 6   Period Weeks   Status On-going   PT LONG TERM GOAL #3   Title demonstrate Rt shoulder AROM IR to L3 to improve self-care with Rt UE   Time 6   Period Weeks   Status On-going   PT LONG TERM GOAL #4   Title return to driving due to right shoulder strength 4/5   Time 6   Period Weeks   Status Partially Met   PT LONG TERM GOAL #5   Title able to cook due to right elbow AROM within functional limits  was able to prepare a poundcake, increased time needed   Time 6   Period Weeks   Status Partially Met               Plan - 06/10/15 0910    Clinical Impression Statement Pt with improved performance on UBE, less  compensatory movement. Pt will continue to benefit from skilled PT to incr. ROM and strength to return to prior functional level.   Pt will benefit from skilled therapeutic intervention in order to improve on the following deficits Decreased range of motion;Increased fascial restricitons;Impaired UE functional use;Increased muscle spasms;Decreased endurance;Decreased activity tolerance;Pain;Impaired flexibility;Hypomobility;Decreased scar mobility;Decreased mobility;Decreased strength;Increased edema   Rehab Potential Good   PT Frequency 2x / week   PT Duration 8 weeks   PT Treatment/Interventions ADLs/Self Care Home Management;Cryotherapy;Electrical Stimulation;Ultrasound;Moist Heat;Functional mobility training;Therapeutic activities;Therapeutic exercise;Neuromuscular re-education;Manual techniques;Patient/family education;Scar mobilization;Passive range of motion;Taping   PT Next Visit Plan Continue to improve ROM Rt shoulder with focus on supination, manual therapy; needs tactile cues to prevent shoulders from hiking   PT Home Exercise Plan continue current HEP   Consulted and Agree with Plan of Care Patient        Problem List Patient Active Problem List   Diagnosis Date Noted  . Right supracondylar humerus fracture; right radial head/neck fracture 12/22/2014    NAUMANN-HOUEGNIFIO,Katherine Barnett 06/10/2015, 9:32 AM  [facility] [address] [place], [place], [address] Phone: [phone]   Fax:  [phone]  Name: Katherine Barnett MRN: [id number] Date of Birth: Dec 21, 1940

## 2015-06-15 ENCOUNTER — Encounter: Payer: Self-pay | Admitting: Physical Therapy

## 2015-06-15 ENCOUNTER — Ambulatory Visit: Payer: Medicare Other | Admitting: Physical Therapy

## 2015-06-15 DIAGNOSIS — M25511 Pain in right shoulder: Secondary | ICD-10-CM

## 2015-06-15 DIAGNOSIS — M25641 Stiffness of right hand, not elsewhere classified: Secondary | ICD-10-CM

## 2015-06-15 DIAGNOSIS — M25521 Pain in right elbow: Secondary | ICD-10-CM

## 2015-06-15 DIAGNOSIS — M25621 Stiffness of right elbow, not elsewhere classified: Secondary | ICD-10-CM

## 2015-06-15 DIAGNOSIS — M25611 Stiffness of right shoulder, not elsewhere classified: Secondary | ICD-10-CM

## 2015-06-15 DIAGNOSIS — M25631 Stiffness of right wrist, not elsewhere classified: Secondary | ICD-10-CM

## 2015-06-15 NOTE — Therapy (Signed)
[facility] [address], [address] [place], [place], [address] Phone: [phone]   Fax:  [phone]  Physical Therapy Treatment  Patient Details  Name: Katherine Barnett MRN: [id number] Date of Birth: 09/09/1940 Referring Provider: Dr. Christopher Blackman  Encounter Date: 06/15/2015      PT End of Session - 06/15/15 0847    Visit Number 39   Number of Visits 49   Date for PT Re-Evaluation 07/23/15   PT Start Time 0845   PT Stop Time 0940   PT Time Calculation (min) 55 min   Activity Tolerance Patient tolerated treatment well   Behavior During Therapy WFL for tasks assessed/performed      Past Medical History  Diagnosis Date  . Hypertension   . Thyroid disease     pt denies this (12/18/14)  . Diabetes mellitus without complication (HCC)     type 2 - diet controlled  . Complication of anesthesia     vomited after colonoscopy    Past Surgical History  Procedure Laterality Date  . Eye surgery Bilateral     cataract surgery with lens implant  . Colonoscopy    . Orif humerus fracture Right 12/22/2014    Procedure: OPEN REDUCTION INTERNAL FIXATION (ORIF) RIGHT DISTAL HUMERUS FRACTURE;  Surgeon: Christopher Y Blackman, MD;  Location: [facility];  Service: Orthopedics;  Laterality: Right;    There were no vitals filed for this visit.  Visit Diagnosis:  Elbow stiffness, right - Plan: PT plan of care cert/re-cert  Shoulder stiffness, right - Plan: PT plan of care cert/re-cert  Wrist stiffness, right - Plan: PT plan of care cert/re-cert  Right elbow pain - Plan: PT plan of care cert/re-cert  Right shoulder pain - Plan: PT plan of care cert/re-cert  Stiffness of right hand joint - Plan: PT plan of care cert/re-cert      Subjective Assessment - 06/15/15 0849    Subjective I feel therapy has helped me with being able to take care of myself.  I can drive, use scissors, take a shower.  Pain is 95% better.    Pertinent  History Open reduction internal fixation (ORIF)  Rt distal humerus fracture on 12/22/2014   Patient Stated Goals Patient wants to put her bra on, drive, dress, and use right arm for daily tasks   Currently in Pain? No/denies            [facility] PT Assessment - 06/15/15 0001    Assessment   Medical Diagnosis s/p ORIF right Supra Condylar Humerus Fracture 6/1/201; Right proimal humerus  fracture   Referring Provider Dr. Christopher Blackman   Onset Date/Surgical Date 12/22/14   Hand Dominance Right   Next MD Visit Nov 30   Precautions   Precautions Shoulder   Precaution Comments osteoporosis   Balance Screen   Has the patient fallen in the past 6 months Yes   How many times? 1   Has the patient had a decrease in activity level because of a fear of falling?  No   Is the patient reluctant to leave their home because of a fear of falling?  No   Prior Function   Level of Independence Independent   Cognition   Overall Cognitive Status Within Functional Limits for tasks assessed   Observation/Other Assessments   Focus on Therapeutic Outcomes (FOTO)  54% limitation   AROM   AROM Assessment Site Shoulder   Right/Left Shoulder Right   Right Shoulder Extension 48 Degrees     Right Shoulder Flexion 90 Degrees   Right Shoulder ABduction 85 Degrees   Right/Left Elbow Right   Right Elbow Flexion 95   Right Elbow Extension -14   Right/Left Forearm Right   Right Forearm Supination 20 Degrees   Right/Left Wrist Right   Right Wrist Extension 60 Degrees   Right Wrist Flexion 60 Degrees   Right/Left Finger Right   Right Composite Finger Flexion 50%   PROM   PROM Assessment Site Shoulder   Right/Left Shoulder Right   Right Shoulder Flexion 130 Degrees   Right Shoulder ABduction 100 Degrees   Right Shoulder Internal Rotation 10 Degrees   Right Shoulder External Rotation 45 Degrees   Right/Left Elbow Right   Right Elbow Flexion 115   Right Elbow Extension -9   Right Forearm Supination 15  Degrees   Strength   Strength Assessment Site Hand   Right/Left Shoulder Right   Right Shoulder Flexion 3+/5   Right Shoulder Extension 4/5   Right Shoulder ABduction 3+/5   Right Shoulder Internal Rotation 4/5   Right Shoulder External Rotation 3-/5   Right/Left hand Right   Right Hand Grip (lbs) 5                     [facility] Adult PT Treatment/Exercise - 06/15/15 0001    Shoulder Exercises: Standing   Other Standing Exercises wall ladder 2x 5 to peg #10    Shoulder Exercises: Pulleys   Flexion 3 minutes   ABduction 3 minutes  in standing   Shoulder Exercises: ROM/Strengthening   UBE (Upper Arm Bike) Level 1 67mn(4/4), pt with compensatory movement of shoulder hikking, but improving  no help needed to start   Moist Heat Therapy   Number Minutes Moist Heat 15 Minutes   Moist Heat Location Shoulder  shoulder positioned on hi/lo table for prolonged stretch                   PT Short Term Goals - 06/15/15 06286   PT SHORT TERM GOAL #1   Title independent with initital HEP for shoulder flexion and elbow ROM   Time 4   Period Weeks   Status Achieved   PT SHORT TERM GOAL #2   Title pain in right shoulder and elbow decreased >/= 25%   Time 4   Period Weeks   Status Achieved   PT SHORT TERM GOAL #3   Title right shoulder flexion PROM >/= 115 degrees   Time 4   Period Weeks   Status Achieved   PT SHORT TERM GOAL #4   Title right elbow extension >/= -5 degrees   Time 4   Period Weeks   Status On-going   PT SHORT TERM GOAL #5   Title right elbow flexion >/= 110 degrees PROM   Time 4   Period Weeks   Status Achieved           PT Long Term Goals - 06/15/15 03817   PT LONG TERM GOAL #1   Title independent with HEP and understand how to progress herself   Time 6   Period Weeks   Status On-going  still learning   PT LONG TERM GOAL #2   Title grip strength on Rt >or = to 20#   Time 6   Period Weeks   Status On-going   PT LONG TERM GOAL #3    Title demonstrate Rt shoulder AROM IR to L3 to improve self-care with Rt  UE   Time 6   Status On-going   PT LONG TERM GOAL #4   Title return to driving due to right shoulder strength 4/5   Time 6   Period Weeks   Status Partially Met   PT LONG TERM GOAL #5   Title able to cook due to right elbow AROM within functional limits   Time 6   Period Weeks   Status Partially Met               Plan - 06/15/15 0916    Clinical Impression Statement Patient is a 74 year old female with diagnosis of s/p ORIF right supra condylar humerus 12/23/2014 and right proximal humeral fracture.  Patient is now abel to drive with limited hours, shower herself, sook small meal, brush hair, dress herself, put her bra on, and write if she  takes her time.  Patient has difficulty with fine motor activities and eat.  Patient has limited shoulder motion and strength making it difficult  to reach overhead and lift items.  Patien thas limied right elbow and finger ROM making it difficult for fine motor tasks and eating. Patient would benefit form phsyical therapy to improve right UE ROM and strenght.    Pt will benefit from skilled therapeutic intervention in order to improve on the following deficits Decreased range of motion;Increased fascial restricitons;Impaired UE functional use;Increased muscle spasms;Decreased endurance;Decreased activity tolerance;Pain;Impaired flexibility;Hypomobility;Decreased scar mobility;Decreased mobility;Decreased strength;Increased edema   Clinical Impairments Affecting Rehab Potential None   PT Frequency 2x / week   PT Duration 4 weeks   PT Treatment/Interventions ADLs/Self Care Home Management;Cryotherapy;Electrical Stimulation;Ultrasound;Moist Heat;Functional mobility training;Therapeutic activities;Therapeutic exercise;Neuromuscular re-education;Manual techniques;Patient/family education;Scar mobilization;Passive range of motion;Taping   PT Next Visit Plan Continue to improve ROM  Rt shoulder with focus on supination, manual therapy; needs tactile cues to prevent shoulders from hiking   PT Home Exercise Plan continue current HEP   Consulted and Agree with Plan of Care Patient          G-Codes - 06/15/15 0859    Functional Assessment Tool Used FOTO score is 54% limitation   Functional Limitation Other PT primary   Other PT Primary Current Status (G8990) At least 40 percent but less than 60 percent impaired, limited or restricted   Other PT Primary Goal Status (G8991) At least 20 percent but less than 40 percent impaired, limited or restricted      Problem List Patient Active Problem List   Diagnosis Date Noted  . Right supracondylar humerus fracture; right radial head/neck fracture 12/22/2014    GRAY,CHERYL,PT 06/15/2015, 9:28 AM  [facility] [address], [address] [place], [place], [address] Phone: [phone]   Fax:  [phone]  Name: Katherine Barnett MRN: [id number] Date of Birth: 04/11/1941     

## 2015-06-22 ENCOUNTER — Ambulatory Visit: Payer: Medicare Other | Admitting: Physical Therapy

## 2015-06-22 ENCOUNTER — Encounter: Payer: Self-pay | Admitting: Physical Therapy

## 2015-06-22 DIAGNOSIS — M25621 Stiffness of right elbow, not elsewhere classified: Secondary | ICD-10-CM | POA: Diagnosis not present

## 2015-06-22 DIAGNOSIS — M25521 Pain in right elbow: Secondary | ICD-10-CM

## 2015-06-22 DIAGNOSIS — M25511 Pain in right shoulder: Secondary | ICD-10-CM

## 2015-06-22 DIAGNOSIS — M25641 Stiffness of right hand, not elsewhere classified: Secondary | ICD-10-CM

## 2015-06-22 DIAGNOSIS — M25631 Stiffness of right wrist, not elsewhere classified: Secondary | ICD-10-CM

## 2015-06-22 DIAGNOSIS — M25611 Stiffness of right shoulder, not elsewhere classified: Secondary | ICD-10-CM

## 2015-06-22 NOTE — Therapy (Signed)
[facility] [address], [place], [place], [address] Phone: [phone]   Fax:  [phone]  Physical Therapy Treatment  Patient Details  Name: Katherine Barnett MRN: [id number] Date of Birth: 06-30-1941 Referring Provider: Dr. Jean Rosenthal  Encounter Date: 06/22/2015      PT End of Session - 06/22/15 0901    Visit Number 40   Number of Visits 49   Date for PT Re-Evaluation 07/23/15   PT Start Time 0846   PT Stop Time 0942   PT Time Calculation (min) 56 min   Activity Tolerance Patient tolerated treatment well   Behavior During Therapy [facility] for tasks assessed/performed      Past Medical History  Diagnosis Date  . Hypertension   . Thyroid disease     pt denies this (12/18/14)  . Diabetes mellitus without complication ([place])     type 2 - diet controlled  . Complication of anesthesia     vomited after colonoscopy    Past Surgical History  Procedure Laterality Date  . Eye surgery Bilateral     cataract surgery with lens implant  . Colonoscopy    . Orif humerus fracture Right 12/22/2014    Procedure: OPEN REDUCTION INTERNAL FIXATION (ORIF) RIGHT DISTAL HUMERUS FRACTURE;  Surgeon: Mcarthur Rossetti, MD;  Location: [facility];  Service: Orthopedics;  Laterality: Right;    There were no vitals filed for this visit.  Visit Diagnosis:  Elbow stiffness, right  Shoulder stiffness, right  Wrist stiffness, right  Right elbow pain  Right shoulder pain  Stiffness of right hand joint      Subjective Assessment - 06/22/15 0850    Subjective Pt states therapy helped being able to take care of herself and able to drive again. Pain is 95% better.    Pertinent History Open reduction internal fixation (ORIF)  Rt distal humerus fracture on 12/22/2014   Patient Stated Goals Patient wants to put her bra on, drive, dress, and use right arm for daily tasks   Currently in Pain? No/denies                          [facility] Adult PT Treatment/Exercise - 06/22/15 0001    Shoulder Exercises: Standing   Flexion 10 reps;AAROM  on dorrfram for stretching   ABduction AAROM;10 reps  at doorframe, very difficult, change stanging against wall 2   Other Standing Exercises shoulder rolls x 20   Shoulder Exercises: Pulleys   Flexion 3 minutes   ABduction 3 minutes   Shoulder Exercises: ROM/Strengthening   UBE (Upper Arm Bike) Level 1 32mn(4/4), pt with compensatory movement of shoulder hikking, but improving  no help needed to start   Moist Heat Therapy   Number Minutes Moist Heat 15 Minutes   Moist Heat Location Shoulder  positioned on hi/Lo table for prolonged stretch   Manual Therapy   Manual Therapy Passive ROM   Soft tissue mobilization to right scapula surrounding shoulder muscles   Passive ROM scapula, and shoulder joint to incr ROM shoulder                  PT Short Term Goals - 06/15/15 07616   PT SHORT TERM GOAL #1   Title independent with initital HEP for shoulder flexion and elbow ROM   Time 4   Period Weeks   Status Achieved   PT SHORT TERM GOAL #2   Title pain in right  shoulder and elbow decreased >/= 25%   Time 4   Period Weeks   Status Achieved   PT SHORT TERM GOAL #3   Title right shoulder flexion PROM >/= 115 degrees   Time 4   Period Weeks   Status Achieved   PT SHORT TERM GOAL #4   Title right elbow extension >/= -5 degrees   Time 4   Period Weeks   Status On-going   PT SHORT TERM GOAL #5   Title right elbow flexion >/= 110 degrees PROM   Time 4   Period Weeks   Status Achieved           PT Long Term Goals - 06/22/15 3559    PT LONG TERM GOAL #1   Title independent with HEP and understand how to progress herself   Time 8   Period Weeks   Status On-going   PT LONG TERM GOAL #2   Title grip strength on Rt >or = to 20#   Time 6   Period Weeks   Status On-going   PT LONG TERM GOAL #3   Title demonstrate Rt  shoulder AROM IR to L3 to improve self-care with Rt UE   Time 6   Period Weeks   Status On-going   PT LONG TERM GOAL #5   Title able to cook due to right elbow AROM within functional limits   Time 6   Period Weeks   Status Partially Met               Plan - 06/22/15 0904    Clinical Impression Statement Patient is 74 year old female with diagnosis of s/p ORIF right supra condylar 12/23/2014 and right proximal humerus fracture. Patient is now able to drive with limited hours, shower herself, prepare small meals, brush hair, put her bra on, and write if she takes her time. Patient has difficultiy with fine motor activities and eating. Patient still with limited shoulder motion making it difficult to reach overhead and to the side. Pt  will continue to benefit from skilled PT to improve Right UE ROM and strength.    Pt will benefit from skilled therapeutic intervention in order to improve on the following deficits Decreased range of motion;Increased fascial restricitons;Impaired UE functional use;Increased muscle spasms;Decreased endurance;Decreased activity tolerance;Pain;Impaired flexibility;Hypomobility;Decreased scar mobility;Decreased mobility;Decreased strength;Increased edema   Rehab Potential Good   PT Frequency 2x / week   PT Duration 4 weeks   PT Treatment/Interventions ADLs/Self Care Home Management;Cryotherapy;Electrical Stimulation;Ultrasound;Moist Heat;Functional mobility training;Therapeutic activities;Therapeutic exercise;Neuromuscular re-education;Manual techniques;Patient/family education;Scar mobilization;Passive range of motion;Taping   PT Next Visit Plan Continue to improve ROM Rt shoulder with focus on supination, manual therapy; needs tactile cues to prevent shoulders from hiking   PT Home Exercise Plan continue current HEP   Consulted and Agree with Plan of Care Patient        Problem List Patient Active Problem List   Diagnosis Date Noted  . Right  supracondylar humerus fracture; right radial head/neck fracture 12/22/2014    NAUMANN-HOUEGNIFIO,Katherine Barnett PTA 06/22/2015, 9:26 AM  [facility] [address], [place], [place], [address] Phone: [phone]   Fax:  [phone]  Name: Hanley Rispoli MRN: [id number] Date of Birth: 05-31-1941

## 2015-06-24 ENCOUNTER — Ambulatory Visit: Payer: Medicare Other | Attending: Physician Assistant | Admitting: Physical Therapy

## 2015-06-24 ENCOUNTER — Encounter: Payer: Self-pay | Admitting: Physical Therapy

## 2015-06-24 DIAGNOSIS — M25521 Pain in right elbow: Secondary | ICD-10-CM | POA: Diagnosis present

## 2015-06-24 DIAGNOSIS — M25511 Pain in right shoulder: Secondary | ICD-10-CM | POA: Insufficient documentation

## 2015-06-24 DIAGNOSIS — M25631 Stiffness of right wrist, not elsewhere classified: Secondary | ICD-10-CM | POA: Insufficient documentation

## 2015-06-24 DIAGNOSIS — M25621 Stiffness of right elbow, not elsewhere classified: Secondary | ICD-10-CM | POA: Diagnosis not present

## 2015-06-24 DIAGNOSIS — M25611 Stiffness of right shoulder, not elsewhere classified: Secondary | ICD-10-CM | POA: Diagnosis present

## 2015-06-24 DIAGNOSIS — M25641 Stiffness of right hand, not elsewhere classified: Secondary | ICD-10-CM | POA: Diagnosis present

## 2015-06-24 NOTE — Therapy (Signed)
[facility] [address] [place], [place], [address] Phone: [phone]   Fax:  [phone]  Physical Therapy Treatment  Patient Details  Name: Katherine Barnett MRN: [id number] Date of Birth: 1940-11-10 Referring Provider: Dr. Merry Lofty  Encounter Date: 06/24/2015      PT End of Session - 06/24/15 0925    Visit Number 41   Number of Visits 49   Date for PT Re-Evaluation 07/23/15   PT Start Time 0845   PT Stop Time 0935   PT Time Calculation (min) 50 min   Activity Tolerance Patient tolerated treatment well   Behavior During Therapy [facility] for tasks assessed/performed      Past Medical History  Diagnosis Date  . Hypertension   . Thyroid disease     pt denies this (12/18/14)  . Diabetes mellitus without complication ([place])     type 2 - diet controlled  . Complication of anesthesia     vomited after colonoscopy    Past Surgical History  Procedure Laterality Date  . Eye surgery Bilateral     cataract surgery with lens implant  . Colonoscopy    . Orif humerus fracture Right 12/22/2014    Procedure: OPEN REDUCTION INTERNAL FIXATION (ORIF) RIGHT DISTAL HUMERUS FRACTURE;  Surgeon: Mcarthur Rossetti, MD;  Location: [facility];  Service: Orthopedics;  Laterality: Right;    There were no vitals filed for this visit.  Visit Diagnosis:  Elbow stiffness, right  Shoulder stiffness, right  Right elbow pain  Right shoulder pain      Subjective Assessment - 06/24/15 0852    Subjective I saw the doctor.  MD wants me to increase supination, increase right shoulder overhead elevation.    Pertinent History Open reduction internal fixation (ORIF)  Rt distal humerus fracture on 12/22/2014   Patient Stated Goals Patient wants to put her bra on, drive, dress, and use right arm for daily tasks   Currently in Pain? No/denies                         [facility] Adult PT Treatment/Exercise - 06/24/15 0001     Shoulder Exercises: Supine   Flexion Right;Strengthening;15 reps;Weights   Shoulder Flexion Weight (lbs) --  red plyoball   Shoulder Exercises: Seated   Flexion Right  hold at 90 degrees 30 sec 4x   Shoulder Exercises: Sidelying   ABduction Strengthening;Right;10 reps  working on correct scapula movement, keep thumb upward   Other Sidelying Exercises horizontal abduction stabilization scapula 10 x   Shoulder Exercises: Pulleys   Flexion 3 minutes   ABduction 3 minutes   Moist Heat Therapy   Number Minutes Moist Heat 10 Minutes   Moist Heat Location Elbow   Manual Therapy   Manual Therapy Passive ROM   Soft tissue mobilization right elbow with contract relax for right elbow supination                PT Education - 06/24/15 0925    Education provided No          PT Short Term Goals - 06/15/15 0852    PT SHORT TERM GOAL #1   Title independent with initital HEP for shoulder flexion and elbow ROM   Time 4   Period Weeks   Status Achieved   PT SHORT TERM GOAL #2   Title pain in right shoulder and elbow decreased >/= 25%   Time 4   Period Weeks  Status Achieved   PT SHORT TERM GOAL #3   Title right shoulder flexion PROM >/= 115 degrees   Time 4   Period Weeks   Status Achieved   PT SHORT TERM GOAL #4   Title right elbow extension >/= -5 degrees   Time 4   Period Weeks   Status On-going   PT SHORT TERM GOAL #5   Title right elbow flexion >/= 110 degrees PROM   Time 4   Period Weeks   Status Achieved           PT Long Term Goals - 06/22/15 2778    PT LONG TERM GOAL #1   Title independent with HEP and understand how to progress herself   Time 8   Period Weeks   Status On-going   PT LONG TERM GOAL #2   Title grip strength on Rt >or = to 20#   Time 6   Period Weeks   Status On-going   PT LONG TERM GOAL #3   Title demonstrate Rt shoulder AROM IR to L3 to improve self-care with Rt UE   Time 6   Period Weeks   Status On-going   PT LONG TERM  GOAL #5   Title able to cook due to right elbow AROM within functional limits   Time 6   Period Weeks   Status Partially Met               Plan - 06/24/15 0925    Clinical Impression Statement Patient is a 74 year  old female with daignosis of s/p  ORIF right supracondylar 12/23/2014 and right proximal humerus fracture.  Patient will compensate with shoulder movement by overcontracting the scapula and using scaption for abduction.  righ telbow supination is very tight and she will compenstae with shoulder movement. Patiwnt will bnefit from physical therapy to increase right shoulder overhaed strength and  elbow supination movement.    Pt will benefit from skilled therapeutic intervention in order to improve on the following deficits Decreased range of motion;Increased fascial restricitons;Impaired UE functional use;Increased muscle spasms;Decreased endurance;Decreased activity tolerance;Pain;Impaired flexibility;Hypomobility;Decreased scar mobility;Decreased mobility;Decreased strength;Increased edema   Rehab Potential Good   Clinical Impairments Affecting Rehab Potential None   PT Frequency 2x / week   PT Duration 4 weeks   PT Treatment/Interventions ADLs/Self Care Home Management;Cryotherapy;Electrical Stimulation;Ultrasound;Moist Heat;Functional mobility training;Therapeutic activities;Therapeutic exercise;Neuromuscular re-education;Manual techniques;Patient/family education;Scar mobilization;Passive range of motion;Taping   PT Next Visit Plan Continue to improve ROM Rt shoulder with focus on supination, manual therapy; needs tactile cues to prevent shoulders from hiking   PT Home Exercise Plan continue current HEP   Consulted and Agree with Plan of Care Patient        Problem List Patient Active Problem List   Diagnosis Date Noted  . Right supracondylar humerus fracture; right radial head/neck fracture 12/22/2014    Angelita Harnack,PT 06/24/2015, 9:29 AM  [facility] [address], [place], [place], [address] Phone: [phone]   Fax:  [phone]  Name: Katherine Barnett MRN: [id number] Date of Birth: Jun 21, 1941

## 2015-06-29 ENCOUNTER — Ambulatory Visit: Payer: Medicare Other | Admitting: Physical Therapy

## 2015-06-29 ENCOUNTER — Encounter: Payer: Self-pay | Admitting: Physical Therapy

## 2015-06-29 DIAGNOSIS — M25621 Stiffness of right elbow, not elsewhere classified: Secondary | ICD-10-CM | POA: Diagnosis not present

## 2015-06-29 DIAGNOSIS — M25521 Pain in right elbow: Secondary | ICD-10-CM

## 2015-06-29 DIAGNOSIS — M25611 Stiffness of right shoulder, not elsewhere classified: Secondary | ICD-10-CM

## 2015-06-29 DIAGNOSIS — M25511 Pain in right shoulder: Secondary | ICD-10-CM

## 2015-06-29 NOTE — Therapy (Signed)
[facility] [address], [place], [place], [address] Phone: [phone]   Fax:  [phone]  Physical Therapy Treatment  Patient Details  Name: Katherine Barnett MRN: [id number] Date of Birth: 1941-04-12 Referring Provider: Dr. Merry Lofty  Encounter Date: 06/29/2015      PT End of Session - 06/29/15 1036    Visit Number 42   Number of Visits 49   Date for PT Re-Evaluation 07/23/15   PT Start Time 7867   PT Stop Time 1115   PT Time Calculation (min) 60 min   Activity Tolerance Patient tolerated treatment well   Behavior During Therapy [facility] for tasks assessed/performed      Past Medical History  Diagnosis Date  . Hypertension   . Thyroid disease     pt denies this (12/18/14)  . Diabetes mellitus without complication ([place])     type 2 - diet controlled  . Complication of anesthesia     vomited after colonoscopy    Past Surgical History  Procedure Laterality Date  . Eye surgery Bilateral     cataract surgery with lens implant  . Colonoscopy    . Orif humerus fracture Right 12/22/2014    Procedure: OPEN REDUCTION INTERNAL FIXATION (ORIF) RIGHT DISTAL HUMERUS FRACTURE;  Surgeon: Mcarthur Rossetti, MD;  Location: [place];  Service: Orthopedics;  Laterality: Right;    There were no vitals filed for this visit.  Visit Diagnosis:  Elbow stiffness, right  Shoulder stiffness, right  Right elbow pain  Right shoulder pain      Subjective Assessment - 06/29/15 1029    Subjective Pt was driving herself today to therapy. Pt reports compliance with HEP   Currently in Pain? No/denies  only with exercises up to 5-7/10   Pain Location Shoulder                         [facility] Adult PT Treatment/Exercise - 06/29/15 0001    Shoulder Exercises: Supine   Flexion Right;Strengthening;15 reps;Weights  red physioball up the wall x 10   Shoulder Exercises: Seated   Flexion Right  hold at 90  degrees for 10 sec x 5   Shoulder Exercises: Sidelying   ABduction Strengthening;Right;10 reps  keep thump up   Shoulder Exercises: Standing   Flexion 10 reps;AAROM  on doorframe for stetching    ABduction AAROM;10 reps  difficult needs tactile and verbal cues   Other Standing Exercises wall ladder 2x 5 to peg #10   flexion   Other Standing Exercises chickenwings: bil elev/depression into ABD 2 x 10   Shoulder Exercises: Pulleys   Flexion 3 minutes   ABduction 3 minutes  in standing   Moist Heat Therapy   Number Minutes Moist Heat 10 Minutes   Moist Heat Location Elbow   Manual Therapy   Manual Therapy Passive ROM   Soft tissue mobilization right elbow with contract relax for right elbow supination   Passive ROM teres major, subscapularis and lattisimus release                  PT Short Term Goals - 06/29/15 1045    PT SHORT TERM GOAL #1   Title independent with initital HEP for shoulder flexion and elbow ROM   Time 4   Period Weeks   Status Achieved   PT SHORT TERM GOAL #2   Title pain in right shoulder and elbow decreased >/= 25%   Time  4   Period Weeks   Status Achieved   PT SHORT TERM GOAL #3   Title right shoulder flexion PROM >/= 115 degrees   Time 4   Period Weeks   Status Achieved   PT SHORT TERM GOAL #4   Title right elbow extension >/= -5 degrees   Time 4   Period Weeks   Status On-going   PT SHORT TERM GOAL #5   Title right elbow flexion >/= 110 degrees PROM   Time 4   Period Weeks   Status Achieved           PT Long Term Goals - 06/22/15 5993    PT LONG TERM GOAL #1   Title independent with HEP and understand how to progress herself   Time 8   Period Weeks   Status On-going   PT LONG TERM GOAL #2   Title grip strength on Rt >or = to 20#   Time 6   Period Weeks   Status On-going   PT LONG TERM GOAL #3   Title demonstrate Rt shoulder AROM IR to L3 to improve self-care with Rt UE   Time 6   Period Weeks   Status On-going    PT LONG TERM GOAL #5   Title able to cook due to right elbow AROM within functional limits   Time 6   Period Weeks   Status Partially Met               Plan - 06/29/15 1037    Clinical Impression Statement Patient with improvement of shoulder and elbow movement since start of care, nevertheless still limited in ROM Rt shoulder and elbow, using scaption to compensate for movement into abduction and using shoulder movement to compensate for limited supination. Patient will benefit from skilled Pt to incr ROM Rt shoulder and elbow. .    Rehab Potential Good   Clinical Impairments Affecting Rehab Potential Katherine Barnett is a 74 y.o. female with diagnosis s/p ORIF right supracondylar 12/23/2014 and right humerus fracture.   PT Frequency 2x / week   PT Duration 4 weeks   PT Treatment/Interventions ADLs/Self Care Home Management;Cryotherapy;Electrical Stimulation;Ultrasound;Moist Heat;Functional mobility training;Therapeutic activities;Therapeutic exercise;Neuromuscular re-education;Manual techniques;Patient/family education;Scar mobilization;Passive range of motion;Taping   PT Next Visit Plan Continue to improve ROM Rt shoulder and Rt elbow supination, manual therapy; needs tactile cues to prevent shoulders from hiking   Consulted and Agree with Plan of Care Patient        Problem List Patient Active Problem List   Diagnosis Date Noted  . Right supracondylar humerus fracture; right radial head/neck fracture 12/22/2014    NAUMANN-HOUEGNIFIO,Shirah Roseman PTA 06/29/2015, 11:08 AM  [facility] [address], [place], [place], [address] Phone: [phone]   Fax:  [phone]  Name: Katherine Barnett MRN: [id number] Date of Birth: [birth year]

## 2015-07-01 ENCOUNTER — Encounter: Payer: Self-pay | Admitting: Physical Therapy

## 2015-07-01 ENCOUNTER — Ambulatory Visit: Payer: Medicare Other | Admitting: Physical Therapy

## 2015-07-01 DIAGNOSIS — M25611 Stiffness of right shoulder, not elsewhere classified: Secondary | ICD-10-CM

## 2015-07-01 DIAGNOSIS — M25521 Pain in right elbow: Secondary | ICD-10-CM

## 2015-07-01 DIAGNOSIS — M25631 Stiffness of right wrist, not elsewhere classified: Secondary | ICD-10-CM

## 2015-07-01 DIAGNOSIS — M25621 Stiffness of right elbow, not elsewhere classified: Secondary | ICD-10-CM

## 2015-07-01 DIAGNOSIS — M25511 Pain in right shoulder: Secondary | ICD-10-CM

## 2015-07-01 DIAGNOSIS — M25641 Stiffness of right hand, not elsewhere classified: Secondary | ICD-10-CM

## 2015-07-01 NOTE — Therapy (Signed)
[facility] [address] [place], [place], [address] Phone: [phone]   Fax:  [phone]  Physical Therapy Treatment  Patient Details  Name: Katherine Barnett MRN: [id number] Date of Birth: [birth year] Referring Provider: Dr. Merry Lofty  Encounter Date: 07/01/2015      PT End of Session - 07/01/15 0910    Visit Number 43   Number of Visits 49   Date for PT Re-Evaluation 07/23/15   PT Start Time 0844   PT Stop Time 0944   PT Time Calculation (min) 60 min   Activity Tolerance Patient tolerated treatment well   Behavior During Therapy [facility] for tasks assessed/performed      Past Medical History  Diagnosis Date  . Hypertension   . Thyroid disease     pt denies this (12/18/14)  . Diabetes mellitus without complication ([place])     type 2 - diet controlled  . Complication of anesthesia     vomited after colonoscopy    Past Surgical History  Procedure Laterality Date  . Eye surgery Bilateral     cataract surgery with lens implant  . Colonoscopy    . Orif humerus fracture Right 12/22/2014    Procedure: OPEN REDUCTION INTERNAL FIXATION (ORIF) RIGHT DISTAL HUMERUS FRACTURE;  Surgeon: Mcarthur Rossetti, MD;  Location: [place];  Service: Orthopedics;  Laterality: Right;    There were no vitals filed for this visit.  Visit Diagnosis:  Elbow stiffness, right  Shoulder stiffness, right  Right elbow pain  Right shoulder pain  Wrist stiffness, right  Stiffness of right hand joint      Subjective Assessment - 07/01/15 0903    Subjective Pt reports feeling improvement with her reaching into kitchencabinets now able to reach 2nd shelf. No complains of pain   Currently in Pain? No/denies                         [facility] Adult PT Treatment/Exercise - 07/01/15 0001    Shoulder Exercises: Supine   Flexion Right;Strengthening   Theraband Level (Shoulder Flexion) --  red physioball up the  wall   Shoulder Exercises: Sidelying   ABduction Strengthening;Right;10 reps  keep thump up   Shoulder Exercises: Standing   Flexion 10 reps;AAROM  on doorframe for stretching   ABduction AAROM;10 reps  challenging due to muscular restrictions   Other Standing Exercises wall ladder 2x 5 to peg #10    Other Standing Exercises chickenwings: bil elev/depression into ABD 2 x 10   Shoulder Exercises: Pulleys   Flexion 3 minutes   ABduction 3 minutes  in standing   Moist Heat Therapy   Number Minutes Moist Heat 10 Minutes   Moist Heat Location Shoulder  positioned on hi/lo table for prolonged stretch   Manual Therapy   Manual Therapy Passive ROM   Soft tissue mobilization right elbow with contract relax for right elbow supination   Passive ROM teres major, subscapularis and lattisimus release                  PT Short Term Goals - 06/29/15 1045    PT SHORT TERM GOAL #1   Title independent with initital HEP for shoulder flexion and elbow ROM   Time 4   Period Weeks   Status Achieved   PT SHORT TERM GOAL #2   Title pain in right shoulder and elbow decreased >/= 25%   Time 4   Period Weeks  Status Achieved   PT SHORT TERM GOAL #3   Title right shoulder flexion PROM >/= 115 degrees   Time 4   Period Weeks   Status Achieved   PT SHORT TERM GOAL #4   Title right elbow extension >/= -5 degrees   Time 4   Period Weeks   Status On-going   PT SHORT TERM GOAL #5   Title right elbow flexion >/= 110 degrees PROM   Time 4   Period Weeks   Status Achieved           PT Long Term Goals - 06/22/15 2072    PT LONG TERM GOAL #1   Title independent with HEP and understand how to progress herself   Time 8   Period Weeks   Status On-going   PT LONG TERM GOAL #2   Title grip strength on Rt >or = to 20#   Time 6   Period Weeks   Status On-going   PT LONG TERM GOAL #3   Title demonstrate Rt shoulder AROM IR to L3 to improve self-care with Rt UE   Time 6   Period  Weeks   Status On-going   PT LONG TERM GOAL #5   Title able to cook due to right elbow AROM within functional limits   Time 6   Period Weeks   Status Partially Met               Plan - 07/01/15 0912    Clinical Impression Statement Pt continues to improve with shoulder movement and elbow, AROM in degrees Rt shoulder Flexion 113, Abduction 77. Pt will continue to benefit from skilled PT to improve ROM Rt shoulder and elbow.    Pt will benefit from skilled therapeutic intervention in order to improve on the following deficits Decreased range of motion;Increased fascial restricitons;Impaired UE functional use;Increased muscle spasms;Decreased endurance;Decreased activity tolerance;Pain;Impaired flexibility;Hypomobility;Decreased scar mobility;Decreased mobility;Decreased strength;Increased edema   Rehab Potential Good   Clinical Impairments Affecting Rehab Potential patient is a 74 y.o. female with diagnosis s/p ORIF right supracondylar 12/23/2014 and right humerus fracture.   PT Frequency 2x / week   PT Duration 4 weeks   PT Treatment/Interventions ADLs/Self Care Home Management;Cryotherapy;Electrical Stimulation;Ultrasound;Moist Heat;Functional mobility training;Therapeutic activities;Therapeutic exercise;Neuromuscular re-education;Manual techniques;Patient/family education;Scar mobilization;Passive range of motion;Taping   PT Next Visit Plan Continue to improve ROM Rt shoulder and Rt elbow supination, manual therapy; needs tactile cues to prevent shoulders from hiking   PT Home Exercise Plan continue current HEP   Consulted and Agree with Plan of Care Patient        Problem List Patient Active Problem List   Diagnosis Date Noted  . Right supracondylar humerus fracture; right radial head/neck fracture 12/22/2014    NAUMANN-HOUEGNIFIO,Daylah Sayavong PTA 07/01/2015, 9:33 AM  [facility] [address] [place], [place],  [address] Phone: [phone]   Fax:  [phone]  Name: Brailey Buescher MRN: [id number] Date of Birth: 08-08-1940

## 2015-07-01 NOTE — Therapy (Signed)
[facility] [address] [place], [place], [address] Phone: [phone]   Fax:  [phone]  Physical Therapy Treatment  Patient Details  Name: Katherine Barnett MRN: [id number] Date of Birth: 11/28/1940 Referring Provider: Dr. Merry Lofty  Encounter Date: 07/01/2015      PT End of Session - 07/01/15 0910    Visit Number 43   Number of Visits 49   Date for PT Re-Evaluation 07/23/15   PT Start Time 0844   PT Stop Time 0944   PT Time Calculation (min) 60 min   Activity Tolerance Patient tolerated treatment well   Behavior During Therapy [facility] for tasks assessed/performed      Past Medical History  Diagnosis Date  . Hypertension   . Thyroid disease     pt denies this (12/18/14)  . Diabetes mellitus without complication ([place])     type 2 - diet controlled  . Complication of anesthesia     vomited after colonoscopy    Past Surgical History  Procedure Laterality Date  . Eye surgery Bilateral     cataract surgery with lens implant  . Colonoscopy    . Orif humerus fracture Right 12/22/2014    Procedure: OPEN REDUCTION INTERNAL FIXATION (ORIF) RIGHT DISTAL HUMERUS FRACTURE;  Surgeon: Mcarthur Rossetti, MD;  Location: [place];  Service: Orthopedics;  Laterality: Right;    There were no vitals filed for this visit.  Visit Diagnosis:  Elbow stiffness, right  Shoulder stiffness, right  Right elbow pain  Right shoulder pain  Wrist stiffness, right  Stiffness of right hand joint      Subjective Assessment - 07/01/15 0903    Subjective Pt reports feeling improvement with her reaching into kitchencabinets now able to reach 2nd shelf. No complains of pain   Currently in Pain? No/denies                         [facility] Adult PT Treatment/Exercise - 07/01/15 0001    Shoulder Exercises: Supine   Flexion Right;Strengthening   Theraband Level (Shoulder Flexion) --  red physioball up the  wall   Shoulder Exercises: Sidelying   ABduction Strengthening;Right;10 reps  keep thump up   Shoulder Exercises: Standing   Flexion 10 reps;AAROM  on doorframe for stretching   ABduction AAROM;10 reps  challenging due to muscular restrictions   Other Standing Exercises wall ladder 2x 5 to peg #10    Other Standing Exercises chickenwings: bil elev/depression into ABD 2 x 10   Shoulder Exercises: Pulleys   Flexion 3 minutes   ABduction 3 minutes  in standing   Moist Heat Therapy   Number Minutes Moist Heat 10 Minutes   Moist Heat Location Shoulder  positioned on hi/lo table for prolonged stretch   Manual Therapy   Manual Therapy Passive ROM   Soft tissue mobilization right elbow with contract relax for right elbow supination   Passive ROM teres major, subscapularis and lattisimus release                  PT Short Term Goals - 06/29/15 1045    PT SHORT TERM GOAL #1   Title independent with initital HEP for shoulder flexion and elbow ROM   Time 4   Period Weeks   Status Achieved   PT SHORT TERM GOAL #2   Title pain in right shoulder and elbow decreased >/= 25%   Time 4   Period Weeks  Status Achieved   PT SHORT TERM GOAL #3   Title right shoulder flexion PROM >/= 115 degrees   Time 4   Period Weeks   Status Achieved   PT SHORT TERM GOAL #4   Title right elbow extension >/= -5 degrees   Time 4   Period Weeks   Status On-going   PT SHORT TERM GOAL #5   Title right elbow flexion >/= 110 degrees PROM   Time 4   Period Weeks   Status Achieved           PT Long Term Goals - 06/22/15 1587    PT LONG TERM GOAL #1   Title independent with HEP and understand how to progress herself   Time 8   Period Weeks   Status On-going   PT LONG TERM GOAL #2   Title grip strength on Rt >or = to 20#   Time 6   Period Weeks   Status On-going   PT LONG TERM GOAL #3   Title demonstrate Rt shoulder AROM IR to L3 to improve self-care with Rt UE   Time 6   Period  Weeks   Status On-going   PT LONG TERM GOAL #5   Title able to cook due to right elbow AROM within functional limits   Time 6   Period Weeks   Status Partially Met               Plan - 07/01/15 0912    Clinical Impression Statement Pt continues to improve with shoulder movement and elbow, AROM in degrees Rt shoulder Flexion 113, Abduction 77. Pt will continue to benefit from skilled PT to improve ROM Rt shoulder and elbow.    Pt will benefit from skilled therapeutic intervention in order to improve on the following deficits Decreased range of motion;Increased fascial restricitons;Impaired UE functional use;Increased muscle spasms;Decreased endurance;Decreased activity tolerance;Pain;Impaired flexibility;Hypomobility;Decreased scar mobility;Decreased mobility;Decreased strength;Increased edema   Rehab Potential Good   Clinical Impairments Affecting Rehab Potential patient is a 74 y.o. female with diagnosis s/p ORIF right supracondylar 12/23/2014 and right humerus fracture.   PT Frequency 2x / week   PT Duration 4 weeks   PT Treatment/Interventions ADLs/Self Care Home Management;Cryotherapy;Electrical Stimulation;Ultrasound;Moist Heat;Functional mobility training;Therapeutic activities;Therapeutic exercise;Neuromuscular re-education;Manual techniques;Patient/family education;Scar mobilization;Passive range of motion;Taping   PT Next Visit Plan Continue to improve ROM Rt shoulder and Rt elbow supination, manual therapy; needs tactile cues to prevent shoulders from hiking   PT Home Exercise Plan continue current HEP   Consulted and Agree with Plan of Care Patient        Problem List Patient Active Problem List   Diagnosis Date Noted  . Right supracondylar humerus fracture; right radial head/neck fracture 12/22/2014    NAUMANN-HOUEGNIFIO,Katherine Barnett PTA 07/01/2015, 9:36 AM  [facility] [address] [place], [place],  [address] Phone: [phone]   Fax:  [phone]  Name: Katherine Barnett MRN: [id number] Date of Birth: 11-Jun-1941

## 2015-07-07 ENCOUNTER — Encounter: Payer: Self-pay | Admitting: Physical Therapy

## 2015-07-07 ENCOUNTER — Ambulatory Visit: Payer: Medicare Other | Admitting: Physical Therapy

## 2015-07-07 DIAGNOSIS — M25611 Stiffness of right shoulder, not elsewhere classified: Secondary | ICD-10-CM

## 2015-07-07 DIAGNOSIS — M25621 Stiffness of right elbow, not elsewhere classified: Secondary | ICD-10-CM

## 2015-07-07 DIAGNOSIS — M25521 Pain in right elbow: Secondary | ICD-10-CM

## 2015-07-07 DIAGNOSIS — M25631 Stiffness of right wrist, not elsewhere classified: Secondary | ICD-10-CM

## 2015-07-07 DIAGNOSIS — M25511 Pain in right shoulder: Secondary | ICD-10-CM

## 2015-07-07 NOTE — Therapy (Signed)
[facility] [address] [place], [place], [address] Phone: [phone]   Fax:  [phone]  Physical Therapy Treatment  Patient Details  Name: Katherine Barnett MRN: [id number] Date of Birth: 1941-03-23 Referring Provider: Dr. Merry Lofty  Encounter Date: 07/07/2015      PT End of Session - 07/07/15 0927    Visit Number 67   Number of Visits 49   Date for PT Re-Evaluation 07/23/15   PT Start Time 0844   PT Stop Time 0945   PT Time Calculation (min) 61 min   Activity Tolerance Patient tolerated treatment well   Behavior During Therapy [facility] for tasks assessed/performed      Past Medical History  Diagnosis Date  . Hypertension   . Thyroid disease     pt denies this (12/18/14)  . Diabetes mellitus without complication ([place])     type 2 - diet controlled  . Complication of anesthesia     vomited after colonoscopy    Past Surgical History  Procedure Laterality Date  . Eye surgery Bilateral     cataract surgery with lens implant  . Colonoscopy    . Orif humerus fracture Right 12/22/2014    Procedure: OPEN REDUCTION INTERNAL FIXATION (ORIF) RIGHT DISTAL HUMERUS FRACTURE;  Surgeon: Mcarthur Rossetti, MD;  Location: [place];  Service: Orthopedics;  Laterality: Right;    There were no vitals filed for this visit.  Visit Diagnosis:  Elbow stiffness, right  Shoulder stiffness, right  Right elbow pain  Right shoulder pain  Wrist stiffness, right      Subjective Assessment - 07/07/15 0909    Subjective Pt reports cutting with scissors is getting much easier and cutting with the knife is improving, flipping over a piece of chicken is easier. No complains of pain   Pertinent History Open reduction internal fixation (ORIF)  Rt distal humerus fracture on 12/22/2014   Patient Stated Goals Patient wants to put her bra on, drive, dress, and use right arm for daily tasks   Currently in Pain? No/denies    Multiple Pain Sites No            [facility] PT Assessment - 07/07/15 0001    AROM   AROM Assessment Site Shoulder   Right Shoulder Extension 50 Degrees   Right Shoulder Flexion 105 Degrees   Right Shoulder ABduction 85 Degrees                     [facility] Adult PT Treatment/Exercise - 07/07/15 0001    Shoulder Exercises: Supine   Flexion Right;Strengthening   Theraband Level (Shoulder Flexion) --  red physioball up the wall   Shoulder Exercises: Sidelying   ABduction Strengthening;Right;10 reps  keep thump up   Shoulder Exercises: Standing   Flexion 10 reps;AAROM  on doorframe for stretching   ABduction AAROM;10 reps  challenging, but improving   Other Standing Exercises wall ladder 2x 5 to peg #10    Other Standing Exercises chickenwings: bil elev/depression into ABD 2 x 10  practiced in front of [place], tactile and verbal cues   Shoulder Exercises: Pulleys   Flexion 3 minutes   ABduction 3 minutes  in standing   Shoulder Exercises: ROM/Strengthening   UBE (Upper Arm Bike) Level 1 80mn(4/4), pt with compensatory movement of shoulder hikking, but improving   Moist Heat Therapy   Number Minutes Moist Heat 10 Minutes   Moist Heat Location Shoulder  positioned on hi/lo table for  prolonged stretch   Manual Therapy   Manual Therapy Passive ROM   Soft tissue mobilization right elbow with contract relax for right elbow supination   Passive ROM teres major, subscapularis and lattisimus release                  PT Short Term Goals - 06/29/15 1045    PT SHORT TERM GOAL #1   Title independent with initital HEP for shoulder flexion and elbow ROM   Time 4   Period Weeks   Status Achieved   PT SHORT TERM GOAL #2   Title pain in right shoulder and elbow decreased >/= 25%   Time 4   Period Weeks   Status Achieved   PT SHORT TERM GOAL #3   Title right shoulder flexion PROM >/= 115 degrees   Time 4   Period Weeks   Status Achieved   PT SHORT TERM GOAL #4    Title right elbow extension >/= -5 degrees   Time 4   Period Weeks   Status On-going   PT SHORT TERM GOAL #5   Title right elbow flexion >/= 110 degrees PROM   Time 4   Period Weeks   Status Achieved           PT Long Term Goals - 07/07/15 0931    PT LONG TERM GOAL #1   Title independent with HEP and understand how to progress herself   Time 8   Period Weeks   Status On-going   PT LONG TERM GOAL #2   Title grip strength on Rt >or = to 20#   Time 6   Period Weeks   Status On-going   PT LONG TERM GOAL #3   Title demonstrate Rt shoulder AROM IR to L3 to improve self-care with Rt UE   Time 6   Period Weeks   Status On-going   PT LONG TERM GOAL #4   Title return to driving due to right shoulder strength 4/5   Time 6   Period Weeks   Status Partially Met   PT LONG TERM GOAL #5   Title able to cook due to right elbow AROM within functional limits   Time 6   Period Weeks   Status Partially Met               Plan - 07/07/15 0927    Clinical Impression Statement Pt continues to improve in all areas of movement. AROM in Rt shoulder in degrees, flexion 113, Abduction 80. pt will continue to benefit from skilled PT to improve ROM Rt shoulder and elbow.    Pt will benefit from skilled therapeutic intervention in order to improve on the following deficits Decreased range of motion;Increased fascial restricitons;Impaired UE functional use;Increased muscle spasms;Decreased endurance;Decreased activity tolerance;Pain;Impaired flexibility;Hypomobility;Decreased scar mobility;Decreased mobility;Decreased strength;Increased edema   Rehab Potential Good   Clinical Impairments Affecting Rehab Potential patient is a 74 y.o. female with diagnosis s/p ORIF right supracondylar 12/23/2014 and right humerus fracture.   PT Frequency 2x / week   PT Duration 4 weeks   PT Treatment/Interventions ADLs/Self Care Home Management;Cryotherapy;Electrical Stimulation;Ultrasound;Moist  Heat;Functional mobility training;Therapeutic activities;Therapeutic exercise;Neuromuscular re-education;Manual techniques;Patient/family education;Scar mobilization;Passive range of motion;Taping   PT Next Visit Plan Take measurment for Rt elbow. Continue to improve ROM Rt shoulder and Rt elbow supination, manual therapy; needs tactile cues to prevent shoulders from hiking   PT Home Exercise Plan continue current HEP   Recommended Other Services none   Consulted  and Agree with Plan of Care Patient        Problem List Patient Active Problem List   Diagnosis Date Noted  . Right supracondylar humerus fracture; right radial head/neck fracture 12/22/2014    NAUMANN-HOUEGNIFIO,Iyad Deroo PTA 07/07/2015, 9:42 AM  [facility] [address], [place], [place], [address] Phone: [phone]   Fax:  [phone]  Name: Katherine Barnett MRN: [id number] Date of Birth: 01-02-1941

## 2015-07-08 ENCOUNTER — Ambulatory Visit: Payer: Medicare Other | Admitting: Physical Therapy

## 2015-07-08 DIAGNOSIS — M25641 Stiffness of right hand, not elsewhere classified: Secondary | ICD-10-CM

## 2015-07-08 DIAGNOSIS — M25621 Stiffness of right elbow, not elsewhere classified: Secondary | ICD-10-CM | POA: Diagnosis not present

## 2015-07-08 DIAGNOSIS — M25511 Pain in right shoulder: Secondary | ICD-10-CM

## 2015-07-08 DIAGNOSIS — M25631 Stiffness of right wrist, not elsewhere classified: Secondary | ICD-10-CM

## 2015-07-08 DIAGNOSIS — M25611 Stiffness of right shoulder, not elsewhere classified: Secondary | ICD-10-CM

## 2015-07-08 DIAGNOSIS — M25521 Pain in right elbow: Secondary | ICD-10-CM

## 2015-07-08 NOTE — Therapy (Signed)
[facility] [address], [address] [place], [place], [address] Phone: [phone]   Fax:  [phone]  Physical Therapy Treatment  Patient Details  Name: Katherine Barnett MRN: [id number] Date of Birth: 09/28/1940 Referring Provider: Dr. Christian Blackman  Encounter Date: 07/08/2015      PT End of Session - 07/08/15 0929    Visit Number 45   Number of Visits 49   Date for PT Re-Evaluation 07/23/15   PT Start Time 0845   PT Stop Time 0940   PT Time Calculation (min) 55 min   Activity Tolerance Patient tolerated treatment well   Behavior During Therapy WFL for tasks assessed/performed      Past Medical History  Diagnosis Date  . Hypertension   . Thyroid disease     pt denies this (12/18/14)  . Diabetes mellitus without complication (HCC)     type 2 - diet controlled  . Complication of anesthesia     vomited after colonoscopy    Past Surgical History  Procedure Laterality Date  . Eye surgery Bilateral     cataract surgery with lens implant  . Colonoscopy    . Orif humerus fracture Right 12/22/2014    Procedure: OPEN REDUCTION INTERNAL FIXATION (ORIF) RIGHT DISTAL HUMERUS FRACTURE;  Surgeon: Christopher Y Blackman, MD;  Location: [facility];  Service: Orthopedics;  Laterality: Right;    There were no vitals filed for this visit.  Visit Diagnosis:  Elbow stiffness, right  Shoulder stiffness, right  Right elbow pain  Right shoulder pain  Wrist stiffness, right  Stiffness of right hand joint      Subjective Assessment - 07/08/15 0851    Subjective Pt reports she feels she is improving a lot, no pain   Pertinent History Open reduction internal fixation (ORIF)  Rt distal humerus fracture on 12/22/2014   Patient Stated Goals Patient wants to put her bra on, drive, dress, and use right arm for daily tasks   Currently in Pain? No/denies            [facility] PT Assessment - 07/08/15 0001    AROM   Right/Left Elbow  Right   Right Elbow Flexion 90   Right Elbow Extension -10   Right Forearm Supination 20 Degrees                     [facility] Adult PT Treatment/Exercise - 07/08/15 0001    Shoulder Exercises: Sidelying   ABduction Strengthening;Right;10 reps  thumb up   Shoulder Exercises: Standing   Flexion AAROM;10 reps  with towel on doorframe   ABduction AAROM;10 reps  challenging   Other Standing Exercises wall ladder to #10 2 x 5   Other Standing Exercises chicken wings bilat elev/deression into abd 2 x 5  tactile and visual cues   Shoulder Exercises: Pulleys   Flexion 3 minutes   ABduction 3 minutes  standing   Moist Heat Therapy   Number Minutes Moist Heat 10 Minutes   Moist Heat Location Shoulder  sitting with shoulder abd at hi lo table for stretching   Manual Therapy   Manual Therapy Soft tissue mobilization;Passive ROM   Soft tissue mobilization teres major, subscap, lats, wrist extensors   Passive ROM shoulder flex, abd, elbow supination                PT Education - 07/08/15 0929    Education provided Yes   Education Details use hammer to stretch into supination     Person(s) Educated Patient   Methods Explanation;Demonstration   Comprehension Verbalized understanding          PT Short Term Goals - 07/08/15 0933    PT SHORT TERM GOAL #1   Title independent with initital HEP for shoulder flexion and elbow ROM   Status Achieved   PT SHORT TERM GOAL #2   Title pain in right shoulder and elbow decreased >/= 25%   Status Achieved   PT SHORT TERM GOAL #3   Title right shoulder flexion PROM >/= 115 degrees   Status Achieved   PT SHORT TERM GOAL #4   Title right elbow extension >/= -5 degrees   Time 4   Period Weeks   Status On-going   PT SHORT TERM GOAL #5   Title right elbow flexion >/= 110 degrees PROM   Time 4   Period Weeks   Status Achieved           PT Long Term Goals - 07/08/15 0933    PT LONG TERM GOAL #1   Title independent with  HEP and understand how to progress herself   Time 8   Period Weeks   Status On-going   PT LONG TERM GOAL #2   Title grip strength on Rt >or = to 20#   Time 6   Period Weeks   Status On-going   PT LONG TERM GOAL #3   Title demonstrate Rt shoulder AROM IR to L3 to improve self-care with Rt UE   Time 6   Period Weeks   Status On-going   PT LONG TERM GOAL #4   Title return to driving due to right shoulder strength 4/5   Time 6   Period Weeks   Status Partially Met   PT LONG TERM GOAL #5   Title able to cook due to right elbow AROM within functional limits   Time 6   Period Weeks   Status Partially Met               Plan - 07/08/15 0931    Clinical Impression Statement Pt with more pain with stretching into flexion and abduction today, some reps decreased. Pt elbow extension improved, flexion decreased since last measurement.  Pt improved sidelying shoulder abduction strength.  Pt will continue to benefit from skilled PT   Pt will benefit from skilled therapeutic intervention in order to improve on the following deficits Decreased range of motion;Increased fascial restricitons;Impaired UE functional use;Increased muscle spasms;Decreased endurance;Decreased activity tolerance;Pain;Impaired flexibility;Hypomobility;Decreased scar mobility;Decreased mobility;Decreased strength;Increased edema   Rehab Potential Good   Clinical Impairments Affecting Rehab Potential patient is a 74 y.o. female with diagnosis s/p ORIF right supracondylar 12/23/2014 and right humerus fracture.   PT Frequency 2x / week   PT Duration 4 weeks   PT Treatment/Interventions ADLs/Self Care Home Management;Cryotherapy;Electrical Stimulation;Ultrasound;Moist Heat;Functional mobility training;Therapeutic activities;Therapeutic exercise;Neuromuscular re-education;Manual techniques;Patient/family education;Scar mobilization;Passive range of motion;Taping   PT Next Visit Plan continue ROM Rt shoulder and elbow, manual  therapy and strengthening with cuing to prevent shoulder hiking   PT Home Exercise Plan supination stretch with hammer   Consulted and Agree with Plan of Care Patient        Problem List Patient Active Problem List   Diagnosis Date Noted  . Right supracondylar humerus fracture; right radial head/neck fracture 12/22/2014    Karen Donawerth, PT, DPT  07/08/2015, 9:37 AM  [facility] [address], [address] [place], [place], [address] Phone: [phone]     Fax:  [phone]  Name: Dajia Gunnels MRN: [id number] Date of Birth: 06-22-1941

## 2015-07-13 ENCOUNTER — Ambulatory Visit: Payer: Medicare Other | Admitting: Physical Therapy

## 2015-07-13 ENCOUNTER — Encounter: Payer: Self-pay | Admitting: Physical Therapy

## 2015-07-13 DIAGNOSIS — M25631 Stiffness of right wrist, not elsewhere classified: Secondary | ICD-10-CM

## 2015-07-13 DIAGNOSIS — M25511 Pain in right shoulder: Secondary | ICD-10-CM

## 2015-07-13 DIAGNOSIS — M25621 Stiffness of right elbow, not elsewhere classified: Secondary | ICD-10-CM

## 2015-07-13 DIAGNOSIS — M25521 Pain in right elbow: Secondary | ICD-10-CM

## 2015-07-13 DIAGNOSIS — M25611 Stiffness of right shoulder, not elsewhere classified: Secondary | ICD-10-CM

## 2015-07-13 NOTE — Therapy (Signed)
[facility] [address], [place], [place], [address] Phone: [phone]   Fax:  [phone]  Physical Therapy Treatment  Patient Details  Name: Katherine Barnett MRN: [id number] Date of Birth: 01/17/1941 Referring Provider: Dr. Merry Lofty  Encounter Date: 07/13/2015      PT End of Session - 07/13/15 0926    Visit Number 39   Number of Visits 49   Date for PT Re-Evaluation 07/23/15   PT Start Time 0845   PT Stop Time 0943   PT Time Calculation (min) 58 min   Activity Tolerance Patient tolerated treatment well   Behavior During Therapy [facility] for tasks assessed/performed      Past Medical History  Diagnosis Date  . Hypertension   . Thyroid disease     pt denies this (12/18/14)  . Diabetes mellitus without complication ([place])     type 2 - diet controlled  . Complication of anesthesia     vomited after colonoscopy    Past Surgical History  Procedure Laterality Date  . Eye surgery Bilateral     cataract surgery with lens implant  . Colonoscopy    . Orif humerus fracture Right 12/22/2014    Procedure: OPEN REDUCTION INTERNAL FIXATION (ORIF) RIGHT DISTAL HUMERUS FRACTURE;  Surgeon: Mcarthur Rossetti, MD;  Location: [facility];  Service: Orthopedics;  Laterality: Right;    There were no vitals filed for this visit.  Visit Diagnosis:  Elbow stiffness, right  Shoulder stiffness, right  Right elbow pain  Right shoulder pain  Wrist stiffness, right      Subjective Assessment - 07/13/15 0858    Subjective No complain of pain, pt reports has more confidence with driving. All over daily activities are less "clampse" and endurance for task improves   Pertinent History Open reduction internal fixation (ORIF)  Rt distal humerus fracture on 12/22/2014   Patient Stated Goals Patient wants to put her bra on, drive, dress, and use right arm for daily tasks   Currently in Pain? No/denies                          [facility] Adult PT Treatment/Exercise - 07/13/15 0001    Shoulder Exercises: Sidelying   ABduction Strengthening;Right;10 reps  2 x 10, thumb up, scapula fixated by PTA   Shoulder Exercises: Standing   Flexion AAROM  on doorframe for stretching   ABduction AAROM;10 reps  challenging   Other Standing Exercises chicken wings bilat elev/deression into abd 2 x 5   Shoulder Exercises: Pulleys   Flexion 3 minutes;2 minutes   ABduction 3 minutes   Shoulder Exercises: ROM/Strengthening   UBE (Upper Arm Bike) Level 1 71mn(3/3), pt with compensatory movement of shoulder hikking, but improving   Moist Heat Therapy   Number Minutes Moist Heat 10 Minutes   Moist Heat Location Shoulder  with shoulder    Manual Therapy   Manual Therapy Soft tissue mobilization;Passive ROM   Soft tissue mobilization teres major, subscap, lats, wrist extensors   Passive ROM shoulder flex, abd, elbow supination                  PT Short Term Goals - 07/08/15 0933    PT SHORT TERM GOAL #1   Title independent with initital HEP for shoulder flexion and elbow ROM   Status Achieved   PT SHORT TERM GOAL #2   Title pain in right shoulder and elbow decreased >/= 25%  Status Achieved   PT SHORT TERM GOAL #3   Title right shoulder flexion PROM >/= 115 degrees   Status Achieved   PT SHORT TERM GOAL #4   Title right elbow extension >/= -5 degrees   Time 4   Period Weeks   Status On-going   PT SHORT TERM GOAL #5   Title right elbow flexion >/= 110 degrees PROM   Time 4   Period Weeks   Status Achieved           PT Long Term Goals - 07/13/15 2706    PT LONG TERM GOAL #1   Title independent with HEP and understand how to progress herself   Time 8   Period Weeks   Status On-going   PT LONG TERM GOAL #2   Title grip strength on Rt >or = to 20#   Time 6   Period Weeks   Status On-going   PT LONG TERM GOAL #3   Title demonstrate Rt shoulder AROM IR to L3 to  improve self-care with Rt UE   Time 6   Period Weeks   Status On-going   PT LONG TERM GOAL #4   Title return to driving due to right shoulder strength 4/5   Time 6   Period Weeks   Status Partially Met   PT LONG TERM GOAL #5   Title able to cook due to right elbow AROM within functional limits   Time 6   Period Weeks   Status Partially Met               Plan - 07/13/15 2376    Clinical Impression Statement Pt with restrictions in Rt supination, shoulder flexion and abduction, due to decresed ROM in shoulder and elbow joint and restrictions due to softtissue limitations. After mob of radius slighly increase of supination. Pt with2 more visits before D/C     Pt will benefit from skilled therapeutic intervention in order to improve on the following deficits Decreased range of motion;Increased fascial restricitons;Impaired UE functional use;Increased muscle spasms;Decreased endurance;Decreased activity tolerance;Pain;Impaired flexibility;Hypomobility;Decreased scar mobility;Decreased mobility;Decreased strength;Increased edema   Clinical Impairments Affecting Rehab Potential patient is a 74 y.o. female with diagnosis s/p ORIF right supracondylar 12/23/2014 and right humerus fracture.   PT Frequency 2x / week   PT Duration 4 weeks   PT Treatment/Interventions ADLs/Self Care Home Management;Cryotherapy;Electrical Stimulation;Ultrasound;Moist Heat;Functional mobility training;Therapeutic activities;Therapeutic exercise;Neuromuscular re-education;Manual techniques;Patient/family education;Scar mobilization;Passive range of motion;Taping   PT Next Visit Plan 2 more visits. continue to improve Rt shoulder and elbow ROM, manual therapy and strengthening, before D/C next week    PT Home Exercise Plan continue current HEP   Consulted and Agree with Plan of Care Patient        Problem List Patient Active Problem List   Diagnosis Date Noted  . Right supracondylar humerus fracture; right  radial head/neck fracture 12/22/2014    NAUMANN-HOUEGNIFIO,Abbey Veith PTA 07/13/2015, 9:52 AM  [facility] [address] [place], [place], [address] Phone: [phone]   Fax:  [phone]  Name: Katherine Barnett MRN: [id number] Date of Birth: [birth year]

## 2015-07-15 ENCOUNTER — Encounter: Payer: Self-pay | Admitting: Physical Therapy

## 2015-07-15 ENCOUNTER — Ambulatory Visit: Payer: Medicare Other | Admitting: Physical Therapy

## 2015-07-15 DIAGNOSIS — M25621 Stiffness of right elbow, not elsewhere classified: Secondary | ICD-10-CM | POA: Diagnosis not present

## 2015-07-15 DIAGNOSIS — M25511 Pain in right shoulder: Secondary | ICD-10-CM

## 2015-07-15 DIAGNOSIS — M25641 Stiffness of right hand, not elsewhere classified: Secondary | ICD-10-CM

## 2015-07-15 DIAGNOSIS — M25611 Stiffness of right shoulder, not elsewhere classified: Secondary | ICD-10-CM

## 2015-07-15 DIAGNOSIS — M25521 Pain in right elbow: Secondary | ICD-10-CM

## 2015-07-15 DIAGNOSIS — M25631 Stiffness of right wrist, not elsewhere classified: Secondary | ICD-10-CM

## 2015-07-15 NOTE — Therapy (Signed)
[facility] [address] [place], [place], [address] Phone: [phone]   Fax:  [phone]  Physical Therapy Treatment  Patient Details  Name: Katherine Barnett MRN: [id number] Date of Birth: 1941/03/04 Referring Provider: Dr. Merry Lofty  Encounter Date: 07/15/2015      PT End of Session - 07/15/15 0904    Visit Number 27   Number of Visits 49   Date for PT Re-Evaluation 07/23/15   PT Start Time 0840   PT Stop Time 0944   PT Time Calculation (min) 64 min   Activity Tolerance Patient tolerated treatment well   Behavior During Therapy [facility] for tasks assessed/performed      Past Medical History  Diagnosis Date  . Hypertension   . Thyroid disease     pt denies this (12/18/14)  . Diabetes mellitus without complication ([place])     type 2 - diet controlled  . Complication of anesthesia     vomited after colonoscopy    Past Surgical History  Procedure Laterality Date  . Eye surgery Bilateral     cataract surgery with lens implant  . Colonoscopy    . Orif humerus fracture Right 12/22/2014    Procedure: OPEN REDUCTION INTERNAL FIXATION (ORIF) RIGHT DISTAL HUMERUS FRACTURE;  Surgeon: Mcarthur Rossetti, MD;  Location: [facility];  Service: Orthopedics;  Laterality: Right;    There were no vitals filed for this visit.  Visit Diagnosis:  Elbow stiffness, right  Shoulder stiffness, right  Right elbow pain  Right shoulder pain  Wrist stiffness, right  Stiffness of right hand joint      Subjective Assessment - 07/15/15 0848    Subjective No complain of pain, contnues to improve with her functional activties less "clampse" and endurance is improving.   Currently in Pain? No/denies            [facility] PT Assessment - 07/15/15 0001    AROM   Right/Left Elbow Right   Right Elbow Flexion 110   Right Elbow Extension -10   Right Forearm Supination 28 Degrees   Strength   Right Hand Grip (lbs) 5                      [facility] Adult PT Treatment/Exercise - 07/15/15 0001    Shoulder Exercises: Standing   Flexion AAROM  on doorframe for stretching   ABduction AAROM;10 reps  challenging, pt tearful   Other Standing Exercises chicken wings bilat elev/deression into abd 2 x 5   Shoulder Exercises: Pulleys   Flexion 3 minutes;2 minutes   ABduction 3 minutes   Shoulder Exercises: ROM/Strengthening   UBE (Upper Arm Bike) Level 1 32mn(4/4), pt with compensatory movement of shoulder hikking, but improving   Moist Heat Therapy   Number Minutes Moist Heat 10 Minutes   Moist Heat Location Shoulder  sitting with shoulder in 90 abduction positioned on mat tabl   Manual Therapy   Manual Therapy Soft tissue mobilization;Passive ROM   Soft tissue mobilization teres major, subscap, lats, wrist extensors   Passive ROM shoulder flex, abd, elbow supination                  PT Short Term Goals - 07/15/15 03428   PT SHORT TERM GOAL #1   Title independent with initital HEP for shoulder flexion and elbow ROM   Time 4   Period Weeks   Status Achieved   PT SHORT TERM GOAL #2  Title pain in right shoulder and elbow decreased >/= 25%   Time 4   Period Weeks   Status Achieved   PT SHORT TERM GOAL #3   Title right shoulder flexion PROM >/= 115 degrees   Time 4   Period Weeks   Status Achieved           PT Long Term Goals - 07/15/15 0913    PT LONG TERM GOAL #1   Title independent with HEP and understand how to progress herself   Time 8   Period Weeks   Status On-going   PT LONG TERM GOAL #2   Title grip strength on Rt >or = to 20#  as of Jul 15, 2015  5#   Time 6   Period Weeks   Status Not Met   PT LONG TERM GOAL #3   Title demonstrate Rt shoulder AROM IR to L3 to improve self-care with Rt UE  able to perform with assist from left UE   Time 6   Period Weeks   Status Partially Met   PT LONG TERM GOAL #4   Title return to driving due to right shoulder  strength 4/5   Time 6   Period Weeks   Status Achieved   PT LONG TERM GOAL #5   Title able to cook due to right elbow AROM within functional limits   Time 6   Period Weeks   Status Achieved               Plan - 07/15/15 0908    Clinical Impression Statement Pt with restrictions in Rt supination, shoulder flexion and abduction, due to decreased ROM in shoulder and elbow joint and restrictions due to softtissue limitations. Pt will be D/C to HEP next week.   Pt will benefit from skilled therapeutic intervention in order to improve on the following deficits Decreased range of motion;Increased fascial restricitons;Impaired UE functional use;Increased muscle spasms;Decreased endurance;Decreased activity tolerance;Pain;Impaired flexibility;Hypomobility;Decreased scar mobility;Decreased mobility;Decreased strength;Increased edema   Rehab Potential Good   Clinical Impairments Affecting Rehab Potential patient is a 74 y.o. female with diagnosis s/p ORIF right supracondylar 12/23/2014 and right humerus fracture.   PT Frequency 2x / week   PT Duration 4 weeks   PT Treatment/Interventions ADLs/Self Care Home Management;Cryotherapy;Electrical Stimulation;Ultrasound;Moist Heat;Functional mobility training;Therapeutic activities;Therapeutic exercise;Neuromuscular re-education;Manual techniques;Patient/family education;Scar mobilization;Passive range of motion;Taping   PT Next Visit Plan D/C next visit   PT Home Exercise Plan continue current HEP   Consulted and Agree with Plan of Care Patient        Problem List Patient Active Problem List   Diagnosis Date Noted  . Right supracondylar humerus fracture; right radial head/neck fracture 12/22/2014    NAUMANN-HOUEGNIFIO,Kagen Kunath  PTA  07/15/2015, 2:11 PM  [facility] [address] [place], [place], [address] Phone: [phone]   Fax:  [phone]  Name: Katherine Barnett MRN:  [id number] Date of Birth: [birth year]

## 2015-07-21 ENCOUNTER — Ambulatory Visit: Payer: Medicare Other

## 2015-07-21 DIAGNOSIS — M25621 Stiffness of right elbow, not elsewhere classified: Secondary | ICD-10-CM | POA: Diagnosis not present

## 2015-07-21 DIAGNOSIS — M25511 Pain in right shoulder: Secondary | ICD-10-CM

## 2015-07-21 DIAGNOSIS — M25611 Stiffness of right shoulder, not elsewhere classified: Secondary | ICD-10-CM

## 2015-07-21 DIAGNOSIS — M25521 Pain in right elbow: Secondary | ICD-10-CM

## 2015-07-21 NOTE — Therapy (Addendum)
[facility] [address] [place], [place], [address] Phone: [phone]   Fax:  [phone]  Physical Therapy Treatment  Patient Details  Name: Katherine Barnett MRN: [id number] Date of Birth: [birth year] Referring Provider: Dr. Merry Lofty  Encounter Date: 07/21/2015      PT End of Session - 07/21/15 0913    Visit Number 63   PT Start Time 0840   PT Stop Time 0933   PT Time Calculation (min) 53 min   Activity Tolerance Patient tolerated treatment well   Behavior During Therapy [facility] for tasks assessed/performed      Past Medical History  Diagnosis Date  . Hypertension   . Thyroid disease     pt denies this (12/18/14)  . Diabetes mellitus without complication ([place])     type 2 - diet controlled  . Complication of anesthesia     vomited after colonoscopy    Past Surgical History  Procedure Laterality Date  . Eye surgery Bilateral     cataract surgery with lens implant  . Colonoscopy    . Orif humerus fracture Right 12/22/2014    Procedure: OPEN REDUCTION INTERNAL FIXATION (ORIF) RIGHT DISTAL HUMERUS FRACTURE;  Surgeon: Mcarthur Rossetti, MD;  Location: [place];  Service: Orthopedics;  Laterality: Right;    There were no vitals filed for this visit.  Visit Diagnosis:  Elbow stiffness, right  Shoulder stiffness, right  Right elbow pain  Right shoulder pain      Subjective Assessment - 07/21/15 0928    Subjective Ready to D/C to HEP   Currently in Pain? No/denies            [facility] PT Assessment - 07/21/15 0001    Assessment   Medical Diagnosis s/p ORIF right Supra Condylar Humerus Fracture 6/1/201; Right proimal humerus  fracture   Onset Date/Surgical Date 12/22/14   Precautions   Precautions Shoulder   Precaution Comments osteoporosis   Prior Function   Level of Independence Independent   Cognition   Overall Cognitive Status Within Functional Limits for tasks assessed    Observation/Other Assessments   Focus on Therapeutic Outcomes (FOTO)  55% limitation   AROM   AROM Assessment Site Shoulder   Right/Left Shoulder Right   Right Shoulder Flexion 103 Degrees   Right Shoulder ABduction 86 Degrees   Right Shoulder Internal Rotation --  to Rt buttock   Right/Left Elbow Right   Right Elbow Flexion 110   Right Elbow Extension -10   Right Forearm Supination 28 Degrees   Strength   Overall Strength Deficits   Strength Assessment Site Hand;Shoulder;Elbow   Right/Left Shoulder Right   Right Shoulder Flexion 3+/5   Right Shoulder ABduction 3+/5   Right Shoulder Internal Rotation 4/5   Right Shoulder External Rotation 3-/5   Right/Left Elbow Right   Right Elbow Flexion 2+/5   Right Hand Grip (lbs) 9                     [facility] Adult PT Treatment/Exercise - 07/21/15 0001    Shoulder Exercises: Standing   Flexion AAROM  on doorframe for stretching   Row Strengthening;Both;20 reps;Weights   Row Weight (lbs) 3   Other Standing Exercises wall ladder to #10 2 x 5   Other Standing Exercises chicken wings bilat elev/deression into abd 2 x 5   Shoulder Exercises: Pulleys   Flexion 3 minutes;2 minutes   ABduction 3 minutes   Shoulder Exercises: ROM/Strengthening  UBE (Upper Arm Bike) Level 1 52mn(4/4), pt with compensatory movement of shoulder hikking, but improving   Wrist Exercises   Other wrist exercises elbow supination 3# x 20   Moist Heat Therapy   Number Minutes Moist Heat 10 Minutes   Moist Heat Location Shoulder  sitting with shoulder in 90 abduction positioned on mat tabl                  PT Short Term Goals - 07/15/15 05625   PT SHORT TERM GOAL #1   Title independent with initital HEP for shoulder flexion and elbow ROM   Time 4   Period Weeks   Status Achieved   PT SHORT TERM GOAL #2   Title pain in right shoulder and elbow decreased >/= 25%   Time 4   Period Weeks   Status Achieved   PT SHORT TERM GOAL #3   Title  right shoulder flexion PROM >/= 115 degrees   Time 4   Period Weeks   Status Achieved           PT Long Term Goals - 101/21/170850    PT LONG TERM GOAL #1   Title independent with HEP and understand how to progress herself   Status Achieved   PT LONG TERM GOAL #2   Title grip strength on Rt >or = to 20#   Status Partially Met  9#   PT LONG TERM GOAL #3   Title demonstrate Rt shoulder AROM IR to L3 to improve self-care with Rt UE   Status Partially Met  to Rt buttock   PT LONG TERM GOAL #4   Title return to driving due to right shoulder strength 4/5   Status Achieved  able to drive.  strength not > or = to 4/5   PT LONG TERM GOAL #5   Title able to cook due to right elbow AROM within functional limits   Status Achieved               Plan - [id number]    Clinical Impression Statement Pt will be discharged to HEP today.  Pt with continued Rt shoulder and elbow strength and AROM deficits s/p injury and ORIF.  Pt has reached a plateau at this time and will continue with comprehensive HEP for strength and AROM progression.  Pt has returned to driving and use of Rt UE with cooking tasks.     PT Next Visit Plan D/C PT to HEP   Consulted and Agree with Plan of Care Patient          G-Codes - 1Jan 21, 20170850    Functional Assessment Tool Used FOTO: 55% limitation   Functional Limitation Other PT primary   Other PT Primary Goal Status ((W3893 At least 20 percent but less than 40 percent impaired, limited or restricted   Other PT Primary Discharge Status (([id number] At least 40 percent but less than 60 percent impaired, limited or restricted      Problem List Patient Active Problem List   Diagnosis Date Noted  . Right supracondylar humerus fracture; right radial head/neck fracture 12/22/2014   PHYSICAL THERAPY DISCHARGE SUMMARY  Visits from Start of Care: 48  Current functional level related to goals / functional outcomes: See above for final goal status.      Remaining deficits:  Pt with Rt shoulder and elbow AROM and strength deficits s/p ORIF.  Pt has HEP in place for continued strength and AROM gains.  Education / Equipment: HEP Plan: Patient agrees to discharge.  Patient goals were partially met. Patient is being discharged due to being pleased with the current functional level.  ?????      TAKACS,KELLY, PT 07/21/2015, 9:28 AM  [facility] [address], [place], [place], [address] Phone: [phone]   Fax:  [phone]  Name: Arabell Neria MRN: [id number] Date of Birth: [birth year]

## 2016-10-11 HISTORY — PX: COLONOSCOPY: SHX174

## 2017-02-01 ENCOUNTER — Ambulatory Visit: Payer: Medicare Other | Admitting: Registered"

## 2017-05-28 NOTE — Progress Notes (Signed)
[facility] Report   Patient Details  Name: Katherine Barnett MRN: [id number] Date of Birth: February 09, 1941 Age: 76 y.o. PCP: [name], MD  Vitals:   05/28/17 1253  BP: 92/78  Pulse: 80  Resp: 18  Weight: 142 lb 12.8 oz (64.8 kg)     [name] Eval - 05/28/17 1200      Referral    Referring Provider  self    Reason for referral  High Cholesterol;Orthopedic;Diabetes    Program Start Date  05/28/17      Measurement   Waist Circumference  36 inches    Hip Circumference  40 inches    Body fat  44.5 percent      Information for Trainer   Goals  "to lose weight and improve A1c level"    Current Exercise  adult fitness/SS/Zumba Gold    Orthopedic Concerns  RT arm/elbow has limited ROM from a previous injury 2 yrs ago    Pertinent Medical History  osteoporosis, DM II, HTN, high cholesterol    Medications that affect exercise  Medication causing dizziness/drowsiness      Timed Up and Go (TUGS)   Timed Up and Go  Low risk <9 seconds      Mobility and Daily Activities   I find it easy to walk up or down two or more flights of stairs.  4    I have no trouble taking out the trash.  4    I do housework such as vacuuming and dusting on my own without difficulty.  4    I can easily lift a gallon of milk (8lbs).  2    I can easily walk a mile.  4    I have no trouble reaching into high cupboards or reaching down to pick up something from the floor.  4    I do not have trouble doing out-door work such as Armed forces logistics/support/administrative officer, raking leaves, or gardening.  4      Mobility and Daily Activities   I feel younger than my age.  3    I feel independent.  4    I feel energetic.  3    I live an active life.   4    I feel strong.  3    I feel healthy.  4    I feel active as other people my age.  4      How fit and strong are you.   Fit and Strong Total Score  51      Past Medical History:  Diagnosis Date  . Complication of anesthesia    vomited after colonoscopy  .  Diabetes mellitus without complication ([place])    type 2 - diet controlled  . Hypertension   . Thyroid disease    pt denies this (12/18/14)   Past Surgical History:  Procedure Laterality Date  . COLONOSCOPY    . EYE SURGERY Bilateral    cataract surgery with lens implant   Social History   Tobacco Use  Smoking Status Former Smoker  . Last attempt to quit: 12/17/1984  . Years since quitting: 32.4  Smokeless Tobacco Never Used       [name] 05/28/2017, 12:56 PM

## 2017-06-01 NOTE — Progress Notes (Signed)
[facility] [employer] PREP Weekly Session   Patient Details  Name: Katherine Barnett MRN: [id number] Date of Birth: Mar 19, 1941 Age: 76 y.o. PCP: [name], MD  Vitals:   06/01/17 1315  Weight: 143 lb (64.9 kg)    [name] Weekly seesion - 06/01/17 1300      Weekly Session   Topic Discussed  Water    Minutes exercised this week  135 minutes 45 each cardio/strength/flexibility   45 each cardio/strength/flexibility   Classes attended to date  1      Fun things you did since last meeting:"[place]" Things you are grateful for:"sister/brother" Barriers:"weight"  [name] 06/01/2017, 1:18 PM

## 2017-06-22 NOTE — Progress Notes (Signed)
[facility] [employer] PREP Weekly Session   Patient Details  Name: Katherine Barnett MRN: [id number] Date of Birth: 05/21/1941 Age: 76 y.o. PCP: [name], MD  Vitals:    [name] Weekly seesion - 06/22/17 1200      Weekly Session   Topic Discussed  Other portions   portions   Minutes exercised this week  135 minutes 45cardio/45strength/73flexibility   45cardio/45strength/85flexibility   Classes attended to date  2      Fun things you did this week:"craftsman show" Things you are grateful for:"family, friends" Nutrition celebrations:"thanksgiving" [place] after 12am"   [name] 06/22/2017, 12:54 PM

## 2017-06-29 NOTE — Progress Notes (Signed)
[facility] [employer] PREP Weekly Session   Patient Details  Name: Katherine Barnett MRN: [id number] Date of Birth: December 24, 1940 Age: 76 y.o. PCP: [name], MD  Vitals:    [name] Weekly seesion - 06/29/17 1200      Weekly Session   Topic Discussed  Hitting roadblocks    Minutes exercised this week  135 minutes 45cardio/45strengt/31flexibility   45cardio/45strengt/56flexibility   Classes attended to date  3      Fun things you did since last meeting:"going shopping"  [name] 06/29/2017, 12:42 PM

## 2017-07-13 ENCOUNTER — Encounter: Payer: Self-pay | Admitting: Registered"

## 2017-07-13 ENCOUNTER — Encounter: Payer: Medicare Other | Attending: Internal Medicine | Admitting: Registered"

## 2017-07-13 DIAGNOSIS — Z713 Dietary counseling and surveillance: Secondary | ICD-10-CM | POA: Diagnosis present

## 2017-07-13 DIAGNOSIS — E119 Type 2 diabetes mellitus without complications: Secondary | ICD-10-CM | POA: Diagnosis present

## 2017-07-13 DIAGNOSIS — E118 Type 2 diabetes mellitus with unspecified complications: Secondary | ICD-10-CM

## 2017-07-13 NOTE — Progress Notes (Signed)
[facility] [employer] PREP Weekly Session   Patient Details  Name: Katherine Barnett MRN: [id number] Date of Birth: [birth year] Age: 76 y.o. PCP: [name], MD  Vitals:   07/13/17 1048  Weight: 143 lb (64.9 kg)    [name] Weekly seesion - 07/13/17 1000      Weekly Session   Topic Discussed  Expectations and non-scale victories    Minutes exercised this week  120 minutes 30cardio/60strength/42flexibility   30cardio/60strength/24flexibility   Classes attended to date  4      Things you are grateful for:"family"  [name] 07/13/2017, 10:50 AM

## 2017-07-13 NOTE — Patient Instructions (Addendum)
Consider skipping the tea at night for better sleep Remember to include protein with your cereal. Walnuts are delicious on oatmeal. Aim to eat balanced meals and snacks if hungry, remembering to have protein Aim for 30-45 grams carbs per meal; 0-15 grams carbs for snacks Continue your regular activity Look at your glucose monitor user's manual page 33 for alternate site test instructions

## 2017-07-13 NOTE — Progress Notes (Signed)
Diabetes Self-Management Education  Visit Type: First/Initial  Appt. Start Time: 1015 Appt. End Time: 1115  07/13/2017  [name], identified by name and date of birth, is a 76 y.o. female with a diagnosis of Diabetes: Type 2.   ASSESSMENT Patient states she does not want to check blood sugar until January because she is afraid the number will be high. Pt also states she is motivated to do better and wants to be healthy as she ages.   Patient states she sleeps 6-7 hrs but broken, needs to get up around 3 am to urinate and then can't go back to sleep unless she eats a little something.  Patient states interested in low-sodium seasoning ideas and different ways to cook to make food taste good.   Patient states she will most likely come back after her doctor's appointment in March.  Diabetes Self-Management Education - 07/13/17 1016      Visit Information   Visit Type  First/Initial      Initial Visit   Diabetes Type  Type 2    Are you currently following a meal plan?  No    Are you taking your medications as prescribed?  Not on Medications    Date Diagnosed  2014      Health Coping   How would you rate your overall health?  Good      Psychosocial Assessment   Patient Belief/Attitude about Diabetes  Motivated to manage diabetes    How often do you need to have someone help you when you read instructions, pamphlets, or other written materials from your doctor or pharmacy?  1 - Never    What is the last grade level you completed in school?  BS Degree      Complications   Last HgB A1C per patient/outside source  7.6 % per referral    How often do you check your blood sugar?  0 times/day (not testing) not checking until after Christmas    Have you had a dilated eye exam in the past 12 months?  Yes    Have you had a dental exam in the past 12 months?  Yes    Are you checking your feet?  Yes    How many days per week are you checking your feet?  7      Dietary Intake    Breakfast  cereal, blueberries or 1/2 banana    Snack (morning)  none OR fruit    Lunch  left overs    Snack (afternoon)  none OR fruit OR cake, pie OR triscuit crackers    Dinner  vegetable, meat (shrimp & grits) spinach, tea - aims for seafood 2x week 7 pm    Snack (evening)  dessert OR 3 am applesauce    Beverage(s)  water, sweet tea, soda 1x month      Exercise   Exercise Type  Light (walking / raking leaves)    How many days per week to you exercise?  6    How many minutes per day do you exercise?  30    Total minutes per week of exercise  180      Patient Education   Previous Diabetes Education  Yes (please comment) 2014 pre diabetes class    Nutrition management   Role of diet in the treatment of diabetes and the relationship between the three main macronutrients and blood glucose level;Carbohydrate counting;Food label reading, portion sizes and measuring food.    Physical activity and  exercise   Role of exercise on diabetes management, blood pressure control and cardiac health.    Psychosocial adjustment  Other (comment) importance of sleep      Individualized Goals (developed by patient)   Nutrition  General guidelines for healthy choices and portions discussed    Physical Activity  Exercise 3-5 times per week    Monitoring   test my blood glucose as discussed      Outcomes   Expected Outcomes  Demonstrated interest in learning. Expect positive outcomes    Future DMSE  PRN    Program Status  Completed     Individualized Plan for Diabetes Self-Management Training:   Learning Objective:  Patient will have a greater understanding of diabetes self-management. Patient education plan is to attend individual and/or group sessions per assessed needs and concerns.   Patient Instructions  Consider skipping the tea at night for better sleep Remember to include protein with your cereal. Walnuts are delicious on oatmeal. Aim to eat balanced meals and snacks if hungry, remembering  to have protein Aim for 30-45 grams carbs per meal; 0-15 grams carbs for snacks Continue your regular activity Look at your glucose monitor user's manual page 33 for alternate site test instructions  Expected Outcomes:  Demonstrated interest in learning. Expect positive outcomes  Education material provided: Living Well with Diabetes, A1C conversion sheet, My Plate, Snack sheet, Carbohydrate counting sheet and No sodium seasonings, omega-3 fish options, bake-broil-grill cooking ideas  If problems or questions, patient to contact team via:  Phone  Future DSME appointment: PRN

## 2017-07-27 NOTE — Progress Notes (Signed)
[facility] [employer] PREP Weekly Session   Patient Details  Name: Katherine Barnett MRN: [id number] Date of Birth: 1940/11/03 Age: 77 y.o. PCP: [name], MD  There were no vitals filed for this visit.  [name] [employer] Weekly seesion - 07/27/17 0900      Weekly Session   Topic Discussed  Restaurant Eating    Minutes exercised this week  120 minutes 60cardio/30strength/54fexibility   60cardio/30strength/370fxibility   Classes attended to date  5      Nutrition celebrations for the week:"I met with a dietician last week" Barriers:"eating late"   [name]/10/2017, 9:49 AM

## 2017-08-06 NOTE — Progress Notes (Signed)
[facility] [employer] PREP Weekly Session   Patient Details  Name: Katherine Barnett MRN: [id number] Date of Birth: September 06, 1940 Age: 77 y.o. PCP: [name], MD  There were no vitals filed for this visit.  [name] [employer] Weekly seesion - 08/06/17 0900      Weekly Session   Topic Discussed  Stress management and problem solving    Minutes exercised this week  150 minutes 60cardio/60strength/23flexibility   60cardio/60strength/7flexibility   Classes attended to date  6      Things you are grateful for:"trainer" Nutrition celebrations:"less tea/more fruit" Barriers:"deserts"   [name] 08/06/2017, 9:28 AM

## 2017-08-13 ENCOUNTER — Other Ambulatory Visit: Payer: Self-pay | Admitting: Internal Medicine

## 2017-08-13 DIAGNOSIS — Z1231 Encounter for screening mammogram for malignant neoplasm of breast: Secondary | ICD-10-CM

## 2017-08-22 NOTE — Progress Notes (Signed)
[facility] [employer] PREP Weekly Session   Patient Details  Name: Katherine Barnett MRN: [id number] Date of Birth: 11/12/1940 Age: 77 y.o. PCP: [name], MD  Vitals:   08/15/17 0923  Weight: 143 lb (64.9 kg)    [name] [employer] Weekly seesion - 08/22/17 0900      Weekly Session   Topic Discussed  Finding support    Classes attended to date  7      Things you are grateful for:"This class" Nutrition celebrations:"No sodas, No candy" Barriers:"sugar/salt"  [name] 08/22/2017, 9:24 AM

## 2017-08-22 NOTE — Progress Notes (Signed)
[facility] [employer] PREP Weekly Session   Patient Details  Name: Katherine Barnett MRN: [id number] Date of Birth: 11/12/1940 Age: 77 y.o. PCP: [name], MD  Vitals:   08/22/17 1315  Weight: 140 lb (63.5 kg)    [name] [employer] Weekly seesion - 08/22/17 1300      Weekly Session   Topic Discussed  Other PREP class   PREP class   Minutes exercised this week  180 minutes 60cardio/60strength/68flexibility   60cardio/60strength/41flexibility   Classes attended to date  8      Fun things you did since last meeting:"zumba" Things you are grateful for:"trainer" Barriers:"snacking at night"  [name] 08/22/2017, 1:15 PM

## 2017-08-31 NOTE — Progress Notes (Signed)
[facility] [employer] PREP Weekly Session   Patient Details  Name: Katherine Barnett MRN: [id number] Date of Birth: Dec 30, 1940 Age: 77 y.o. PCP: [name], MD  Vitals:   08/29/17 1346  Weight: 140 lb (63.5 kg)    [name] [employer] Weekly seesion - 08/31/17 1300      Weekly Session   Topic Discussed  Other Guest speaker   Guest speaker   Minutes exercised this week  180 minutes 60cardio/60strength/35flexibility   60cardio/60strength/62flexibility   Classes attended to date  45      Fun things you did since last meeting:"Shopping" Things you are grateful for:"[employer] [place]" Nutrition celebrations:" Lower A1c" Barriers:"Desserts"   [name] 08/31/2017, 1:47 PM

## 2017-09-07 NOTE — Progress Notes (Signed)
[facility] [employer] PREP Weekly Session   Patient Details  Name: Katherine Barnett MRN: [id number] Date of Birth: 1940/11/08 Age: 77 y.o. PCP: [name], MD  Vitals:   09/05/17 1024  Weight: 140 lb (63.5 kg)    [name] [employer] Weekly seesion - 09/07/17 1000      Weekly Session   Topic Discussed  Healthy eating tips    Minutes exercised this week  180 minutes 60cardio/60strength/70flexibliity   60cardio/60strength/14flexibliity   Classes attended to date  10      Things you are grateful for:"My trainer" Barriers:"All the good things I like"  [name] 09/07/2017, 10:25 AM

## 2017-09-12 NOTE — Progress Notes (Signed)
[facility] [employer] PREP Weekly Session   Patient Details  Name: Katherine Barnett MRN: [id number] Date of Birth: 15-Jan-1941 Age: 77 y.o. PCP: [name], MD  There were no vitals filed for this visit.  [name] [employer] Weekly seesion - 09/12/17 1300      Weekly Session   Topic Discussed  Other ways to be active    Minutes exercised this week  165 minutes 60cardio/60strength/19flexibility   60cardio/60strength/7flexibility   Classes attended to date  21      Things you are grateful for:"this class" Nutrition celebrations:"making changes in my diet" Barriers:"sweets"  [name] 09/12/2017, 1:38 PM

## 2017-11-02 ENCOUNTER — Ambulatory Visit
Admission: RE | Admit: 2017-11-02 | Discharge: 2017-11-02 | Disposition: A | Discharge status: Home / Self Care | Payer: Medicare Other | Source: Ambulatory Visit | Attending: Internal Medicine | Admitting: Internal Medicine

## 2017-11-02 DIAGNOSIS — Z1231 Encounter for screening mammogram for malignant neoplasm of breast: Secondary | ICD-10-CM

## 2018-05-16 ENCOUNTER — Ambulatory Visit (INDEPENDENT_AMBULATORY_CARE_PROVIDER_SITE_OTHER): Payer: Self-pay

## 2018-05-16 ENCOUNTER — Encounter (INDEPENDENT_AMBULATORY_CARE_PROVIDER_SITE_OTHER): Payer: Self-pay | Admitting: Physician Assistant

## 2018-05-16 ENCOUNTER — Ambulatory Visit (INDEPENDENT_AMBULATORY_CARE_PROVIDER_SITE_OTHER): Payer: Medicare Other | Admitting: Physician Assistant

## 2018-05-16 DIAGNOSIS — M25521 Pain in right elbow: Secondary | ICD-10-CM | POA: Diagnosis not present

## 2018-05-16 DIAGNOSIS — M25511 Pain in right shoulder: Secondary | ICD-10-CM | POA: Diagnosis not present

## 2018-05-16 MED ORDER — LIDOCAINE HCL 1 % IJ SOLN
3.0000 mL | INTRAMUSCULAR | Status: AC | PRN
  Administered 2018-05-16: 3 mL

## 2018-05-16 MED ORDER — METHYLPREDNISOLONE ACETATE 40 MG/ML IJ SUSP
40.0000 mg | INTRAMUSCULAR | Status: AC | PRN
  Administered 2018-05-16: 40 mg via INTRA_ARTICULAR

## 2018-05-16 NOTE — Progress Notes (Signed)
Office Visit Note   Patient: Katherine Barnett           Date of Birth: Mar 26, 1941           MRN: [id number] Visit Date: 05/16/2018              Requested by: Katherine Custard, MD No address on file PCP: Katherine Custard, MD   Assessment & Plan: Visit Diagnoses:  1. Pain in right elbow   2. Right shoulder pain, unspecified chronicity     Plan: She will work on range of motion strengthening of the right shoulder.  She will follow-up with [place] if she has any questions or concerns.  After the subacromial injection of the right shoulder she had increased range of motion with diminished pain.  Follow-Up Instructions: Return if symptoms worsen or fail to improve.   Orders:  Orders Placed This Encounter  Procedures  . Large Joint Inj  . XR Elbow 2 Views Right  . XR Shoulder Right   No orders of the defined types were placed in this encounter.     Procedures: Large Joint Inj on 05/16/2018 5:00 PM Indications: pain Details: 22 G 1.5 in needle, lateral approach  Arthrogram: No  Medications: 3 mL lidocaine 1 %; 40 mg methylPREDNISolone acetate 40 MG/ML Outcome: tolerated well, no immediate complications Procedure, treatment alternatives, risks and benefits explained, specific risks discussed. Consent was given by the patient. Immediately prior to procedure a time out was called to verify the correct patient, procedure, equipment, support staff and site/side marked as required. Patient was prepped and draped in the usual sterile fashion.       Clinical Data: No additional findings.   Subjective: Chief Complaint  Patient presents with  . Right Shoulder - Pain  . Right Elbow - Pain    HPI [name] is well-known to Dr. Ninfa Barnett service she sustained a right distal humerus supracondylar fracture and underwent open reduction internal fixation back in 2016 also sustained a proximal humerus fracture which was treated conservatively.  She is done well until recently notes  that her shoulder now is seemingly weaker than it had.  She is concerned that there may be something going on with her elbow or her shoulder that is causing his weakness.  She is had no new injury.  She is having no numbness tingling down either arm. Review of Systems  See HPI Objective: Vital Signs: There were no vitals taken for this visit.  Physical Exam  Constitutional: She is oriented to person, place, and time. She appears well-developed and well-nourished. No distress.  Pulmonary/Chest: Effort normal.  Neurological: She is alert and oriented to person, place, and time.  Skin: She is not diaphoretic.    Ortho Exam Right elbow surgical incisions well-healed.  She does have some prominence of hardware nontender.  She is nontender of the medial lateral epicondyles of the elbow.  She has full pronation lacks the last approximate 15 degrees and supination.  Has good range of motion the elbow otherwise with full extension full flexion.  Right shoulder she has slight weakness with external rotation against resistance.  5 out of 5 strength with internal rotation against resistance bilaterally.  Forward flexion actively to approximately 110 passively and bring her to approximate 130-140 degrees.  Impingement testing positive on the right.  Diminished abduction and external rotation compared to the left shoulder. Specialty Comments:  No specialty comments available.  Imaging: Xr Elbow 2 Views Right  Result Date: 05/16/2018 Right elbow:  No hardware failure or loosening.  No bony abnormalities no acute fractures.  Xr Shoulder Right  Result Date: 05/16/2018 Right shoulder: Posttraumatic changes humeral heads located.  No acute fractures.  Proximal humerus fracture is well-healed.  The humerus remains in a shortened position due to the fracture.    PMFS History: Patient Active Problem List   Diagnosis Date Noted  . Right supracondylar humerus fracture; right radial head/neck fracture  12/22/2014   Past Medical History:  Diagnosis Date  . Complication of anesthesia    vomited after colonoscopy  . Diabetes mellitus without complication ([place])    type 2 - diet controlled  . Hypertension   . Thyroid disease    pt denies this (12/18/14)    No family history on file.  Past Surgical History:  Procedure Laterality Date  . BREAST EXCISIONAL BIOPSY Left    Lt axillary bx, was benign  . COLONOSCOPY    . EYE SURGERY Bilateral    cataract surgery with lens implant  . ORIF HUMERUS FRACTURE Right 12/22/2014   Procedure: OPEN REDUCTION INTERNAL FIXATION (ORIF) RIGHT DISTAL HUMERUS FRACTURE;  Surgeon: Katherine Rossetti, MD;  Location: [facility];  Service: Orthopedics;  Laterality: Right;   Social History   Occupational History  . Not on file  Tobacco Use  . Smoking status: Former Smoker    Last attempt to quit: 12/17/1984    Years since quitting: 33.4  . Smokeless tobacco: Never Used  Substance and Sexual Activity  . Alcohol use: No  . Drug use: No  . Sexual activity: Not on file

## 2018-09-27 ENCOUNTER — Other Ambulatory Visit: Payer: Self-pay | Admitting: Internal Medicine

## 2018-09-27 DIAGNOSIS — Z1231 Encounter for screening mammogram for malignant neoplasm of breast: Secondary | ICD-10-CM

## 2018-11-06 ENCOUNTER — Ambulatory Visit: Payer: Medicare Other

## 2018-12-26 ENCOUNTER — Other Ambulatory Visit: Payer: Self-pay

## 2018-12-26 ENCOUNTER — Ambulatory Visit
Admission: RE | Admit: 2018-12-26 | Discharge: 2018-12-26 | Disposition: A | Discharge status: Home / Self Care | Payer: Medicare Other | Source: Ambulatory Visit | Attending: Internal Medicine | Admitting: Internal Medicine

## 2018-12-26 DIAGNOSIS — Z1231 Encounter for screening mammogram for malignant neoplasm of breast: Secondary | ICD-10-CM

## 2018-12-27 ENCOUNTER — Other Ambulatory Visit: Payer: Self-pay | Admitting: Internal Medicine

## 2018-12-27 DIAGNOSIS — R928 Other abnormal and inconclusive findings on diagnostic imaging of breast: Secondary | ICD-10-CM

## 2019-01-02 ENCOUNTER — Other Ambulatory Visit: Payer: Self-pay

## 2019-01-02 ENCOUNTER — Ambulatory Visit
Admission: RE | Admit: 2019-01-02 | Discharge: 2019-01-02 | Disposition: A | Discharge status: Home / Self Care | Payer: Medicare Other | Source: Ambulatory Visit | Attending: Internal Medicine | Admitting: Internal Medicine

## 2019-01-02 ENCOUNTER — Other Ambulatory Visit: Payer: Self-pay | Admitting: Internal Medicine

## 2019-01-02 DIAGNOSIS — R928 Other abnormal and inconclusive findings on diagnostic imaging of breast: Secondary | ICD-10-CM

## 2019-01-02 DIAGNOSIS — R2232 Localized swelling, mass and lump, left upper limb: Secondary | ICD-10-CM

## 2019-01-03 ENCOUNTER — Ambulatory Visit
Admission: RE | Admit: 2019-01-03 | Discharge: 2019-01-03 | Disposition: A | Discharge status: Home / Self Care | Payer: Medicare Other | Source: Ambulatory Visit | Attending: Internal Medicine | Admitting: Internal Medicine

## 2019-01-03 ENCOUNTER — Other Ambulatory Visit (HOSPITAL_COMMUNITY)
Admission: RE | Admit: 2019-01-03 | Discharge: 2019-01-03 | Disposition: A | Discharge status: Home / Self Care | Payer: Medicare Other | Source: Ambulatory Visit | Attending: Diagnostic Radiology | Admitting: Diagnostic Radiology

## 2019-01-03 ENCOUNTER — Other Ambulatory Visit: Payer: Self-pay

## 2019-01-03 ENCOUNTER — Other Ambulatory Visit: Payer: Self-pay | Admitting: Internal Medicine

## 2019-01-03 DIAGNOSIS — D479 Neoplasm of uncertain behavior of lymphoid, hematopoietic and related tissue, unspecified: Secondary | ICD-10-CM | POA: Insufficient documentation

## 2019-01-03 DIAGNOSIS — R2232 Localized swelling, mass and lump, left upper limb: Secondary | ICD-10-CM

## 2019-02-03 ENCOUNTER — Other Ambulatory Visit: Payer: Self-pay | Admitting: Surgery

## 2019-03-04 NOTE — Pre-Procedure Instructions (Signed)
Katherine Barnett  03/04/2019      [id number] [place], [place] [address] [place] [address] Phone: [phone] Fax: [phone]    Your procedure is scheduled on March 07, 2019.  Report to [facility] Admitting at 530 AM.  Call this number if you have problems the morning of surgery:  [phone]   Remember:  Do not eat or drink after midnight.    Take these medicines the morning of surgery with A SIP OF WATER  -None  Follow your surgeon's instructions on when to hold/resume aspirin.  If no instructions were given call the office to determine how they would like to you take aspirin  7 days prior to surgery STOP taking any Aleve, Naproxen, Ibuprofen, Motrin, Advil, Goody's, BC's, all herbal medications, fish oil, and all vitamins   WHAT DO I DO ABOUT MY DIABETES MEDICATION?  Marland Kitchen Do not take oral diabetes medicines (pills) the morning of surgery- Metformin (Glucophage)  Reviewed and Endorsed by [facility] Patient Education Committee, August 2015  How to Manage Your Diabetes Before and After Surgery  Why is it important to control my blood sugar before and after surgery? . Improving blood sugar levels before and after surgery helps healing and can limit problems. . A way of improving blood sugar control is eating a healthy diet by: o  Eating less sugar and carbohydrates o  Increasing activity/exercise o  Talking with your doctor about reaching your blood sugar goals . High blood sugars (greater than 180 mg/dL) can raise your risk of infections and slow your recovery, so you will need to focus on controlling your diabetes during the weeks before surgery. . Make sure that the doctor who takes care of your diabetes knows about your planned surgery including the date and location.  How do I manage my blood sugar before surgery? . Check your blood sugar at least 4 times a day,  starting 2 days before surgery, to make sure that the level is not too high or low. o Check your blood sugar the morning of your surgery when you wake up and every 2 hours until you get to the Short Stay unit. . If your blood sugar is less than 70 mg/dL, you will need to treat for low blood sugar: o Do not take insulin. o Treat a low blood sugar (less than 70 mg/dL) with  cup of clear juice (cranberry or apple), 4 glucose tablets, OR glucose gel. Recheck blood sugar in 15 minutes after treatment (to make sure it is greater than 70 mg/dL). If your blood sugar is not greater than 70 mg/dL on recheck, call [phone] o  for further instructions. . Report your blood sugar to the short stay nurse when you get to Short Stay.  . If you are admitted to the hospital after surgery: o Your blood sugar will be checked by the staff and you will probably be given insulin after surgery (instead of oral diabetes medicines) to make sure you have good blood sugar levels. o The goal for blood sugar control after surgery is 80-180 mg/dL.   - Preparing For Surgery  Before surgery, you can play an important role. Because skin is not sterile, your skin needs to be as free of germs as possible. You can reduce the number of germs on your skin by washing with CHG (chlorahexidine gluconate) Soap before surgery.  CHG is an antiseptic  cleaner which kills germs and bonds with the skin to continue killing germs even after washing.    Oral Hygiene is also important to reduce your risk of infection.  Remember - BRUSH YOUR TEETH THE MORNING OF SURGERY WITH YOUR REGULAR TOOTHPASTE  Please do not use if you have an allergy to CHG or antibacterial soaps. If your skin becomes reddened/irritated stop using the CHG.  Do not shave (including legs and underarms) for at least 48 hours prior to first CHG shower. It is OK to shave your face.  Please follow these instructions carefully.   1. Shower the NIGHT BEFORE SURGERY  and the MORNING OF SURGERY with CHG.   2. If you chose to wash your hair, wash your hair first as usual with your normal shampoo.  3. After you shampoo, rinse your hair and body thoroughly to remove the shampoo.  4. Use CHG as you would any other liquid soap. You can apply CHG directly to the skin and wash gently with a scrungie or a clean washcloth.   5. Apply the CHG Soap to your body ONLY FROM THE NECK DOWN.  Do not use on open wounds or open sores. Avoid contact with your eyes, ears, mouth and genitals (private parts). Wash Face and genitals (private parts)  with your normal soap.  6. Wash thoroughly, paying special attention to the area where your surgery will be performed.  7. Thoroughly rinse your body with warm water from the neck down.  8. DO NOT shower/wash with your normal soap after using and rinsing off the CHG Soap.  9. Pat yourself dry with a CLEAN TOWEL.  10. Wear CLEAN PAJAMAS to bed the night before surgery, wear comfortable clothes the morning of surgery  11. Place CLEAN SHEETS on your bed the night of your first shower and DO NOT SLEEP WITH PETS.  Day of Surgery: Shower as above Do not apply any deodorants/lotions.  Please wear clean clothes to the hospital/surgery center.   Remember to brush your teeth WITH YOUR REGULAR TOOTHPASTE.    Do not wear jewelry, make-up or nail polish.  Do not wear lotions, powders, or perfumes, or deodorant.  Do not shave 48 hours prior to surgery.    Do not bring valuables to the hospital.  [facility] is not responsible for any belongings or valuables.  Contacts, dentures or bridgework may not be worn into surgery.  Leave your suitcase in the car.  After surgery it may be brought to your room.  For patients admitted to the hospital, discharge time will be determined by your treatment team.  Patients discharged the day of surgery will not be allowed to drive home.   Please read over the following fact sheets that you were  given.

## 2019-03-05 ENCOUNTER — Other Ambulatory Visit: Payer: Self-pay

## 2019-03-05 ENCOUNTER — Encounter (HOSPITAL_COMMUNITY)
Admission: RE | Admit: 2019-03-05 | Discharge: 2019-03-05 | Disposition: A | Discharge status: Home / Self Care | Payer: Medicare Other | Source: Ambulatory Visit | Attending: Surgery | Admitting: Surgery

## 2019-03-05 ENCOUNTER — Encounter (HOSPITAL_COMMUNITY): Payer: Self-pay

## 2019-03-05 DIAGNOSIS — Z01818 Encounter for other preprocedural examination: Secondary | ICD-10-CM | POA: Diagnosis present

## 2019-03-05 DIAGNOSIS — I10 Essential (primary) hypertension: Secondary | ICD-10-CM | POA: Diagnosis not present

## 2019-03-05 DIAGNOSIS — R591 Generalized enlarged lymph nodes: Secondary | ICD-10-CM | POA: Diagnosis not present

## 2019-03-05 DIAGNOSIS — E119 Type 2 diabetes mellitus without complications: Secondary | ICD-10-CM | POA: Diagnosis not present

## 2019-03-05 DIAGNOSIS — Z20828 Contact with and (suspected) exposure to other viral communicable diseases: Secondary | ICD-10-CM | POA: Insufficient documentation

## 2019-03-05 LAB — CBC
HCT: 40.9 % (ref 36.0–46.0)
Hemoglobin: 13 g/dL (ref 12.0–15.0)
MCH: 27.9 pg (ref 26.0–34.0)
MCHC: 31.8 g/dL (ref 30.0–36.0)
MCV: 87.8 fL (ref 80.0–100.0)
Platelets: 208 10*3/uL (ref 150–400)
RBC: 4.66 MIL/uL (ref 3.87–5.11)
RDW: 12.5 % (ref 11.5–15.5)
WBC: 4.2 10*3/uL (ref 4.0–10.5)
nRBC: 0 % (ref 0.0–0.2)

## 2019-03-05 LAB — BASIC METABOLIC PANEL
Anion gap: 10 (ref 5–15)
BUN: 15 mg/dL (ref 8–23)
CO2: 20 mmol/L — ABNORMAL LOW (ref 22–32)
Calcium: 9.6 mg/dL (ref 8.9–10.3)
Chloride: 110 mmol/L (ref 98–111)
Creatinine, Ser: 0.78 mg/dL (ref 0.44–1.00)
GFR calc Af Amer: >60 mL/min (ref >60)
GFR calc non Af Amer: >60 mL/min (ref >60)
Glucose, Bld: 202 mg/dL — ABNORMAL HIGH (ref 70–99)
Potassium: 3.7 mmol/L (ref 3.5–5.1)
Sodium: 140 mmol/L (ref 135–145)

## 2019-03-05 LAB — HEMOGLOBIN A1C
Hgb A1c MFr Bld: 7 % — ABNORMAL HIGH (ref 4.8–5.6)
Mean Plasma Glucose: 154.2 mg/dL

## 2019-03-05 LAB — GLUCOSE, CAPILLARY: Glucose-Capillary: 198 mg/dL — ABNORMAL HIGH (ref 70–99)

## 2019-03-05 NOTE — Pre-Procedure Instructions (Signed)
Katherine Barnett  03/05/2019      [id number] [place], [place] [address] AT [address] [place] [address] Phone: [phone] Fax: [phone]    Your procedure is scheduled on March 12 2019.  Report to [facility] Admitting at 12:15 PM.  Call this number if you have problems the morning of surgery:  [phone]   Remember:  Do not eat or drink after midnight.    Take these medicines the morning of surgery with A SIP OF WATER - None  Follow your surgeon's instructions on when to hold/resume aspirin.  If no instructions were given call the office to determine how they would like to you take aspirin  7 days prior to surgery STOP taking any Aleve, Naproxen, Ibuprofen, Motrin, Advil, Goody's, BC's, all herbal medications, fish oil, and all vitamins   WHAT DO I DO ABOUT MY DIABETES MEDICATION?  Marland Kitchen Do not take oral diabetes medicines (pills) the morning of surgery- Metformin (Glucophage)    How to Manage Your Diabetes Before and After Surgery  Why is it important to control my blood sugar before and after surgery? . Improving blood sugar levels before and after surgery helps healing and can limit problems. . A way of improving blood sugar control is eating a healthy diet by: o  Eating less sugar and carbohydrates o  Increasing activity/exercise o  Talking with your doctor about reaching your blood sugar goals . High blood sugars (greater than 180 mg/dL) can raise your risk of infections and slow your recovery, so you will need to focus on controlling your diabetes during the weeks before surgery. . Make sure that the doctor who takes care of your diabetes knows about your planned surgery including the date and location.  How do I manage my blood sugar before surgery? . Check your blood sugar at least 4 times a day, starting 2 days before surgery, to make sure that the level is not too high or  low. o Check your blood sugar the morning of your surgery when you wake up and every 2 hours until you get to the Short Stay unit. . If your blood sugar is less than 70 mg/dL, you will need to treat for low blood sugar: o Do not take insulin. o Treat a low blood sugar (less than 70 mg/dL) with  cup of clear juice (cranberry or apple), 4 glucose tablets, OR glucose gel. Recheck blood sugar in 15 minutes after treatment (to make sure it is greater than 70 mg/dL). If your blood sugar is not greater than 70 mg/dL on recheck, call [phone] o  for further instructions. . Report your blood sugar to the short stay nurse when you get to Short Stay.  . If you are admitted to the hospital after surgery: o Your blood sugar will be checked by the staff and you will probably be given insulin after surgery (instead of oral diabetes medicines) to make sure you have good blood sugar levels. o The goal for blood sugar control after surgery is 80-180 mg/dL.   [facility]- Preparing For Surgery  Before surgery, you can play an important role. Because skin is not sterile, your skin needs to be as free of germs as possible. You can reduce the number of germs on your skin by washing with CHG (chlorahexidine gluconate) Soap before surgery.  CHG is an antiseptic cleaner which kills germs and bonds with the skin to  continue killing germs even after washing.    Oral Hygiene is also important to reduce your risk of infection.  Remember - BRUSH YOUR TEETH THE MORNING OF SURGERY WITH YOUR REGULAR TOOTHPASTE  Please do not use if you have an allergy to CHG or antibacterial soaps. If your skin becomes reddened/irritated stop using the CHG.  Do not shave (including legs and underarms) for at least 48 hours prior to first CHG shower. It is OK to shave your face.  Please follow these instructions carefully.   1. Shower the NIGHT BEFORE SURGERY and the MORNING OF SURGERY with CHG.   2. If you chose to wash your hair,  wash your hair first as usual with your normal shampoo.  3. After you shampoo, rinse your hair and body thoroughly to remove the shampoo.  4. Use CHG as you would any other liquid soap. You can apply CHG directly to the skin and wash gently with a scrungie or a clean washcloth.   5. Apply the CHG Soap to your body ONLY FROM THE NECK DOWN.  Do not use on open wounds or open sores. Avoid contact with your eyes, ears, mouth and genitals (private parts). Wash Face and genitals (private parts)  with your normal soap.  6. Wash thoroughly, paying special attention to the area where your surgery will be performed.  7. Thoroughly rinse your body with warm water from the neck down.  8. DO NOT shower/wash with your normal soap after using and rinsing off the CHG Soap.  9. Pat yourself dry with a CLEAN TOWEL.  10. Wear CLEAN PAJAMAS to bed the night before surgery, wear comfortable clothes the morning of surgery  11. Place CLEAN SHEETS on your bed the night of your first shower and DO NOT SLEEP WITH PETS.  Day of Surgery: Shower as above Do not apply any deodorants/lotions.  Please wear clean clothes to the hospital/surgery center.   Remember to brush your teeth WITH YOUR REGULAR TOOTHPASTE.    Do not wear jewelry, make-up or nail polish.  Do not wear lotions, powders, or perfumes, or deodorant.  Do not shave 48 hours prior to surgery.    Do not bring valuables to the hospital.  [facility] is not responsible for any belongings or valuables.  Contacts, dentures or bridgework may not be worn into surgery.  Leave your suitcase in the car.  After surgery it may be brought to your room.  For patients admitted to the hospital, discharge time will be determined by your treatment team.  Patients discharged the day of surgery will not be allowed to drive home.   Please read over the following fact sheets that you were given.

## 2019-03-05 NOTE — Progress Notes (Signed)
PCP - [facility] Cardiologist - denies  Chest x-ray - n/a EKG - 03-05-19  DM - Type 2 Fasting Blood Sugar - 145-165   Aspirin Instructions: Follow your surgeon's instructions on when to stop ASA  Anesthesia review: n/a  Patient denies shortness of breath, fever, cough and chest pain at PAT appointment   Patient verbalized understanding of instructions that were given to them at the PAT appointment. Patient was also instructed that they will need to review over the PAT instructions again at home before surgery.

## 2019-03-08 ENCOUNTER — Other Ambulatory Visit (HOSPITAL_COMMUNITY)
Admission: RE | Admit: 2019-03-08 | Discharge: 2019-03-08 | Disposition: A | Discharge status: Home / Self Care | Payer: Medicare Other | Source: Ambulatory Visit | Attending: Surgery | Admitting: Surgery

## 2019-03-08 DIAGNOSIS — Z01818 Encounter for other preprocedural examination: Secondary | ICD-10-CM | POA: Diagnosis not present

## 2019-03-08 LAB — SARS CORONAVIRUS 2 (TAT 6-24 HRS): SARS Coronavirus 2: NEGATIVE

## 2019-03-11 NOTE — H&P (Signed)
Katherine Barnett  Location: [facility] Patient #: [id number] DOB: 1941-06-28 Widowed / Language: Cleophus Molt / Race: Black or African American Female   History of Present Illness  The patient is a 78 year old female who presents with lymphadenopathy. This patient is referred by Lennie Muckle PA for evaluation of lymphadenopathy. She had gone for screening mammography when she is found to have several enlarged masses in her left axilla. These appear to be consistent with lymph nodes. The largest was greater than 5 cm. She underwent a stereotactic biopsy which revealed atypical lymphoid proliferation worrisome for lymphoma. Complete excisional lymph node was recommended for complete histologic evaluation to rule out malignancy. She is currently completely asymptomatic. She has no constitutional symptoms of lymphoma. She denies recent weight loss, fever, night sweats, etc. She is otherwise without complaints.   Allergies No Known Allergies  [02/03/2019]: No Known Drug Allergies  [02/03/2019]: Allergies Reconciled   Medication History  Losartan Potassium-HCTZ (100-12.5MG  Tablet, Oral) Active. Simvastatin (10MG  Tablet, Oral) Active. Aspirin (81MG  Tablet DR, Oral) Active. Medications Reconciled  Vitals  Weight: 142.8 lb Height: 59in Body Surface Area: 1.6 m Body Mass Index: 28.84 kg/m  Temp.: 99.69F (Oral)  Pulse: 97 (Regular)  BP: 120/70(Sitting, Left Arm, Standard)    Physical Exam  General Mental Status-Alert. General Appearance-Consistent with stated age. Hydration-Well hydrated. Voice-Normal.  Head and Neck Head-normocephalic, atraumatic with no lesions or palpable masses. Trachea-midline. Thyroid Gland Characteristics - normal size and consistency.  Eye Eyeball - Bilateral-Extraocular movements intact. Sclera/Conjunctiva - Bilateral-No scleral icterus.  Chest and Lung Exam Chest and lung exam reveals -quiet,  even and easy respiratory effort with no use of accessory muscles and on auscultation, normal breath sounds, no adventitious sounds and normal vocal resonance. Inspection Chest Wall - Normal. Back - normal.  Breast Breast - Left-Symmetric, Non Tender, No Biopsy scars, no Dimpling - Left, No Inflammation, No Lumpectomy scars, No Mastectomy scars, No Peau d' Orange. Breast - Right-Symmetric, Non Tender, No Biopsy scars, no Dimpling - Right, No Inflammation, No Lumpectomy scars, No Mastectomy scars, No Peau d' Orange. Breast Lump-No Palpable Breast Mass.  Cardiovascular Cardiovascular examination reveals -normal heart sounds, regular rate and rhythm with no murmurs and normal pedal pulses bilaterally.  Abdomen Inspection Inspection of the abdomen reveals - No Hernias. Skin - Scar - no surgical scars. Palpation/Percussion Palpation and Percussion of the abdomen reveal - Soft, Non Tender, No Rebound tenderness, No Rigidity (guarding) and No hepatosplenomegaly. Auscultation Auscultation of the abdomen reveals - Bowel sounds normal.  Neurologic - Did not examine.  Musculoskeletal - Did not examine.  Lymphatic Head & Neck  General Head & Neck Lymphatics: Bilateral - Description - Normal. Axillary  General Axillary Region: Left: Size: Note: There are multiple enlarged, mobile lymph nodes in the left axilla which are nontender. Bilateral - Description - Normal. Tenderness - Non Tender. Femoral & Inguinal  Generalized Femoral & Inguinal Lymphatics: Bilateral - Description - Normal. Tenderness - Non Tender. Note: There is no cervical or supraclavicular adenopathy     Assessment & Plan  LYMPHADENOPATHY, AXILLARY (R59.0)  Impression: I have reviewed the patient's mammogram, ultrasound, and pathology results. I have discussed this with her. Again, an excisional biopsy of the left axillary lymphadenopathy is recommended for complete histologic evaluation to rule out malignancy  or lymphoma. I discussed the reasonings for this with her in detail. I discussed the surgical procedure in detail as well. We discussed the surgical risks as well as postoperative recovery. After a long  discussion, she appears to understand the reasons for surgery which will be scheduled.

## 2019-03-12 ENCOUNTER — Encounter (HOSPITAL_COMMUNITY)
Admission: RE | Disposition: A | Discharge status: Home / Self Care | Payer: Self-pay | Source: Home / Self Care | Attending: Surgery

## 2019-03-12 ENCOUNTER — Encounter (HOSPITAL_COMMUNITY): Payer: Self-pay

## 2019-03-12 ENCOUNTER — Ambulatory Visit (HOSPITAL_COMMUNITY): Payer: Medicare Other | Admitting: Certified Registered"

## 2019-03-12 ENCOUNTER — Ambulatory Visit (HOSPITAL_COMMUNITY)
Admission: RE | Admit: 2019-03-12 | Discharge: 2019-03-12 | Disposition: A | Discharge status: Home / Self Care | Payer: Medicare Other | Attending: Surgery | Admitting: Surgery

## 2019-03-12 ENCOUNTER — Other Ambulatory Visit: Payer: Self-pay

## 2019-03-12 DIAGNOSIS — Z87891 Personal history of nicotine dependence: Secondary | ICD-10-CM | POA: Diagnosis not present

## 2019-03-12 DIAGNOSIS — I10 Essential (primary) hypertension: Secondary | ICD-10-CM | POA: Diagnosis not present

## 2019-03-12 DIAGNOSIS — R590 Localized enlarged lymph nodes: Secondary | ICD-10-CM | POA: Insufficient documentation

## 2019-03-12 DIAGNOSIS — E119 Type 2 diabetes mellitus without complications: Secondary | ICD-10-CM | POA: Insufficient documentation

## 2019-03-12 DIAGNOSIS — Z7984 Long term (current) use of oral hypoglycemic drugs: Secondary | ICD-10-CM | POA: Insufficient documentation

## 2019-03-12 DIAGNOSIS — Z79899 Other long term (current) drug therapy: Secondary | ICD-10-CM | POA: Insufficient documentation

## 2019-03-12 DIAGNOSIS — Z7982 Long term (current) use of aspirin: Secondary | ICD-10-CM | POA: Insufficient documentation

## 2019-03-12 HISTORY — PX: AXILLARY LYMPH NODE BIOPSY: SHX5737

## 2019-03-12 LAB — GLUCOSE, CAPILLARY
Glucose-Capillary: 127 mg/dL — ABNORMAL HIGH (ref 70–99)
Glucose-Capillary: 112 mg/dL — ABNORMAL HIGH (ref 70–99)

## 2019-03-12 SURGERY — AXILLARY LYMPH NODE BIOPSY
Anesthesia: General | Site: Axilla | Laterality: Left

## 2019-03-12 MED ORDER — CHLORHEXIDINE GLUCONATE CLOTH 2 % EX PADS: 6.0000 | MEDICATED_PAD | Freq: Once | CUTANEOUS | Status: DC

## 2019-03-12 MED ORDER — PROMETHAZINE HCL 25 MG/ML IJ SOLN: 6.2500 mg | INTRAMUSCULAR | Status: DC | PRN

## 2019-03-12 MED ORDER — FENTANYL CITRATE (PF) 250 MCG/5ML IJ SOLN
INTRAMUSCULAR | Status: DC | PRN
  Administered 2019-03-12: 25 ug via INTRAVENOUS

## 2019-03-12 MED ORDER — ONDANSETRON HCL 4 MG/2ML IJ SOLN
INTRAMUSCULAR | Status: DC | PRN
  Administered 2019-03-12: 4 mg via INTRAVENOUS

## 2019-03-12 MED ORDER — CEFAZOLIN SODIUM-DEXTROSE 2-4 GM/100ML-% IV SOLN
2.0000 g | INTRAVENOUS | Status: AC
  Administered 2019-03-12: 2 g via INTRAVENOUS
  Filled 2019-03-12: qty 100

## 2019-03-12 MED ORDER — MEPERIDINE HCL 25 MG/ML IJ SOLN: 6.2500 mg | INTRAMUSCULAR | Status: DC | PRN

## 2019-03-12 MED ORDER — TRAMADOL HCL 50 MG PO TABS: 50.0000 mg | ORAL_TABLET | Freq: Four times a day (QID) | ORAL | 0 refills | Status: DC | PRN

## 2019-03-12 MED ORDER — FENTANYL CITRATE (PF) 250 MCG/5ML IJ SOLN
INTRAMUSCULAR | Status: AC
  Filled 2019-03-12: qty 5

## 2019-03-12 MED ORDER — POVIDONE-IODINE 10 % EX OINT
TOPICAL_OINTMENT | CUTANEOUS | Status: AC
  Filled 2019-03-12: qty 28.35

## 2019-03-12 MED ORDER — GABAPENTIN 300 MG PO CAPS
300.0000 mg | ORAL_CAPSULE | ORAL | Status: AC
  Administered 2019-03-12: 300 mg via ORAL
  Filled 2019-03-12: qty 1

## 2019-03-12 MED ORDER — MIDAZOLAM HCL 2 MG/2ML IJ SOLN: 0.5000 mg | Freq: Once | INTRAMUSCULAR | Status: DC | PRN

## 2019-03-12 MED ORDER — PROPOFOL 10 MG/ML IV BOLUS
INTRAVENOUS | Status: AC
  Filled 2019-03-12: qty 20

## 2019-03-12 MED ORDER — PROPOFOL 10 MG/ML IV BOLUS
INTRAVENOUS | Status: DC | PRN
  Administered 2019-03-12: 130 mg via INTRAVENOUS

## 2019-03-12 MED ORDER — LIDOCAINE HCL (PF) 1 % IJ SOLN
INTRAMUSCULAR | Status: AC
  Filled 2019-03-12: qty 30

## 2019-03-12 MED ORDER — BUPIVACAINE-EPINEPHRINE (PF) 0.25% -1:200000 IJ SOLN
INTRAMUSCULAR | Status: DC | PRN
  Administered 2019-03-12: 20 mL

## 2019-03-12 MED ORDER — LIDOCAINE 2% (20 MG/ML) 5 ML SYRINGE
INTRAMUSCULAR | Status: DC | PRN
  Administered 2019-03-12: 60 mg via INTRAVENOUS

## 2019-03-12 MED ORDER — PHENYLEPHRINE 40 MCG/ML (10ML) SYRINGE FOR IV PUSH (FOR BLOOD PRESSURE SUPPORT)
PREFILLED_SYRINGE | INTRAVENOUS | Status: DC | PRN
  Administered 2019-03-12: 80 ug via INTRAVENOUS

## 2019-03-12 MED ORDER — BUPIVACAINE-EPINEPHRINE (PF) 0.25% -1:200000 IJ SOLN
INTRAMUSCULAR | Status: AC
  Filled 2019-03-12: qty 30

## 2019-03-12 MED ORDER — LACTATED RINGERS IV SOLN
INTRAVENOUS | Status: DC
  Administered 2019-03-12: 11:00:00 via INTRAVENOUS

## 2019-03-12 MED ORDER — FENTANYL CITRATE (PF) 100 MCG/2ML IJ SOLN: 25.0000 ug | INTRAMUSCULAR | Status: DC | PRN

## 2019-03-12 MED ORDER — ACETAMINOPHEN 500 MG PO TABS
1000.0000 mg | ORAL_TABLET | ORAL | Status: AC
  Administered 2019-03-12: 1000 mg via ORAL
  Filled 2019-03-12: qty 2

## 2019-03-12 SURGICAL SUPPLY — 32 items
CANISTER SUCT 3000ML PPV (MISCELLANEOUS) ×3 IMPLANT
CHLORAPREP W/TINT 10.5 ML (MISCELLANEOUS) ×3 IMPLANT
COVER SURGICAL LIGHT HANDLE (MISCELLANEOUS) ×3 IMPLANT
COVER WAND RF STERILE (DRAPES) IMPLANT
DECANTER SPIKE VIAL GLASS SM (MISCELLANEOUS) ×3 IMPLANT
DERMABOND ADVANCED (GAUZE/BANDAGES/DRESSINGS) ×2
DERMABOND ADVANCED .7 DNX12 (GAUZE/BANDAGES/DRESSINGS) ×1 IMPLANT
DRAPE LAPAROTOMY 100X72 PEDS (DRAPES) ×3 IMPLANT
DRSG TEGADERM 4X4.75 (GAUZE/BANDAGES/DRESSINGS) ×3 IMPLANT
ELECT REM PT RETURN 9FT ADLT (ELECTROSURGICAL) ×3
ELECTRODE REM PT RTRN 9FT ADLT (ELECTROSURGICAL) ×1 IMPLANT
GAUZE 4X4 16PLY RFD (DISPOSABLE) ×3 IMPLANT
GAUZE SPONGE 4X4 12PLY STRL (GAUZE/BANDAGES/DRESSINGS) ×3 IMPLANT
GLOVE SURG SIGNA 7.5 PF LTX (GLOVE) ×3 IMPLANT
GOWN STRL REUS W/ TWL LRG LVL3 (GOWN DISPOSABLE) ×1 IMPLANT
GOWN STRL REUS W/ TWL XL LVL3 (GOWN DISPOSABLE) ×1 IMPLANT
GOWN STRL REUS W/TWL LRG LVL3 (GOWN DISPOSABLE) ×2
GOWN STRL REUS W/TWL XL LVL3 (GOWN DISPOSABLE) ×2
KIT BASIN OR (CUSTOM PROCEDURE TRAY) ×3 IMPLANT
KIT TURNOVER KIT B (KITS) ×3 IMPLANT
NEEDLE HYPO 25GX1X1/2 BEV (NEEDLE) ×3 IMPLANT
NS IRRIG 1000ML POUR BTL (IV SOLUTION) ×3 IMPLANT
PACK GENERAL/GYN (CUSTOM PROCEDURE TRAY) ×3 IMPLANT
PAD ARMBOARD 7.5X6 YLW CONV (MISCELLANEOUS) ×6 IMPLANT
PENCIL SMOKE EVACUATOR (MISCELLANEOUS) ×3 IMPLANT
SPECIMEN JAR MEDIUM (MISCELLANEOUS) ×3 IMPLANT
SUT MNCRL AB 4-0 PS2 18 (SUTURE) ×3 IMPLANT
SUT VIC AB 3-0 SH 27 (SUTURE) ×2
SUT VIC AB 3-0 SH 27XBRD (SUTURE) ×1 IMPLANT
SYR CONTROL 10ML LL (SYRINGE) ×3 IMPLANT
TOWEL GREEN STERILE (TOWEL DISPOSABLE) ×3 IMPLANT
TOWEL GREEN STERILE FF (TOWEL DISPOSABLE) ×3 IMPLANT

## 2019-03-12 NOTE — Op Note (Addendum)
EXCISIONAL BIOPSY DEEP LEFT AXILLARY LYMPH NODES  Procedure Note  Katherine Barnett 03/12/2019   Pre-op Diagnosis: LYMPHADENOPATHY LEFT AXILLA     Post-op Diagnosis: same  Procedure(s): EXCISIONAL BIOPSY DEEP LEFT AXILLARY LYMPH NODES  Surgeon(s): Coralie Keens, MD  Anesthesia: General  Staff:  Circulator: Hal Morales, RN Relief Circulator: Vickey Huger, RN Scrub Person: Small, Benjaman Lobe, RN Circulator Assistant: Dollene Cleveland T  Estimated Blood Loss: Minimal               Specimens: sent to path   Indications: This is a 78 year old female who was found to have several enlarged lymph nodes in her left axilla on screening mammography.  She underwent a biopsy of 1 of the lymph nodes showing a lymphoproliferative disorder worrisome for lymphoma.  The decision was made to proceed to the operating room for excisional biopsy of the lymph nodes for complete histologic evaluation  Procedure: The patient was brought to the operating room and identifies correct patient.  She was placed upon the operating table and general anesthesia was induced.  Her left axilla was then prepped and draped in usual sterile fashion.  I anesthetized skin with Marcaine and then made a longitudinal incision with a scalpel I then dissected down to the deep left axillary tissue with electrocautery.  The largest lymph node was easily identified and elevated.  I clipped the blood supply with surgical clips and then transected this and remove the lymph nodes in entirety.  A second slightly smaller lymph node was removed adjacent to this in a similar fashion.  Several small bridging veins were also clipped with surgical clips.  The lymph nodes were then sent to pathology for evaluation.  Hemostasis appeared to be achieved.  I anesthetized the incision further with Marcaine.  I then closed the subcutaneous tissue with interrupted 3-0 Vicryl sutures and closed the skin with a running 4-0 Monocryl.  Dermabond  was then applied.  The patient tolerated procedure well.  All the counts were correct at the end of the procedure.  The patient was then extubated in the operating room and taken in a stable condition to the recovery room.          Coralie Keens   Date: 03/12/2019  Time: 1:38 PM

## 2019-03-12 NOTE — Anesthesia Procedure Notes (Signed)
Procedure Name: LMA Insertion Date/Time: 03/12/2019 1:08 PM Performed by: Babs Bertin, CRNA Pre-anesthesia Checklist: Patient identified, Emergency Drugs available, Suction available and Patient being monitored Patient Re-evaluated:Patient Re-evaluated prior to induction Oxygen Delivery Method: Circle System Utilized Preoxygenation: Pre-oxygenation with 100% oxygen Induction Type: IV induction Ventilation: Mask ventilation without difficulty LMA: LMA inserted LMA Size: 4.0 Number of attempts: 1 Airway Equipment and Method: Bite block Placement Confirmation: positive ETCO2 Tube secured with: Tape Dental Injury: Teeth and Oropharynx as per pre-operative assessment

## 2019-03-12 NOTE — Anesthesia Postprocedure Evaluation (Signed)
Anesthesia Post Note  Patient: Katherine Barnett  Procedure(s) Performed: EXCISIONAL BIOPSY LEFT AXILLARY LYMPH NODES (Left Axilla)     Patient location during evaluation: PACU Anesthesia Type: General Level of consciousness: awake and alert, patient cooperative and oriented Pain management: pain level controlled Vital Signs Assessment: post-procedure vital signs reviewed and stable Respiratory status: spontaneous breathing, nonlabored ventilation and respiratory function stable Cardiovascular status: blood pressure returned to baseline and stable Postop Assessment: no apparent nausea or vomiting Anesthetic complications: no    Last Vitals:  Vitals:   03/12/19 1400 03/12/19 1415  BP: 104/67 117/67  Pulse: 86 77  Resp: (!) 22 (!) 22  Temp:    SpO2: 95% 100%    Last Pain:  Vitals:   03/12/19 1116  TempSrc:   PainSc: 0-No pain                 Keane Martelli,E. Haziel Molner

## 2019-03-12 NOTE — Transfer of Care (Signed)
Immediate Anesthesia Transfer of Care Note  Patient: Katherine Barnett  Procedure(s) Performed: EXCISIONAL BIOPSY LEFT AXILLARY LYMPH NODES (Left Axilla)  Patient Location: PACU  Anesthesia Type:General  Level of Consciousness: awake and alert   Airway & Oxygen Therapy: Patient Spontanous Breathing and Patient connected to face mask oxygen  Post-op Assessment: Report given to RN and Post -op Vital signs reviewed and stable  Post vital signs: Reviewed and stable  Last Vitals:  Vitals Value Taken Time  BP 110/67 03/12/19 1346  Temp    Pulse 99 03/12/19 1347  Resp 12 03/12/19 1348  SpO2 97 % 03/12/19 1347  Vitals shown include unvalidated device data.  Last Pain:  Vitals:   03/12/19 1116  TempSrc:   PainSc: 0-No pain         Complications: No apparent anesthesia complications

## 2019-03-12 NOTE — Interval H&P Note (Signed)
History and Physical Interval Note:no change in H and P  03/12/2019 12:34 PM  Katherine Barnett  has presented today for surgery, with the diagnosis of LYMPHADENOPATHY LEFT AXILLA.  The various methods of treatment have been discussed with the patient and family. After consideration of risks, benefits and other options for treatment, the patient has consented to  Procedure(s): EXCISIONAL BIOPSY LEFT AXILLARY LYMPH NODES (Left) as a surgical intervention.  The patient's history has been reviewed, patient examined, no change in status, stable for surgery.  I have reviewed the patient's chart and labs.  Questions were answered to the patient's satisfaction.     Coralie Keens

## 2019-03-12 NOTE — Discharge Instructions (Signed)
Ok to shower starting tomorrow  Ice pack, Tylenol, and ibuprofen also for pain.  Expect some swelling.  No vigorous activity for 1 week.

## 2019-03-12 NOTE — Anesthesia Preprocedure Evaluation (Addendum)
Anesthesia Evaluation  Patient identified by MRN, date of birth, ID band Patient awake    Reviewed: Allergy & Precautions, NPO status , Patient's Chart, lab work & pertinent test results  History of Anesthesia Complications (+) PONV  Airway Mallampati: II  TM Distance: >3 FB Neck ROM: Full    Dental  (+) Missing, Dental Advisory Given   Pulmonary former smoker (quit 1986),  03/08/2019 SARS coronavirus NEG   breath sounds clear to auscultation       Cardiovascular hypertension, Pt. on medications (-) angina Rhythm:Regular Rate:Normal     Neuro/Psych negative neurological ROS     GI/Hepatic negative GI ROS, Neg liver ROS,   Endo/Other  diabetes (glu 127), Oral Hypoglycemic Agents  Renal/GU negative Renal ROS     Musculoskeletal   Abdominal   Peds  Hematology negative hematology ROS (+)   Anesthesia Other Findings   Reproductive/Obstetrics                            Anesthesia Physical Anesthesia Plan  ASA: II  Anesthesia Plan: General   Post-op Pain Management:    Induction: Intravenous  PONV Risk Score and Plan: 4 or greater and Ondansetron, Dexamethasone and Treatment may vary due to age or medical condition  Airway Management Planned: LMA  Additional Equipment:   Intra-op Plan:   Post-operative Plan:   Informed Consent: I have reviewed the patients History and Physical, chart, labs and discussed the procedure including the risks, benefits and alternatives for the proposed anesthesia with the patient or authorized representative who has indicated his/her understanding and acceptance.     Dental advisory given  Plan Discussed with: CRNA and Surgeon  Anesthesia Plan Comments:         Anesthesia Quick Evaluation

## 2019-03-13 ENCOUNTER — Encounter (HOSPITAL_COMMUNITY): Payer: Self-pay | Admitting: Surgery

## 2019-04-01 ENCOUNTER — Other Ambulatory Visit (HOSPITAL_COMMUNITY)
Admission: RE | Admit: 2019-04-01 | Discharge: 2019-04-01 | Disposition: A | Discharge status: Home / Self Care | Payer: Medicare Other | Source: Ambulatory Visit | Attending: Surgery | Admitting: Surgery

## 2019-04-01 DIAGNOSIS — C8104 Nodular lymphocyte predominant Hodgkin lymphoma, lymph nodes of axilla and upper limb: Secondary | ICD-10-CM | POA: Diagnosis present

## 2019-04-03 ENCOUNTER — Telehealth: Payer: Self-pay | Admitting: Internal Medicine

## 2019-04-03 NOTE — Telephone Encounter (Signed)
Received a new patient referral from Dr. Ninfa Linden at [facility] for a dx of hodgkin lymphoma. Ms Hardbarger has been cld and scheduled to see Dr. Julien Nordmann on 9/15 at 2:15pm w/labs at 1:45pm. She's been made aware to arrive 15 minutes early.

## 2019-04-07 ENCOUNTER — Other Ambulatory Visit: Payer: Self-pay | Admitting: Medical Oncology

## 2019-04-07 DIAGNOSIS — C8190 Hodgkin lymphoma, unspecified, unspecified site: Secondary | ICD-10-CM

## 2019-04-08 ENCOUNTER — Encounter: Payer: Self-pay | Admitting: Internal Medicine

## 2019-04-08 ENCOUNTER — Inpatient Hospital Stay: Payer: Medicare Other | Attending: Internal Medicine | Admitting: Internal Medicine

## 2019-04-08 ENCOUNTER — Inpatient Hospital Stay: Payer: Medicare Other

## 2019-04-08 ENCOUNTER — Other Ambulatory Visit: Payer: Self-pay

## 2019-04-08 ENCOUNTER — Other Ambulatory Visit: Payer: Self-pay | Admitting: Internal Medicine

## 2019-04-08 VITALS — BP 121/70 | HR 93 | Temp 98.9°F | Resp 18 | Ht 59.0 in | Wt 145.0 lb

## 2019-04-08 DIAGNOSIS — C8104 Nodular lymphocyte predominant Hodgkin lymphoma, lymph nodes of axilla and upper limb: Secondary | ICD-10-CM

## 2019-04-08 DIAGNOSIS — E785 Hyperlipidemia, unspecified: Secondary | ICD-10-CM | POA: Insufficient documentation

## 2019-04-08 DIAGNOSIS — Z87891 Personal history of nicotine dependence: Secondary | ICD-10-CM

## 2019-04-08 DIAGNOSIS — I10 Essential (primary) hypertension: Secondary | ICD-10-CM | POA: Diagnosis not present

## 2019-04-08 DIAGNOSIS — C8190 Hodgkin lymphoma, unspecified, unspecified site: Secondary | ICD-10-CM

## 2019-04-08 DIAGNOSIS — E119 Type 2 diabetes mellitus without complications: Secondary | ICD-10-CM | POA: Insufficient documentation

## 2019-04-08 LAB — CMP (CANCER CENTER ONLY)
ALT: 17 U/L (ref 0–44)
AST: 16 U/L (ref 15–41)
Albumin: 4.5 g/dL (ref 3.5–5.0)
Alkaline Phosphatase: 81 U/L (ref 38–126)
Anion gap: 9 (ref 5–15)
BUN: 17 mg/dL (ref 8–23)
CO2: 27 mmol/L (ref 22–32)
Calcium: 9.7 mg/dL (ref 8.9–10.3)
Chloride: 105 mmol/L (ref 98–111)
Creatinine: 0.86 mg/dL (ref 0.44–1.00)
GFR, Est AFR Am: >60 mL/min (ref >60)
GFR, Estimated: >60 mL/min (ref >60)
Glucose, Bld: 80 mg/dL (ref 70–99)
Potassium: 3.8 mmol/L (ref 3.5–5.1)
Sodium: 141 mmol/L (ref 135–145)
Total Bilirubin: 0.5 mg/dL (ref 0.3–1.2)
Total Protein: 7.3 g/dL (ref 6.5–8.1)

## 2019-04-08 LAB — CBC WITH DIFFERENTIAL (CANCER CENTER ONLY)
Abs Immature Granulocytes: 0 10*3/uL (ref 0.00–0.07)
Basophils Absolute: 0 10*3/uL (ref 0.0–0.1)
Basophils Relative: 0 %
Eosinophils Absolute: 0.4 10*3/uL (ref 0.0–0.5)
Eosinophils Relative: 8 %
HCT: 39.2 % (ref 36.0–46.0)
Hemoglobin: 12.6 g/dL (ref 12.0–15.0)
Immature Granulocytes: 0 %
Lymphocytes Relative: 38 %
Lymphs Abs: 1.9 10*3/uL (ref 0.7–4.0)
MCH: 27.8 pg (ref 26.0–34.0)
MCHC: 32.1 g/dL (ref 30.0–36.0)
MCV: 86.5 fL (ref 80.0–100.0)
Monocytes Absolute: 0.6 10*3/uL (ref 0.1–1.0)
Monocytes Relative: 12 %
Neutro Abs: 2.1 10*3/uL (ref 1.7–7.7)
Neutrophils Relative %: 42 %
Platelet Count: 199 10*3/uL (ref 150–400)
RBC: 4.53 MIL/uL (ref 3.87–5.11)
RDW: 12.7 % (ref 11.5–15.5)
WBC Count: 5 10*3/uL (ref 4.0–10.5)
nRBC: 0 % (ref 0.0–0.2)

## 2019-04-08 LAB — LACTATE DEHYDROGENASE: LDH: 158 U/L (ref 98–192)

## 2019-04-08 LAB — URIC ACID: Uric Acid, Serum: 4.9 mg/dL (ref 2.5–7.1)

## 2019-04-08 LAB — SEDIMENTATION RATE: Sed Rate: 9 mm/hr (ref 0–22)

## 2019-04-08 NOTE — Progress Notes (Signed)
[facility] Telephone:([phone]   [phone]  CONSULT NOTE  REFERRING PHYSICIAN: [name], MD  REASON FOR CONSULTATION:  78 years old African-American female recently diagnosed with Hodgkin lymphoma.  HPI Katherine Barnett is a 78 y.o. female with past medical history significant for hypertension, diabetes mellitus, dyslipidemia.  The patient had a routine mammogram on December 26, 2018 and that showed a possible mass in the left axilla.  This was followed by ultrasound of the left axilla on 01/02/2019 and that showed 4 large oval circumscribed hypoechoic masses likely representing lymph nodes the largest measured 5.5 x 2.3 x 4.9 cm.  The next largest measured 3.8 x 1.7 x 3.5 cm and there was 2 other smaller but with similar-appearing masses.  On 01/03/2019 and the patient underwent ultrasound core biopsy of the left axillary lymph node.  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  Excisional biopsy was recommended.  On 03/12/2019 the patient underwent excisional biopsy of deep left axillary lymph nodes by Dr. Coralie Keens.  The final pathology([id number]) showed Hodgkin lymphoma.  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 The patient was referred to me today for further evaluation and recommendation regarding treatment of her condition. When seen today the patient is feeling fine with no concerning complaints.  She denied having any pain in the left axilla.  She denied having any chest pain, shortness of breath, cough or hemoptysis.  She denied having any fever or chills.  She has no nausea, vomiting, diarrhea or constipation.  She denied having any weight loss but has intermittent night sweats. Family history significant for mother and father died from old age in their  late 70s and early 78s.  The patient is a widow and has 2 stepchildren.  She was accompanied on the phone today by her sister-in-law Orbie Hurst and her brother Legrand Como.  She used to work as a Dispensing optician in middle school.  She has a history for smoking for around 10 years but quit in 1980.  She has no history of alcohol or drug abuse.  HPI  Past Medical History:  Diagnosis Date  . Complication of anesthesia    vomited after colonoscopy  . Diabetes mellitus without complication ([place])    type 2 - diet controlled  . Hypertension     Past Surgical History:  Procedure Laterality Date  . AXILLARY LYMPH NODE BIOPSY Left 03/12/2019   Procedure: EXCISIONAL BIOPSY LEFT AXILLARY LYMPH NODES;  Surgeon: Coralie Keens, MD;  Location: [facility];  Service: General;  Laterality: Left;  . BREAST EXCISIONAL BIOPSY Left    Lt axillary bx, was benign  . COLONOSCOPY    . EYE SURGERY Bilateral    cataract surgery with lens implant  . ORIF HUMERUS FRACTURE Right 12/22/2014   Procedure: OPEN REDUCTION INTERNAL FIXATION (ORIF) RIGHT DISTAL HUMERUS FRACTURE;  Surgeon: Mcarthur Rossetti, MD;  Location: [facility];  Service: Orthopedics;  Laterality: Right;    No family history on file.  Social History Social History   Tobacco Use  . Smoking status: Former Smoker    Quit date: 12/17/1984    Years since quitting: 34.3  . Smokeless tobacco: Never Used  Substance Use Topics  . Alcohol use: No  . Drug use: No    No Known Allergies  Current Outpatient Medications  Medication Sig Dispense Refill  . aspirin 81  MG tablet Take 81 mg by mouth daily.    . calcium-vitamin D (OSCAL WITH D) 500-200 MG-UNIT per tablet Take 1 tablet by mouth daily with breakfast.    . losartan-hydrochlorothiazide (HYZAAR) 50-12.5 MG per tablet Take 1 tablet by mouth daily.    . metFORMIN (GLUCOPHAGE) 500 MG tablet Take 500 mg by mouth daily.    . simvastatin (ZOCOR) 20 MG tablet Take 20 mg by mouth at  bedtime.    . traMADol (ULTRAM) 50 MG tablet Take 1 tablet (50 mg total) by mouth every 6 (six) hours as needed for moderate pain or severe pain. 25 tablet 0  . valACYclovir (VALTREX) 1000 MG tablet Take 500 mg by mouth 2 (two) times daily as needed (fever blisters).      No current facility-administered medications for this visit.     Review of Systems  Constitutional: positive for night sweats Eyes: negative Ears, nose, mouth, throat, and face: negative Respiratory: negative Cardiovascular: negative Gastrointestinal: negative Genitourinary:negative Integument/breast: negative Hematologic/lymphatic: negative Musculoskeletal:negative Neurological: negative Behavioral/Psych: negative Endocrine: negative Allergic/Immunologic: negative  Physical Exam  KGM:WNUUV, healthy, no distress, well nourished and well developed SKIN: skin color, texture, turgor are normal, no rashes or significant lesions HEAD: Normocephalic, No masses, lesions, tenderness or abnormalities EYES: normal, PERRLA, Conjunctiva are pink and non-injected EARS: External ears normal, Canals clear OROPHARYNX:no exudate, no erythema and lips, buccal mucosa, and tongue normal  NECK: supple, no adenopathy, no JVD LYMPH:  no palpable lymphadenopathy, no hepatosplenomegaly but there was palpable left axillary lymphadenopathy nontender to palpation. BREAST:not examined LUNGS: clear to auscultation , and palpation HEART: regular rate & rhythm, no murmurs and no gallops ABDOMEN:abdomen soft, non-tender, normal bowel sounds and no masses or organomegaly BACK: No CVA tenderness, Range of motion is normal EXTREMITIES:no joint deformities, effusion, or inflammation, no edema  NEURO: alert & oriented x 3 with fluent speech, no focal motor/sensory deficits  PERFORMANCE STATUS: ECOG 1  LABORATORY DATA: Lab Results  Component Value Date   WBC 5.0 04/08/2019   HGB 12.6 04/08/2019   HCT 39.2 04/08/2019   MCV 86.5 04/08/2019    PLT 199 04/08/2019      Chemistry      Component Value Date/Time   NA 140 03/05/2019 1005   K 3.7 03/05/2019 1005   CL 110 03/05/2019 1005   CO2 20 (L) 03/05/2019 1005   BUN 15 03/05/2019 1005   CREATININE 0.78 03/05/2019 1005      Component Value Date/Time   CALCIUM 9.6 03/05/2019 1005       RADIOGRAPHIC STUDIES: No results found.  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   PLAN: I had a lengthy discussion with the patient and her family today about her current condition and further investigation to confirm the stage of her disease and also possible treatment options. I ordered several studies today including CBC, comprehensive metabolic panel, LDH, uric acid.  I will also order acute hepatitis panel, HIV antibody as well as Epstein virus serology, ESR and beta-2 microglobulin. I will order a PET scan to complete the staging work-up of her disease and have it as a baseline for future staging work-up and assessment of treatment response. I will also arrange for the patient to have 2D echo as well as pulmonary function test. I will refer the patient to IR for a bone marrow biopsy and aspirate to rule out  involvement of the bone marrow. I will see the patient back for follow-up visit in 2 weeks for reevaluation and more detailed discussion of her treatment options based on the staging work-up and blood work. She was advised to call immediately if she has any concerning symptoms in the interval. The patient voices understanding of current disease status and treatment options and is in agreement with the current care plan.  All questions were answered. The patient knows to call the clinic with any problems, questions or concerns. We can certainly see the patient much sooner if necessary.  Thank you so much for allowing  me to participate in the care of [employer]. I will continue to follow up the patient with you and assist in her care.  I spent 40 minutes counseling the patient face to face. The total time spent in the appointment was 60 minutes.  Disclaimer: This note was dictated with voice recognition software. Similar sounding words can inadvertently be transcribed and may not be corrected upon review.   Eilleen Kempf April 08, 2019, 2:22 PM

## 2019-04-09 LAB — HEPATITIS PANEL, ACUTE
HCV Ab: <0.1 s/co ratio (ref 0.0–0.9)
Hep A IgM: NEGATIVE
Hep B C IgM: NEGATIVE
Hepatitis B Surface Ag: NEGATIVE

## 2019-04-09 LAB — HIV ANTIBODY (ROUTINE TESTING W REFLEX): HIV Screen 4th Generation wRfx: NONREACTIVE

## 2019-04-09 LAB — BETA 2 MICROGLOBULIN, SERUM: Beta-2 Microglobulin: 1.9 mg/L (ref 0.6–2.4)

## 2019-04-10 ENCOUNTER — Telehealth: Payer: Self-pay | Admitting: Internal Medicine

## 2019-04-10 LAB — EPSTEIN BARR VRS(EBV DNA BY PCR)
EBV DNA QN by PCR: NEGATIVE copies/mL
log10 EBV DNA Qn PCR: UNDETERMINED log10 copy/mL

## 2019-04-10 NOTE — Telephone Encounter (Signed)
Called Heart and Vascular Dept and Pulmonary function dept. Unable to reach both departments.  Left message for both departments to contact pt to set up appts .

## 2019-04-11 ENCOUNTER — Other Ambulatory Visit: Payer: Self-pay | Admitting: Internal Medicine

## 2019-04-11 ENCOUNTER — Other Ambulatory Visit: Payer: Self-pay

## 2019-04-11 ENCOUNTER — Ambulatory Visit (HOSPITAL_COMMUNITY): Payer: Medicare Other

## 2019-04-11 ENCOUNTER — Ambulatory Visit (HOSPITAL_COMMUNITY)
Admission: RE | Admit: 2019-04-11 | Discharge: 2019-04-11 | Disposition: A | Discharge status: Home / Self Care | Payer: Medicare Other | Source: Ambulatory Visit | Attending: Internal Medicine | Admitting: Internal Medicine

## 2019-04-11 DIAGNOSIS — Z01812 Encounter for preprocedural laboratory examination: Secondary | ICD-10-CM | POA: Insufficient documentation

## 2019-04-11 DIAGNOSIS — Z0189 Encounter for other specified special examinations: Secondary | ICD-10-CM | POA: Diagnosis not present

## 2019-04-11 DIAGNOSIS — C8104 Nodular lymphocyte predominant Hodgkin lymphoma, lymph nodes of axilla and upper limb: Secondary | ICD-10-CM

## 2019-04-11 DIAGNOSIS — I083 Combined rheumatic disorders of mitral, aortic and tricuspid valves: Secondary | ICD-10-CM | POA: Diagnosis not present

## 2019-04-11 DIAGNOSIS — Z01810 Encounter for preprocedural cardiovascular examination: Secondary | ICD-10-CM | POA: Diagnosis present

## 2019-04-11 NOTE — Progress Notes (Signed)
Echocardiogram 2D Echocardiogram has been performed.  Oneal Deputy Svetlana Bagby 04/11/2019, 11:36 AM

## 2019-04-14 ENCOUNTER — Other Ambulatory Visit: Payer: Self-pay | Admitting: Radiology

## 2019-04-15 ENCOUNTER — Other Ambulatory Visit (HOSPITAL_COMMUNITY): Payer: Medicare Other

## 2019-04-15 ENCOUNTER — Ambulatory Visit (HOSPITAL_COMMUNITY)
Admission: RE | Admit: 2019-04-15 | Discharge: 2019-04-15 | Disposition: A | Discharge status: Home / Self Care | Payer: Medicare Other | Source: Ambulatory Visit | Attending: Internal Medicine | Admitting: Internal Medicine

## 2019-04-15 ENCOUNTER — Other Ambulatory Visit: Payer: Self-pay

## 2019-04-15 ENCOUNTER — Encounter (HOSPITAL_COMMUNITY): Payer: Self-pay

## 2019-04-15 DIAGNOSIS — Z7982 Long term (current) use of aspirin: Secondary | ICD-10-CM | POA: Insufficient documentation

## 2019-04-15 DIAGNOSIS — C8104 Nodular lymphocyte predominant Hodgkin lymphoma, lymph nodes of axilla and upper limb: Secondary | ICD-10-CM | POA: Diagnosis present

## 2019-04-15 DIAGNOSIS — I10 Essential (primary) hypertension: Secondary | ICD-10-CM | POA: Insufficient documentation

## 2019-04-15 DIAGNOSIS — Z7984 Long term (current) use of oral hypoglycemic drugs: Secondary | ICD-10-CM | POA: Diagnosis not present

## 2019-04-15 DIAGNOSIS — E119 Type 2 diabetes mellitus without complications: Secondary | ICD-10-CM | POA: Insufficient documentation

## 2019-04-15 DIAGNOSIS — Z79899 Other long term (current) drug therapy: Secondary | ICD-10-CM | POA: Insufficient documentation

## 2019-04-15 DIAGNOSIS — Z87891 Personal history of nicotine dependence: Secondary | ICD-10-CM | POA: Diagnosis not present

## 2019-04-15 LAB — GLUCOSE, CAPILLARY: Glucose-Capillary: 141 mg/dL — ABNORMAL HIGH (ref 70–99)

## 2019-04-15 LAB — CBC WITH DIFFERENTIAL/PLATELET
Abs Immature Granulocytes: 0.01 10*3/uL (ref 0.00–0.07)
Basophils Absolute: 0 10*3/uL (ref 0.0–0.1)
Basophils Relative: 1 %
Eosinophils Absolute: 0.4 10*3/uL (ref 0.0–0.5)
Eosinophils Relative: 9 %
HCT: 38.6 % (ref 36.0–46.0)
Hemoglobin: 12.5 g/dL (ref 12.0–15.0)
Immature Granulocytes: 0 %
Lymphocytes Relative: 31 %
Lymphs Abs: 1.3 10*3/uL (ref 0.7–4.0)
MCH: 28.1 pg (ref 26.0–34.0)
MCHC: 32.4 g/dL (ref 30.0–36.0)
MCV: 86.7 fL (ref 80.0–100.0)
Monocytes Absolute: 0.4 10*3/uL (ref 0.1–1.0)
Monocytes Relative: 10 %
Neutro Abs: 2.1 10*3/uL (ref 1.7–7.7)
Neutrophils Relative %: 49 %
Platelets: 205 10*3/uL (ref 150–400)
RBC: 4.45 MIL/uL (ref 3.87–5.11)
RDW: 12.4 % (ref 11.5–15.5)
WBC: 4.3 10*3/uL (ref 4.0–10.5)
nRBC: 0 % (ref 0.0–0.2)

## 2019-04-15 LAB — PROTIME-INR
INR: 1.1 (ref 0.8–1.2)
Prothrombin Time: 14 seconds (ref 11.4–15.2)

## 2019-04-15 MED ORDER — FLUMAZENIL 0.5 MG/5ML IV SOLN
INTRAVENOUS | Status: AC
  Filled 2019-04-15: qty 5

## 2019-04-15 MED ORDER — FENTANYL CITRATE (PF) 100 MCG/2ML IJ SOLN
INTRAMUSCULAR | Status: AC | PRN
  Administered 2019-04-15 (×2): 50 ug via INTRAVENOUS

## 2019-04-15 MED ORDER — MIDAZOLAM HCL 2 MG/2ML IJ SOLN
INTRAMUSCULAR | Status: AC
  Filled 2019-04-15: qty 4

## 2019-04-15 MED ORDER — MIDAZOLAM HCL 2 MG/2ML IJ SOLN
INTRAMUSCULAR | Status: AC | PRN
  Administered 2019-04-15 (×2): 0.5 mg via INTRAVENOUS
  Administered 2019-04-15: 1 mg via INTRAVENOUS

## 2019-04-15 MED ORDER — NALOXONE HCL 0.4 MG/ML IJ SOLN
INTRAMUSCULAR | Status: AC
  Filled 2019-04-15: qty 1

## 2019-04-15 MED ORDER — SODIUM CHLORIDE 0.9 % IV SOLN
INTRAVENOUS | Status: DC
  Administered 2019-04-15: 08:00:00 via INTRAVENOUS

## 2019-04-15 MED ORDER — FENTANYL CITRATE (PF) 100 MCG/2ML IJ SOLN
INTRAMUSCULAR | Status: AC
  Filled 2019-04-15: qty 2

## 2019-04-15 NOTE — Procedures (Signed)
Interventional Radiology Procedure:   Indications: Hodgkin lymphoma  Procedure: CT guided bone marrow biopsy  Findings: 2 aspirates and 1 core from right ilium  Complications: None     EBL: Minimal, less than 10 ml  Plan: Discharge to home in one hour.   Larayah Clute R. Anselm Pancoast, MD  Pager: [phone]

## 2019-04-15 NOTE — H&P (Signed)
Chief Complaint: Patient was seen in consultation today for bone marrow biopsy.  Referring Physician(s): Katherine Barnett,Katherine Barnett  Supervising Physician: Katherine Barnett  Patient Status: [facility] - Out-pt  History of Present Illness: Katherine Barnett is a 78 y.o. female with a past medical history significant for HTN, DM and recently diagnosed Hodgkin's lymphoma followed by Dr. Julien Barnett who presents today for a bone marrow biopsy. Briefly, Katherine Barnett underwent a routine mammogram on 12/26/18 which noted a possible mass in the left axilla, [place] later confirmed the presence of 4 hypoechoic masses likely representing lymph nodes. She then underwent US core biopsy of these lymph nodes which was suspicious for B-cell lymphoma or possible nodular lymphocyte predominant Hodgkin's lymphoma. She next underwent an excisional biopsy of the left axillary lymph nodes on 03/12/19 which pathology showed to be positive for Hodgkin's lymphoma. She was then seen by Dr. Julien Barnett on 04/08/19 and a bone marrow biopsy with PET scan to follow was ordered.  Patient denies any complaints today except for being anxious about the procedure. She is wondering if her cancer is curable and when she will find out what the treatment plan. She tells me she is here because she has cancer and "something is getting biopsied." After thorough discussion of procedure indications and risks patient wishes to proceed with bone marrow biopsy as planned.  Past Medical History:  Diagnosis Date  . Complication of anesthesia    vomited after colonoscopy  . Diabetes mellitus without complication ([place])    type 2 - diet controlled  . Hypertension     Past Surgical History:  Procedure Laterality Date  . AXILLARY LYMPH NODE BIOPSY Left 03/12/2019   Procedure: EXCISIONAL BIOPSY LEFT AXILLARY LYMPH NODES;  Surgeon: Katherine Keens, MD;  Location: [facility];  Service: General;  Laterality: Left;  . BREAST EXCISIONAL BIOPSY Left    Lt axillary bx, was benign   . COLONOSCOPY    . EYE SURGERY Bilateral    cataract surgery with lens implant  . ORIF HUMERUS FRACTURE Right 12/22/2014   Procedure: OPEN REDUCTION INTERNAL FIXATION (ORIF) RIGHT DISTAL HUMERUS FRACTURE;  Surgeon: Katherine Rossetti, MD;  Location: [facility];  Service: Orthopedics;  Laterality: Right;    Allergies: Patient has no known allergies.  Medications: Prior to Admission medications   Medication Sig Start Date End Date Taking? Authorizing Provider  losartan-hydrochlorothiazide (HYZAAR) 50-12.5 MG per tablet Take 1 tablet by mouth daily.   Yes [provider]  metFORMIN (GLUCOPHAGE) 500 MG tablet Take 500 mg by mouth daily.   Yes [provider]  simvastatin (ZOCOR) 20 MG tablet Take 20 mg by mouth at bedtime.   Yes [provider]  traMADol (ULTRAM) 50 MG tablet Take 1 tablet (50 mg total) by mouth every 6 (six) hours as needed for moderate pain or severe pain. 03/12/19  Yes Katherine Keens, MD  valACYclovir (VALTREX) 1000 MG tablet Take 500 mg by mouth 2 (two) times daily as needed (fever blisters).  01/08/19  Yes [provider]  aspirin 81 MG tablet Take 81 mg by mouth daily.    [provider]  calcium-vitamin D (OSCAL WITH D) 500-200 MG-UNIT per tablet Take 1 tablet by mouth daily with breakfast.    [provider]     No family history on file.  Social History   Socioeconomic History  . Marital status: Widowed    Spouse name: Not on file  . Number of children: Not on file  . Years of education: Not  on file  . Highest education level: Not on file  Occupational History  . Not on file  Social Needs  . Financial resource strain: Not on file  . Food insecurity    Worry: Not on file    Inability: Not on file  . Transportation needs    Medical: Not on file    Non-medical: Not on file  Tobacco Use  . Smoking status: Former Smoker    Quit date: 12/17/1984    Years since quitting: 34.3  . Smokeless tobacco: Never  Used  Substance and Sexual Activity  . Alcohol use: No  . Drug use: No  . Sexual activity: Not on file  Lifestyle  . Physical activity    Days per week: Not on file    Minutes per session: Not on file  . Stress: Not on file  Relationships  . Social Herbalist on phone: Not on file    Gets together: Not on file    Attends religious service: Not on file    Active member of club or organization: Not on file    Attends meetings of clubs or organizations: Not on file    Relationship status: Not on file  Other Topics Concern  . Not on file  Social History Narrative  . Not on file     Review of Systems: A 12 point ROS discussed and pertinent positives are indicated in the HPI above.  All other systems are negative.  Review of Systems  Constitutional: Negative for appetite change, chills and fever.  HENT: Negative for nosebleeds.   Respiratory: Negative for cough and shortness of breath.   Cardiovascular: Negative for chest pain.  Gastrointestinal: Negative for abdominal pain, blood in stool, diarrhea, nausea and vomiting.  Genitourinary: Negative for hematuria.  Musculoskeletal: Negative for back pain.  Skin: Negative for rash and wound.  Neurological: Negative for dizziness, syncope and headaches.  Psychiatric/Behavioral: The patient is nervous/anxious.     Vital Signs: BP 115/77 (BP Location: Right Arm)   Pulse 90   Temp (!) 97.5 F (36.4 C) (Oral)   Resp 18   Ht '4\' 11"'  (1.499 m)   Wt 145 lb (65.8 kg)   SpO2 98%   BMI 29.29 kg/m   Physical Exam Vitals signs reviewed.  Constitutional:      General: She is not in acute distress. HENT:     Head: Normocephalic.     Mouth/Throat:     Mouth: Mucous membranes are moist.     Pharynx: Oropharynx is clear. No oropharyngeal exudate or posterior oropharyngeal erythema.  Cardiovascular:     Rate and Rhythm: Normal rate and regular rhythm.  Pulmonary:     Effort: Pulmonary effort is normal.     Breath sounds:  Normal breath sounds.  Abdominal:     General: Bowel sounds are normal. There is no distension.     Palpations: Abdomen is soft.     Tenderness: There is no abdominal tenderness.  Skin:    General: Skin is warm and dry.  Neurological:     Mental Status: She is alert and oriented to person, place, and time.  Psychiatric:        Mood and Affect: Mood normal.        Behavior: Behavior normal.        Thought Content: Thought content normal.        Judgment: Judgment normal.      MD Evaluation Airway: WNL Heart: WNL Abdomen: WNL  Chest/ Lungs: WNL ASA  Classification: 3 Mallampati/Airway Score: Two   Imaging: No results found.  Labs:  CBC: Recent Labs    03/05/19 1005 04/08/19 1403 04/15/19 0730  WBC 4.2 5.0 4.3  HGB 13.0 12.6 12.5  HCT 40.9 39.2 38.6  PLT 208 199 205    COAGS: Recent Labs    04/15/19 0730  INR 1.1    BMP: Recent Labs    03/05/19 1005 04/08/19 1403  NA 140 141  K 3.7 3.8  CL 110 105  CO2 20* 27  GLUCOSE 202* 80  BUN 15 17  CALCIUM 9.6 9.7  CREATININE 0.78 0.86  GFRNONAA >60 >60  GFRAA >60 >60    LIVER FUNCTION TESTS: Recent Labs    04/08/19 1403  BILITOT 0.5  AST 16  ALT 17  ALKPHOS 81  PROT 7.3  ALBUMIN 4.5    TUMOR MARKERS: No results for input(s): AFPTM, CEA, CA199, CHROMGRNA in the last 8760 hours.  Assessment and Plan:  78 y/o F with recently diagnosed Hodgkin's lymphoma followed by Dr. Julien Barnett who presents today for a bone marrow biopsy to further direct treatment.  Patient has been NPO since 11 pm, she does not take blood thinning medications. Afebrile, WBC 4.3, hgb 12.5, plt 205, INR 1.1.  Risks and benefits of bone marrow biopsy was discussed with the patient and/or patient's family including, but not limited to bleeding, infection, damage to adjacent structures or low yield requiring additional tests.  All of the questions were answered and there is agreement to proceed.  Consent signed and in chart.    Thank you for this interesting consult.  I greatly enjoyed meeting [employer] and look forward to participating in their care.  A copy of this report was sent to the requesting provider on this date.  Electronically Signed: Joaquim Nam, PA-C 04/15/2019, 8:14 AM   I spent a total of  15 Minutes in face to face in clinical consultation, greater than 50% of which was counseling/coordinating care for bone marrow biopsy.

## 2019-04-15 NOTE — Discharge Instructions (Addendum)
Bone Marrow Aspiration and Bone Marrow Biopsy, Adult, Care After This sheet gives you information about how to care for yourself after your procedure. Your health care provider may also give you more specific instructions. If you have problems or questions, contact your health care provider. What can I expect after the procedure? After the procedure, it is common to have:  Mild pain and tenderness.  Swelling.  Bruising. Follow these instructions at home: Puncture site care      Follow instructions from your health care provider about how to take care of the puncture site. Make sure you: ? Wash your hands with soap and water before you change your bandage (dressing). If soap and water are not available, use hand sanitizer. ? Change your dressing as told by your health care provider.  You may remove your dressing tomorrow.  Check your puncture siteevery day for signs of infection. Check for: ? More redness, swelling, or pain. ? More fluid or blood. ? Warmth. ? Pus or a bad smell. General instructions  Take over-the-counter and prescription medicines only as told by your health care provider.  Do not take baths, swim, or use a hot tub until your health care provider approves. Ask if you can take a shower or have a sponge bath.  You may shower tomorrow.  Return to your normal activities as told by your health care provider. Ask your health care provider what activities are safe for you.  Do not drive for 24 hours if you were given a medicine to help you relax (sedative) during your procedure.  Keep all follow-up visits as told by your health care provider. This is important. Contact a health care provider if:  Your pain is not controlled with medicine. Get help right away if:  You have a fever.  You have more redness, swelling, or pain around the puncture site.  You have more fluid or blood coming from the puncture site.  Your puncture site feels warm to the touch.  You  have pus or a bad smell coming from the puncture site. These symptoms may represent a serious problem that is an emergency. Do not wait to see if the symptoms will go away. Get medical help right away. Call your local emergency services (911 in the [place] Do not drive yourself to the hospital. Summary  After the procedure, it is common to have mild pain, tenderness, swelling, and bruising.  Follow instructions from your health care provider about how to take care of the puncture site.  Get help right away if you have any symptoms of infection or if you have more blood or fluid coming from the puncture site. This information is not intended to replace advice given to you by your health care provider. Make sure you discuss any questions you have with your health care provider. Document Released: 01/27/2005 Document Revised: 10/23/2017 Document Reviewed: 12/22/2015 Elsevier Patient Education  [place].   Moderate Conscious Sedation, Adult, Care After These instructions provide you with information about caring for yourself after your procedure. Your health care provider may also give you more specific instructions. Your treatment has been planned according to current medical practices, but problems sometimes occur. Call your health care provider if you have any problems or questions after your procedure. What can I expect after the procedure? After your procedure, it is common:  To feel sleepy for several hours.  To feel clumsy and have poor balance for several hours.  To have poor judgment for  several hours.  To vomit if you eat too soon. Follow these instructions at home: For at least 24 hours after the procedure:   Do not: ? Participate in activities where you could fall or become injured. ? Drive. ? Use heavy machinery. ? Drink alcohol. ? Take sleeping pills or medicines that cause drowsiness. ? Make important decisions or sign legal documents. ? Take care of children  on your own.  Rest. Eating and drinking  Follow the diet recommended by your health care provider.  If you vomit: ? Drink water, juice, or soup when you can drink without vomiting. ? Make sure you have little or no nausea before eating solid foods. General instructions  Have a responsible adult stay with you until you are awake and alert.  Take over-the-counter and prescription medicines only as told by your health care provider.  If you smoke, do not smoke without supervision.  Keep all follow-up visits as told by your health care provider. This is important. Contact a health care provider if:  You keep feeling nauseous or you keep vomiting.  You feel light-headed.  You develop a rash.  You have a fever. Get help right away if:  You have trouble breathing. This information is not intended to replace advice given to you by your health care provider. Make sure you discuss any questions you have with your health care provider. Document Released: 04/30/2013 Document Revised: 06/22/2017 Document Reviewed: 10/30/2015 Elsevier Patient Education  2020 [employer].

## 2019-04-16 LAB — SURGICAL PATHOLOGY

## 2019-04-17 ENCOUNTER — Ambulatory Visit (HOSPITAL_COMMUNITY)
Admission: RE | Admit: 2019-04-17 | Discharge: 2019-04-17 | Disposition: A | Discharge status: Home / Self Care | Payer: Medicare Other | Source: Ambulatory Visit | Attending: Internal Medicine | Admitting: Internal Medicine

## 2019-04-17 ENCOUNTER — Other Ambulatory Visit: Payer: Self-pay

## 2019-04-17 DIAGNOSIS — E119 Type 2 diabetes mellitus without complications: Secondary | ICD-10-CM | POA: Diagnosis not present

## 2019-04-17 DIAGNOSIS — Z79899 Other long term (current) drug therapy: Secondary | ICD-10-CM | POA: Diagnosis not present

## 2019-04-17 DIAGNOSIS — Z7984 Long term (current) use of oral hypoglycemic drugs: Secondary | ICD-10-CM | POA: Insufficient documentation

## 2019-04-17 DIAGNOSIS — Z7982 Long term (current) use of aspirin: Secondary | ICD-10-CM | POA: Diagnosis not present

## 2019-04-17 DIAGNOSIS — C8104 Nodular lymphocyte predominant Hodgkin lymphoma, lymph nodes of axilla and upper limb: Secondary | ICD-10-CM | POA: Insufficient documentation

## 2019-04-17 DIAGNOSIS — I10 Essential (primary) hypertension: Secondary | ICD-10-CM | POA: Insufficient documentation

## 2019-04-17 LAB — GLUCOSE, CAPILLARY: Glucose-Capillary: 149 mg/dL — ABNORMAL HIGH (ref 70–99)

## 2019-04-17 MED ORDER — FLUDEOXYGLUCOSE F - 18 (FDG) INJECTION
7.0000 | Freq: Once | INTRAVENOUS | Status: AC
  Administered 2019-04-17: 10:00:00 7 via INTRAVENOUS

## 2019-04-22 ENCOUNTER — Inpatient Hospital Stay (HOSPITAL_BASED_OUTPATIENT_CLINIC_OR_DEPARTMENT_OTHER): Payer: Medicare Other | Admitting: Internal Medicine

## 2019-04-22 ENCOUNTER — Other Ambulatory Visit: Payer: Self-pay

## 2019-04-22 ENCOUNTER — Encounter: Payer: Self-pay | Admitting: Internal Medicine

## 2019-04-22 ENCOUNTER — Encounter (HOSPITAL_COMMUNITY): Payer: Self-pay | Admitting: Internal Medicine

## 2019-04-22 VITALS — BP 119/73 | HR 99 | Temp 98.2°F | Resp 18 | Ht 59.0 in | Wt 141.0 lb

## 2019-04-22 DIAGNOSIS — C8104 Nodular lymphocyte predominant Hodgkin lymphoma, lymph nodes of axilla and upper limb: Secondary | ICD-10-CM

## 2019-04-22 MED ORDER — ALLOPURINOL 100 MG PO TABS: 100.0000 mg | ORAL_TABLET | Freq: Two times a day (BID) | ORAL | 2 refills | Status: DC

## 2019-04-22 MED ORDER — PROCHLORPERAZINE MALEATE 10 MG PO TABS: 10.0000 mg | ORAL_TABLET | Freq: Four times a day (QID) | ORAL | 0 refills | Status: DC | PRN

## 2019-04-22 NOTE — Progress Notes (Signed)
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  PRIOR THERAPY: None.  CURRENT THERAPY: Weekly Rituxan 375 mg/M2 for 4 weeks.  First dose expected April 29, 2019.  INTERVAL HISTORY: Katherine Barnett 78 y.o. female returns to the clinic today for follow-up visit.  The patient is feeling fine today with no concerning complaints except for mild pain in the left axilla.  She denied having any current chest pain, shortness of breath, cough or hemoptysis.  She denied having any fever or chills.  She has no nausea, vomiting, diarrhea or constipation.  She denied having any significant weight loss or night sweats.  She has no headache or visual changes.  She underwent several studies recently including a PET scan as well as cardiac and pulmonary function test.  She also has a bone marrow biopsy and aspirate.  The patient is here today for evaluation and recommendation regarding treatment of her condition.  MEDICAL HISTORY: Past Medical History:  Diagnosis Date   Complication of anesthesia    vomited after colonoscopy   Diabetes mellitus without complication ([place])    type 2 - diet controlled   Hypertension     ALLERGIES:  has No Known Allergies.  MEDICATIONS:  Current Outpatient Medications  Medication Sig Dispense Refill   aspirin 81 MG tablet Take 81 mg by mouth daily.     calcium-vitamin D (OSCAL WITH D) 500-200 MG-UNIT per tablet Take 1 tablet by mouth daily with breakfast.     losartan-hydrochlorothiazide (HYZAAR) 50-12.5 MG per tablet Take 1 tablet by mouth daily.     metFORMIN (GLUCOPHAGE) 500 MG tablet Take 500 mg by mouth  daily.     simvastatin (ZOCOR) 20 MG tablet Take 20 mg by mouth at bedtime.     traMADol (ULTRAM) 50 MG tablet Take 1 tablet (50 mg total) by mouth every 6 (six) hours as needed for moderate pain or severe pain. 25 tablet 0   valACYclovir (VALTREX) 1000 MG tablet Take 500 mg by mouth 2 (two) times daily as needed (fever blisters).      No current facility-administered medications for this visit.     SURGICAL HISTORY:  Past Surgical History:  Procedure Laterality Date   AXILLARY LYMPH NODE BIOPSY Left 03/12/2019   Procedure: EXCISIONAL BIOPSY LEFT AXILLARY LYMPH NODES;  Surgeon: Coralie Keens, MD;  Location: [facility];  Service: General;  Laterality: Left;   BREAST EXCISIONAL BIOPSY Left    Lt axillary bx, was benign   COLONOSCOPY     EYE SURGERY Bilateral    cataract surgery with lens implant   ORIF HUMERUS FRACTURE Right 12/22/2014   Procedure: OPEN REDUCTION INTERNAL FIXATION (ORIF) RIGHT DISTAL HUMERUS FRACTURE;  Surgeon: Mcarthur Rossetti, MD;  Location: [facility];  Service: Orthopedics;  Laterality: Right;    REVIEW OF SYSTEMS:  Constitutional: negative Eyes: negative Ears, nose, mouth, throat, and face: negative Respiratory: negative Cardiovascular: negative Gastrointestinal: negative Genitourinary:negative Integument/breast: negative Hematologic/lymphatic: negative Musculoskeletal:negative Neurological: negative Behavioral/Psych: negative Endocrine: negative Allergic/Immunologic: negative   PHYSICAL EXAMINATION: General appearance: alert, cooperative and no distress Head: Normocephalic, without obvious abnormality, atraumatic Neck: no adenopathy, no JVD, supple, symmetrical, trachea midline and thyroid not enlarged, symmetric, no tenderness/mass/nodules Lymph  nodes: Cervical, supraclavicular, and axillary nodes normal. Resp: clear to auscultation bilaterally Back: symmetric, no curvature. ROM normal. No CVA tenderness. Cardio: regular rate and rhythm, S1, S2  normal, no murmur, click, rub or gallop GI: soft, non-tender; bowel sounds normal; no masses,  no organomegaly Extremities: extremities normal, atraumatic, no cyanosis or edema Neurologic: Alert and oriented X 3, normal strength and tone. Normal symmetric reflexes. Normal coordination and gait  ECOG PERFORMANCE STATUS: 1 - Symptomatic but completely ambulatory  Blood pressure 119/73, pulse 99, temperature 98.2 F (36.8 C), temperature source Oral, resp. rate 18, height '4\' 11"'  (1.499 m), weight 141 lb (64 kg), SpO2 100 %.  LABORATORY DATA: Lab Results  Component Value Date   WBC 4.3 04/15/2019   HGB 12.5 04/15/2019   HCT 38.6 04/15/2019   MCV 86.7 04/15/2019   PLT 205 04/15/2019      Chemistry      Component Value Date/Time   NA 141 04/08/2019 1403   K 3.8 04/08/2019 1403   CL 105 04/08/2019 1403   CO2 27 04/08/2019 1403   BUN 17 04/08/2019 1403   CREATININE 0.86 04/08/2019 1403      Component Value Date/Time   CALCIUM 9.7 04/08/2019 1403   ALKPHOS 81 04/08/2019 1403   AST 16 04/08/2019 1403   ALT 17 04/08/2019 1403   BILITOT 0.5 04/08/2019 1403       RADIOGRAPHIC STUDIES: Nm Pet Image Initial (pi) Skull Base To Thigh  Result Date: 04/17/2019 CLINICAL DATA:  Initial treatment strategy for lymphoma. EXAM: NUCLEAR MEDICINE PET SKULL BASE TO THIGH TECHNIQUE: 7.0 mCi F-18 FDG was injected intravenously. Full-ring PET imaging was performed from the skull base to thigh after the radiotracer. CT data was obtained and used for attenuation correction and anatomic localization. Fasting blood glucose: 149 mg/dl COMPARISON:  None FINDINGS: Mediastinal blood pool activity: SUV max 2.96 Liver activity: SUV max 4.27 NECK: No hypermetabolic lymph nodes in the neck. Incidental CT findings: none CHEST: Enlarged left retropectoral node measures 3.5 x 1.9 cm within SUV max of 7.3. Left axillary lymph node measures 2.3 x 1.6 cm with SUV max of 8.6. Postsurgical change within the left axilla with  surgical clips noted. No enlarged or FDG avid supraclavicular or right axillary lymph nodes. Pre-vascular lymph node anterior to the aortic hour measures 1.2 cm without significant FDG uptake. Adjacent left pre-vascular node measures 1.1 cm within SUV max of 3.5. No hypermetabolic hilar lymph nodes. No pleural effusion identified. No suspicious hypermetabolic lung nodules. Incidental CT findings: Tiny nonspecific nodule in the anterior right upper lobe measures 3 mm, image 23/8. ABDOMEN/PELVIS: No abnormal hypermetabolic activity within the liver, pancreas, adrenal glands, or spleen. No hypermetabolic lymph nodes in the abdomen or pelvis. The spleen is normal in size measuring 6.9 cm and has an SUV max of 3.37. Incidental CT findings: Aortic atherosclerosis.  No aneurysm. SKELETON: No focal hypermetabolic activity to suggest skeletal metastasis. Incidental CT findings: none IMPRESSION: 1. Enlarged and FDG avid left retropectoral and left axillary lymph nodes compatible with the history of lymphoma. 2. Borderline enlarged left pre-vascular lymph nodes within the anterior mediastinum exhibit either no uptake or mild uptake less than liver. 3. No mass or adenopathy within the abdomen or pelvis. Normal size spleen. 4.  Aortic Atherosclerosis (ICD10-I70.0). Electronically Signed   By: Kerby Moors M.D.   On: 04/17/2019 11:56   Ct Biopsy  Result Date: 04/15/2019 INDICATION: 78 year old with Hodgkin lymphoma. EXAM: CT GUIDED BONE MARROW ASPIRATES AND BIOPSY Physician:  Adam R. Anselm Pancoast, MD MEDICATIONS: None. ANESTHESIA/SEDATION: Fentanyl 100 mcg IV; Versed 2.0 mg IV Moderate Sedation Time:  10 minutes The patient was continuously monitored during the procedure by the interventional radiology nurse under my direct supervision. COMPLICATIONS: None immediate. PROCEDURE: The procedure was explained to the patient. The risks and benefits of the procedure were discussed and the patient's questions were addressed. Informed  consent was obtained from the patient. The patient was placed prone on CT table. Images of the pelvis were obtained. The right side of back was prepped and draped in sterile fashion. The skin and right posterior ilium were anesthetized with 1% lidocaine. 11 gauge bone needle was directed into the right ilium with CT guidance. Two aspirates and one core biopsy were obtained. Bandage placed over the puncture site. IMPRESSION: CT guided bone marrow aspiration and core biopsy. Electronically Signed   By: Markus Daft M.D.   On: 04/15/2019 12:46   Ct Bone Marrow Biopsy & Aspiration  Result Date: 04/15/2019 INDICATION: 78 year old with Hodgkin lymphoma. EXAM: CT GUIDED BONE MARROW ASPIRATES AND BIOPSY Physician: Stephan Minister. Anselm Pancoast, MD MEDICATIONS: None. ANESTHESIA/SEDATION: Fentanyl 100 mcg IV; Versed 2.0 mg IV Moderate Sedation Time:  10 minutes The patient was continuously monitored during the procedure by the interventional radiology nurse under my direct supervision. COMPLICATIONS: None immediate. PROCEDURE: The procedure was explained to the patient. The risks and benefits of the procedure were discussed and the patient's questions were addressed. Informed consent was obtained from the patient. The patient was placed prone on CT table. Images of the pelvis were obtained. The right side of back was prepped and draped in sterile fashion. The skin and right posterior ilium were anesthetized with 1% lidocaine. 11 gauge bone needle was directed into the right ilium with CT guidance. Two aspirates and one core biopsy were obtained. Bandage placed over the puncture site. IMPRESSION: CT guided bone marrow aspiration and core biopsy. Electronically Signed   By: Markus Daft M.D.   On: 04/15/2019 12:46    ASSESSMENT AND PLAN: This is a very pleasant 78 years old African-American female recently diagnosed with a stage Ia non-bulky Hodgkin lymphoma, lymphocyte predominant subtype diagnosed in September 2020 and presented with  enlarged and hypermetabolic left retropectoral and left axillary lymph nodes. I had a lengthy discussion with the patient today about her current condition and treatment options.  The patient is mildly symptomatic with mild pain in the left axilla. I discussed with the patient her treatment options and because she has the lymphocyte predominant subtype, we discussed with her the option of just curative radiotherapy versus proceeding with 4 weekly doses of Rituxan followed by radiation if there is any residual disease. The patient agreed to the current plan and she is expected to start the first dose of her treatment with Rituxan next week. We will see her back for follow-up visit in 2 weeks for evaluation and management of any adverse effect of her treatment. She was advised to call immediately if she has any concerning symptoms in the interval. The patient voices understanding of current disease status and treatment options and is in agreement with the current care plan.  All questions were answered. The patient knows to call the clinic with any problems, questions or concerns. We can certainly see the patient much sooner if necessary.  I spent 15 minutes counseling the patient face to face. The total time spent in the appointment was 25 minutes.  Disclaimer: This note was dictated with voice recognition software.  Similar sounding words can inadvertently be transcribed and may not be corrected upon review.

## 2019-04-22 NOTE — Progress Notes (Addendum)
START ON PATHWAY REGIMEN - Lymphoma and CLL  Disease Type: Nodular Lymphocyte Predominant Hodgkin Lymphoma Line of therapy: First Line [place] Stage: IIA Disease Characteristics: Bulky or Non-contiguous Disease Intent of Therapy: Curative Intent, Discussed with Patient  This treatment plan was discontinued and the patient will be treated with 4 doses of weekly Rituxan.

## 2019-04-28 ENCOUNTER — Inpatient Hospital Stay: Payer: Medicare Other | Attending: Internal Medicine

## 2019-04-28 ENCOUNTER — Other Ambulatory Visit: Payer: Self-pay

## 2019-04-28 ENCOUNTER — Other Ambulatory Visit: Payer: Medicare Other

## 2019-04-28 DIAGNOSIS — C8104 Nodular lymphocyte predominant Hodgkin lymphoma, lymph nodes of axilla and upper limb: Secondary | ICD-10-CM | POA: Insufficient documentation

## 2019-04-28 DIAGNOSIS — Z5112 Encounter for antineoplastic immunotherapy: Secondary | ICD-10-CM | POA: Insufficient documentation

## 2019-04-28 DIAGNOSIS — I10 Essential (primary) hypertension: Secondary | ICD-10-CM | POA: Insufficient documentation

## 2019-04-28 DIAGNOSIS — E119 Type 2 diabetes mellitus without complications: Secondary | ICD-10-CM | POA: Insufficient documentation

## 2019-04-29 ENCOUNTER — Telehealth: Payer: Self-pay | Admitting: Internal Medicine

## 2019-04-29 NOTE — Telephone Encounter (Signed)
Called patient regarding upcoming appointments, patient is notified and will receive calender the next time she comes in.

## 2019-04-30 ENCOUNTER — Encounter: Payer: Self-pay | Admitting: Internal Medicine

## 2019-04-30 ENCOUNTER — Other Ambulatory Visit: Payer: Self-pay

## 2019-04-30 ENCOUNTER — Inpatient Hospital Stay: Payer: Medicare Other

## 2019-04-30 VITALS — BP 117/72 | HR 102 | Temp 98.3°F | Resp 22

## 2019-04-30 DIAGNOSIS — I10 Essential (primary) hypertension: Secondary | ICD-10-CM | POA: Diagnosis not present

## 2019-04-30 DIAGNOSIS — C8104 Nodular lymphocyte predominant Hodgkin lymphoma, lymph nodes of axilla and upper limb: Secondary | ICD-10-CM

## 2019-04-30 DIAGNOSIS — Z5112 Encounter for antineoplastic immunotherapy: Secondary | ICD-10-CM | POA: Diagnosis not present

## 2019-04-30 DIAGNOSIS — E119 Type 2 diabetes mellitus without complications: Secondary | ICD-10-CM | POA: Diagnosis not present

## 2019-04-30 LAB — CMP (CANCER CENTER ONLY)
ALT: 15 U/L (ref 0–44)
AST: 14 U/L — ABNORMAL LOW (ref 15–41)
Albumin: 4.1 g/dL (ref 3.5–5.0)
Alkaline Phosphatase: 80 U/L (ref 38–126)
Anion gap: 9 (ref 5–15)
BUN: 18 mg/dL (ref 8–23)
CO2: 26 mmol/L (ref 22–32)
Calcium: 9.7 mg/dL (ref 8.9–10.3)
Chloride: 109 mmol/L (ref 98–111)
Creatinine: 0.8 mg/dL (ref 0.44–1.00)
GFR, Est AFR Am: >60 mL/min (ref >60)
GFR, Estimated: >60 mL/min (ref >60)
Glucose, Bld: 85 mg/dL (ref 70–99)
Potassium: 3.8 mmol/L (ref 3.5–5.1)
Sodium: 144 mmol/L (ref 135–145)
Total Bilirubin: 0.5 mg/dL (ref 0.3–1.2)
Total Protein: 6.9 g/dL (ref 6.5–8.1)

## 2019-04-30 LAB — CBC WITH DIFFERENTIAL (CANCER CENTER ONLY)
Abs Immature Granulocytes: 0.01 10*3/uL (ref 0.00–0.07)
Basophils Absolute: 0 10*3/uL (ref 0.0–0.1)
Basophils Relative: 0 %
Eosinophils Absolute: 0.4 10*3/uL (ref 0.0–0.5)
Eosinophils Relative: 8 %
HCT: 38 % (ref 36.0–46.0)
Hemoglobin: 12.2 g/dL (ref 12.0–15.0)
Immature Granulocytes: 0 %
Lymphocytes Relative: 27 %
Lymphs Abs: 1.3 10*3/uL (ref 0.7–4.0)
MCH: 27.4 pg (ref 26.0–34.0)
MCHC: 32.1 g/dL (ref 30.0–36.0)
MCV: 85.2 fL (ref 80.0–100.0)
Monocytes Absolute: 0.6 10*3/uL (ref 0.1–1.0)
Monocytes Relative: 13 %
Neutro Abs: 2.4 10*3/uL (ref 1.7–7.7)
Neutrophils Relative %: 52 %
Platelet Count: 211 10*3/uL (ref 150–400)
RBC: 4.46 MIL/uL (ref 3.87–5.11)
RDW: 12.6 % (ref 11.5–15.5)
WBC Count: 4.6 10*3/uL (ref 4.0–10.5)
nRBC: 0 % (ref 0.0–0.2)

## 2019-04-30 LAB — URIC ACID: Uric Acid, Serum: 4.7 mg/dL (ref 2.5–7.1)

## 2019-04-30 MED ORDER — SODIUM CHLORIDE 0.9 % IV SOLN
Freq: Once | INTRAVENOUS | Status: AC
  Administered 2019-04-30: 13:00:00 via INTRAVENOUS
  Filled 2019-04-30: qty 250

## 2019-04-30 MED ORDER — DIPHENHYDRAMINE HCL 25 MG PO CAPS
50.0000 mg | ORAL_CAPSULE | Freq: Once | ORAL | Status: AC
  Administered 2019-04-30: 50 mg via ORAL

## 2019-04-30 MED ORDER — SODIUM CHLORIDE 0.9 % IV SOLN
375.0000 mg/m2 | Freq: Once | INTRAVENOUS | Status: AC
  Administered 2019-04-30: 600 mg via INTRAVENOUS
  Filled 2019-04-30: qty 10

## 2019-04-30 MED ORDER — ACETAMINOPHEN 325 MG PO TABS
ORAL_TABLET | ORAL | Status: AC
  Filled 2019-04-30: qty 2

## 2019-04-30 MED ORDER — ACETAMINOPHEN 325 MG PO TABS
650.0000 mg | ORAL_TABLET | Freq: Once | ORAL | Status: AC
  Administered 2019-04-30: 650 mg via ORAL

## 2019-04-30 MED ORDER — DIPHENHYDRAMINE HCL 25 MG PO CAPS
ORAL_CAPSULE | ORAL | Status: AC
  Filled 2019-04-30: qty 2

## 2019-04-30 NOTE — Progress Notes (Signed)
Met with patient in treatment area to introduce myself as Arboriculturist and to offer available resources.  Discussed one-time $27 Engineer, drilling to assist with personal expenses while going through treatment. She verbalized understanding.  Gave patient my card if interested in applying and for any additional financial questions or concerns.

## 2019-04-30 NOTE — Patient Instructions (Signed)
[facility] Discharge Instructions for Patients Receiving Chemotherapy  Today you received the following chemotherapy agents: Rituximab  To help prevent nausea and vomiting after your treatment, we encourage you to take your nausea medication as directed.   If you develop nausea and vomiting that is not controlled by your nausea medication, call the clinic.   BELOW ARE SYMPTOMS THAT SHOULD BE REPORTED IMMEDIATELY:  *FEVER GREATER THAN 100.5 F  *CHILLS WITH OR WITHOUT FEVER  NAUSEA AND VOMITING THAT IS NOT CONTROLLED WITH YOUR NAUSEA MEDICATION  *UNUSUAL SHORTNESS OF BREATH  *UNUSUAL BRUISING OR BLEEDING  TENDERNESS IN MOUTH AND THROAT WITH OR WITHOUT PRESENCE OF ULCERS  *URINARY PROBLEMS  *BOWEL PROBLEMS  UNUSUAL RASH Items with * indicate a potential emergency and should be followed up as soon as possible.  Feel free to call the clinic should you have any questions or concerns. The clinic phone number is [phone].  Please show the CHEMO ALERT CARD at check-in to the Emergency Department and triage nurse.  Rituximab injection What is this medicine? RITUXIMAB (ri TUX i mab) is a monoclonal antibody. It is used to treat certain types of cancer like non-Hodgkin lymphoma and chronic lymphocytic leukemia. It is also used to treat rheumatoid arthritis, granulomatosis with polyangiitis (or Wegener's granulomatosis), microscopic polyangiitis, and pemphigus vulgaris. This medicine may be used for other purposes; ask your health care provider or pharmacist if you have questions. COMMON BRAND NAME(S): Rituxan, RUXIENCE What should I tell my health care provider before I take this medicine? They need to know if you have any of these conditions:  heart disease  infection (especially a virus infection such as hepatitis B, chickenpox, cold sores, or herpes)  immune system problems  irregular heartbeat  kidney disease  low blood counts, like low white cell,  platelet, or red cell counts  lung or breathing disease, like asthma  recently received or scheduled to receive a vaccine  an unusual or allergic reaction to rituximab, other medicines, foods, dyes, or preservatives  pregnant or trying to get pregnant  breast-feeding How should I use this medicine? This medicine is for infusion into a vein. It is administered in a hospital or clinic by a specially trained health care professional. A special MedGuide will be given to you by the pharmacist with each prescription and refill. Be sure to read this information carefully each time. Talk to your pediatrician regarding the use of this medicine in children. This medicine is not approved for use in children. Overdosage: If you think you have taken too much of this medicine contact a poison control center or emergency room at once. NOTE: This medicine is only for you. Do not share this medicine with others. What if I miss a dose? It is important not to miss a dose. Call your doctor or health care professional if you are unable to keep an appointment. What may interact with this medicine?  cisplatin  live virus vaccines This list may not describe all possible interactions. Give your health care provider a list of all the medicines, herbs, non-prescription drugs, or dietary supplements you use. Also tell them if you smoke, drink alcohol, or use illegal drugs. Some items may interact with your medicine. What should I watch for while using this medicine? Your condition will be monitored carefully while you are receiving this medicine. You may need blood work done while you are taking this medicine. This medicine can cause serious allergic reactions. To reduce your risk you may   need to take medicine before treatment with this medicine. Take your medicine as directed. In some patients, this medicine may cause a serious brain infection that may cause death. If you have any problems seeing, thinking,  speaking, walking, or standing, tell your healthcare professional right away. If you cannot reach your healthcare professional, urgently seek other source of medical care. Call your doctor or health care professional for advice if you get a fever, chills or sore throat, or other symptoms of a cold or flu. Do not treat yourself. This drug decreases your body's ability to fight infections. Try to avoid being around people who are sick. Do not become pregnant while taking this medicine or for at least 12 months after stopping it. Women should inform their doctor if they wish to become pregnant or think they might be pregnant. There is a potential for serious side effects to an unborn child. Talk to your health care professional or pharmacist for more information. Do not breast-feed an infant while taking this medicine or for at least 6 months after stopping it. What side effects may I notice from receiving this medicine? Side effects that you should report to your doctor or health care professional as soon as possible:  allergic reactions like skin rash, itching or hives; swelling of the face, lips, or tongue  breathing problems  chest pain  changes in vision  diarrhea  headache with fever, neck stiffness, sensitivity to light, nausea, or confusion  fast, irregular heartbeat  loss of memory  low blood counts - this medicine may decrease the number of white blood cells, red blood cells and platelets. You may be at increased risk for infections and bleeding.  mouth sores  problems with balance, talking, or walking  redness, blistering, peeling or loosening of the skin, including inside the mouth  signs of infection - fever or chills, cough, sore throat, pain or difficulty passing urine  signs and symptoms of kidney injury like trouble passing urine or change in the amount of urine  signs and symptoms of liver injury like dark yellow or brown urine; general ill feeling or flu-like  symptoms; light-colored stools; loss of appetite; nausea; right upper belly pain; unusually weak or tired; yellowing of the eyes or skin  signs and symptoms of low blood pressure like dizziness; feeling faint or lightheaded, falls; unusually weak or tired  stomach pain  swelling of the ankles, feet, hands  unusual bleeding or bruising  vomiting Side effects that usually do not require medical attention (report to your doctor or health care professional if they continue or are bothersome):  headache  joint pain  muscle cramps or muscle pain  nausea  tiredness This list may not describe all possible side effects. Call your doctor for medical advice about side effects. You may report side effects to FDA at 1-800-FDA-1088. Where should I keep my medicine? This drug is given in a hospital or clinic and will not be stored at home. NOTE: This sheet is a summary. It may not cover all possible information. If you have questions about this medicine, talk to your doctor, pharmacist, or health care provider.  2020 Elsevier/Gold Standard (2018-08-21 22:01:36)  

## 2019-05-01 ENCOUNTER — Telehealth: Payer: Self-pay | Admitting: *Deleted

## 2019-05-06 ENCOUNTER — Inpatient Hospital Stay: Payer: Medicare Other | Admitting: Physician Assistant

## 2019-05-06 ENCOUNTER — Inpatient Hospital Stay: Payer: Medicare Other

## 2019-05-06 ENCOUNTER — Ambulatory Visit: Payer: Medicare Other

## 2019-05-06 ENCOUNTER — Other Ambulatory Visit: Payer: Self-pay

## 2019-05-06 ENCOUNTER — Encounter: Payer: Self-pay | Admitting: Physician Assistant

## 2019-05-06 VITALS — BP 121/79 | HR 88 | Temp 98.7°F | Resp 18 | Ht 59.0 in | Wt 140.5 lb

## 2019-05-06 VITALS — BP 104/64 | HR 93 | Temp 98.4°F | Resp 18

## 2019-05-06 DIAGNOSIS — C8104 Nodular lymphocyte predominant Hodgkin lymphoma, lymph nodes of axilla and upper limb: Secondary | ICD-10-CM

## 2019-05-06 DIAGNOSIS — Z79899 Other long term (current) drug therapy: Secondary | ICD-10-CM | POA: Insufficient documentation

## 2019-05-06 DIAGNOSIS — Z5111 Encounter for antineoplastic chemotherapy: Secondary | ICD-10-CM | POA: Diagnosis not present

## 2019-05-06 DIAGNOSIS — Z0000 Encounter for general adult medical examination without abnormal findings: Secondary | ICD-10-CM | POA: Insufficient documentation

## 2019-05-06 DIAGNOSIS — Z5112 Encounter for antineoplastic immunotherapy: Secondary | ICD-10-CM | POA: Diagnosis not present

## 2019-05-06 LAB — CBC WITH DIFFERENTIAL (CANCER CENTER ONLY)
Abs Immature Granulocytes: 0 10*3/uL (ref 0.00–0.07)
Basophils Absolute: 0 10*3/uL (ref 0.0–0.1)
Basophils Relative: 1 %
Eosinophils Absolute: 0.4 10*3/uL (ref 0.0–0.5)
Eosinophils Relative: 10 %
HCT: 37.6 % (ref 36.0–46.0)
Hemoglobin: 12.4 g/dL (ref 12.0–15.0)
Immature Granulocytes: 0 %
Lymphocytes Relative: 25 %
Lymphs Abs: 1.1 10*3/uL (ref 0.7–4.0)
MCH: 27.5 pg (ref 26.0–34.0)
MCHC: 33 g/dL (ref 30.0–36.0)
MCV: 83.4 fL (ref 80.0–100.0)
Monocytes Absolute: 0.6 10*3/uL (ref 0.1–1.0)
Monocytes Relative: 13 %
Neutro Abs: 2.1 10*3/uL (ref 1.7–7.7)
Neutrophils Relative %: 51 %
Platelet Count: 198 10*3/uL (ref 150–400)
RBC: 4.51 MIL/uL (ref 3.87–5.11)
RDW: 12.3 % (ref 11.5–15.5)
WBC Count: 4.1 10*3/uL (ref 4.0–10.5)
nRBC: 0 % (ref 0.0–0.2)

## 2019-05-06 LAB — CMP (CANCER CENTER ONLY)
ALT: 17 U/L (ref 0–44)
AST: 15 U/L (ref 15–41)
Albumin: 4.1 g/dL (ref 3.5–5.0)
Alkaline Phosphatase: 70 U/L (ref 38–126)
Anion gap: 11 (ref 5–15)
BUN: 16 mg/dL (ref 8–23)
CO2: 21 mmol/L — ABNORMAL LOW (ref 22–32)
Calcium: 9.2 mg/dL (ref 8.9–10.3)
Chloride: 109 mmol/L (ref 98–111)
Creatinine: 0.8 mg/dL (ref 0.44–1.00)
GFR, Est AFR Am: >60 mL/min (ref >60)
GFR, Estimated: >60 mL/min (ref >60)
Glucose, Bld: 182 mg/dL — ABNORMAL HIGH (ref 70–99)
Potassium: 3.5 mmol/L (ref 3.5–5.1)
Sodium: 141 mmol/L (ref 135–145)
Total Bilirubin: 0.5 mg/dL (ref 0.3–1.2)
Total Protein: 7 g/dL (ref 6.5–8.1)

## 2019-05-06 MED ORDER — SODIUM CHLORIDE 0.9 % IV SOLN
Freq: Once | INTRAVENOUS | Status: AC
  Administered 2019-05-06: 11:00:00 via INTRAVENOUS
  Filled 2019-05-06: qty 250

## 2019-05-06 MED ORDER — SODIUM CHLORIDE 0.9 % IV SOLN
375.0000 mg/m2 | Freq: Once | INTRAVENOUS | Status: AC
  Administered 2019-05-06: 600 mg via INTRAVENOUS
  Filled 2019-05-06: qty 10

## 2019-05-06 MED ORDER — DIPHENHYDRAMINE HCL 25 MG PO CAPS
ORAL_CAPSULE | ORAL | Status: AC
  Filled 2019-05-06: qty 2

## 2019-05-06 MED ORDER — ACETAMINOPHEN 325 MG PO TABS
650.0000 mg | ORAL_TABLET | Freq: Once | ORAL | Status: AC
  Administered 2019-05-06: 650 mg via ORAL

## 2019-05-06 MED ORDER — SODIUM CHLORIDE 0.9 % IV SOLN: 375.0000 mg/m2 | Freq: Once | INTRAVENOUS | Status: DC

## 2019-05-06 MED ORDER — DIPHENHYDRAMINE HCL 25 MG PO CAPS
50.0000 mg | ORAL_CAPSULE | Freq: Once | ORAL | Status: AC
  Administered 2019-05-06: 50 mg via ORAL

## 2019-05-06 MED ORDER — ACETAMINOPHEN 325 MG PO TABS
ORAL_TABLET | ORAL | Status: AC
  Filled 2019-05-06: qty 2

## 2019-05-06 NOTE — Progress Notes (Signed)
Changed to rapid rituximab per protocol. Kennith Center, Pharm.D., CPP 05/06/2019@10 :47 AM

## 2019-05-06 NOTE — Patient Instructions (Signed)
[facility] °Discharge Instructions for Patients Receiving Chemotherapy ° °Today you received the following chemotherapy agents: Rituximab ° °To help prevent nausea and vomiting after your treatment, we encourage you to take your nausea medication as directed.  °  °If you develop nausea and vomiting that is not controlled by your nausea medication, call the clinic.  ° °BELOW ARE SYMPTOMS THAT SHOULD BE REPORTED IMMEDIATELY: °· *FEVER GREATER THAN 100.5 F °· *CHILLS WITH OR WITHOUT FEVER °· NAUSEA AND VOMITING THAT IS NOT CONTROLLED WITH YOUR NAUSEA MEDICATION °· *UNUSUAL SHORTNESS OF BREATH °· *UNUSUAL BRUISING OR BLEEDING °· TENDERNESS IN MOUTH AND THROAT WITH OR WITHOUT PRESENCE OF ULCERS °· *URINARY PROBLEMS °· *BOWEL PROBLEMS °· UNUSUAL RASH °Items with * indicate a potential emergency and should be followed up as soon as possible. ° °Feel free to call the clinic should you have any questions or concerns. The clinic phone number is [phone]. ° °Please show the CHEMO ALERT CARD at check-in to the Emergency Department and triage nurse. ° ° °

## 2019-05-06 NOTE — Progress Notes (Signed)
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  PRIOR THERAPY: None  CURRENT THERAPY: Weekly Rituxan 375 mg/M2 for 4 weeks.  First dose on April 30, 2019. Status post 1 cycle.   INTERVAL HISTORY: Katherine Barnett 78 y.o. female returns to the clinic for a follow-up visit. The patient was recently started on weekly Rituxan for her recent diagnosis of Hodgkin's lymphoma, lymphocyte predominant subtype.  She is status post 1 cycle and she tolerated it well without any adverse side effects.  She denies any fevers, chills, night sweats, or weight loss.  The patient used to have night sweats but she has not had any recently.  She denies any headache or visual changes.  She denies any bleeding or bruising including epistaxis, gingival bleeding, hematuria, melena, or hematochezia.  She denies any rashes or skin changes.  She denies any nausea, vomiting, or diarrhea. She has mild constiaption for which she modifies her diet by eating more raisins and states that works well for her.  She denies any chest pain, shortness of breath, cough, or hemoptysis.  She is here today for evaluation before starting cycle #2.   MEDICAL HISTORY: Past Medical History:  Diagnosis Date  . Complication of anesthesia    vomited after colonoscopy  . Diabetes mellitus without complication ([place])    type 2 - diet controlled  . Hypertension     ALLERGIES:  has No Known Allergies.  MEDICATIONS:  Current Outpatient Medications  Medication Sig Dispense Refill  . allopurinol (ZYLOPRIM) 100 MG tablet Take 1 tablet (100 mg total) by mouth 2 (two) times daily. 60 tablet 2  . aspirin 81 MG tablet Take 81 mg by mouth daily.    .  calcium-vitamin D (OSCAL WITH D) 500-200 MG-UNIT per tablet Take 1 tablet by mouth daily with breakfast.    . losartan-hydrochlorothiazide (HYZAAR) 50-12.5 MG per tablet Take 1 tablet by mouth daily.    . metFORMIN (GLUCOPHAGE) 500 MG tablet Take 500 mg by mouth daily.    . prochlorperazine (COMPAZINE) 10 MG tablet Take 1 tablet (10 mg total) by mouth every 6 (six) hours as needed for nausea or vomiting. 30 tablet 0  . simvastatin (ZOCOR) 20 MG tablet Take 20 mg by mouth at bedtime.    . traMADol (ULTRAM) 50 MG tablet Take 1 tablet (50 mg total) by mouth every 6 (six) hours as needed for moderate pain or severe pain. 25 tablet 0  . valACYclovir (VALTREX) 1000 MG tablet Take 500 mg by mouth 2 (two) times daily as needed (fever blisters).      No current facility-administered medications for this visit.    Facility-Administered Medications Ordered in Other Visits  Medication Dose Route Frequency Provider Last Rate Last Dose  . riTUXimab-pvvr (RUXIENCE) 600 mg in sodium chloride 0.9 % 190 mL infusion  375 mg/m2 Intravenous Once Curt Bears, MD        SURGICAL HISTORY:  Past Surgical History:  Procedure Laterality Date  . AXILLARY LYMPH NODE BIOPSY Left 03/12/2019   Procedure: EXCISIONAL BIOPSY LEFT AXILLARY LYMPH NODES;  Surgeon: Coralie Keens, MD;  Location: [facility];  Service: General;  Laterality: Left;  . BREAST EXCISIONAL BIOPSY Left    Lt axillary bx, was benign  . COLONOSCOPY    .  EYE SURGERY Bilateral    cataract surgery with lens implant  . ORIF HUMERUS FRACTURE Right 12/22/2014   Procedure: OPEN REDUCTION INTERNAL FIXATION (ORIF) RIGHT DISTAL HUMERUS FRACTURE;  Surgeon: Mcarthur Rossetti, MD;  Location: [facility];  Service: Orthopedics;  Laterality: Right;    REVIEW OF SYSTEMS:   Review of Systems  Constitutional: Negative for appetite change, chills, fatigue, fever and unexpected weight change.  HENT: Negative for mouth sores, nosebleeds, sore throat and trouble  swallowing.   Eyes: Negative for eye problems and icterus.  Respiratory: Negative for cough, hemoptysis, shortness of breath and wheezing.   Cardiovascular: Negative for chest pain and leg swelling.  Gastrointestinal: Positive for mild constipation. Negative for abdominal pain, diarrhea, nausea and vomiting.  Genitourinary: Negative for bladder incontinence, difficulty urinating, dysuria, frequency and hematuria.   Musculoskeletal: Negative for back pain, gait problem, neck pain and neck stiffness.  Skin: Negative for itching and rash.  Neurological: Negative for dizziness, extremity weakness, gait problem, headaches, light-headedness and seizures.  Hematological: Negative for adenopathy. Does not bruise/bleed easily.  Psychiatric/Behavioral: Negative for confusion, depression and sleep disturbance. The patient is not nervous/anxious.     PHYSICAL EXAMINATION:  Blood pressure 121/79, pulse 88, temperature 98.7 F (37.1 C), resp. rate 18, height 4' 11" (1.499 m), weight 140 lb 8 oz (63.7 kg), SpO2 99 %.  ECOG PERFORMANCE STATUS: 0 - Asymptomatic  Physical Exam  Constitutional: Oriented to person, place, and time and well-developed, well-nourished, and in no distress. Marland Kitchen  HENT:  Head: Normocephalic and atraumatic.  Mouth/Throat: Oropharynx is clear and moist. No oropharyngeal exudate.  Eyes: Conjunctivae are normal. Right eye exhibits no discharge. Left eye exhibits no discharge. No scleral icterus.  Neck: Normal range of motion. Neck supple.  Cardiovascular: Normal rate, regular rhythm, normal heart sounds and intact distal pulses.   Pulmonary/Chest: Effort normal and breath sounds normal. No respiratory distress. No wheezes. No rales.  Abdominal: Soft. Bowel sounds are normal. Exhibits no distension and no mass. There is no tenderness.  Musculoskeletal: Normal range of motion. Exhibits no edema.  Lymphadenopathy:    No cervical adenopathy.  Neurological: Alert and oriented to person,  place, and time. Exhibits normal muscle tone. Gait normal. Coordination normal.  Skin: Scarring over left axilla from excisional biopsy. Skin is warm and dry. No rash noted. Not diaphoretic. No erythema. No pallor.  Psychiatric: Mood, memory and judgment normal.  Vitals reviewed.  LABORATORY DATA: Lab Results  Component Value Date   WBC 4.1 05/06/2019   HGB 12.4 05/06/2019   HCT 37.6 05/06/2019   MCV 83.4 05/06/2019   PLT 198 05/06/2019      Chemistry      Component Value Date/Time   NA 141 05/06/2019 0848   K 3.5 05/06/2019 0848   CL 109 05/06/2019 0848   CO2 21 (L) 05/06/2019 0848   BUN 16 05/06/2019 0848   CREATININE 0.80 05/06/2019 0848      Component Value Date/Time   CALCIUM 9.2 05/06/2019 0848   ALKPHOS 70 05/06/2019 0848   AST 15 05/06/2019 0848   ALT 17 05/06/2019 0848   BILITOT 0.5 05/06/2019 0848       RADIOGRAPHIC STUDIES:  Nm Pet Image Initial (pi) Skull Base To Thigh  Result Date: 04/17/2019 CLINICAL DATA:  Initial treatment strategy for lymphoma. EXAM: NUCLEAR MEDICINE PET SKULL BASE TO THIGH TECHNIQUE: 7.0 mCi F-18 FDG was injected intravenously. Full-ring PET imaging was performed from the skull base to thigh after the radiotracer. CT data  was obtained and used for attenuation correction and anatomic localization. Fasting blood glucose: 149 mg/dl COMPARISON:  None FINDINGS: Mediastinal blood pool activity: SUV max 2.96 Liver activity: SUV max 4.27 NECK: No hypermetabolic lymph nodes in the neck. Incidental CT findings: none CHEST: Enlarged left retropectoral node measures 3.5 x 1.9 cm within SUV max of 7.3. Left axillary lymph node measures 2.3 x 1.6 cm with SUV max of 8.6. Postsurgical change within the left axilla with surgical clips noted. No enlarged or FDG avid supraclavicular or right axillary lymph nodes. Pre-vascular lymph node anterior to the aortic hour measures 1.2 cm without significant FDG uptake. Adjacent left pre-vascular node measures 1.1 cm  within SUV max of 3.5. No hypermetabolic hilar lymph nodes. No pleural effusion identified. No suspicious hypermetabolic lung nodules. Incidental CT findings: Tiny nonspecific nodule in the anterior right upper lobe measures 3 mm, image 23/8. ABDOMEN/PELVIS: No abnormal hypermetabolic activity within the liver, pancreas, adrenal glands, or spleen. No hypermetabolic lymph nodes in the abdomen or pelvis. The spleen is normal in size measuring 6.9 cm and has an SUV max of 3.37. Incidental CT findings: Aortic atherosclerosis.  No aneurysm. SKELETON: No focal hypermetabolic activity to suggest skeletal metastasis. Incidental CT findings: none IMPRESSION: 1. Enlarged and FDG avid left retropectoral and left axillary lymph nodes compatible with the history of lymphoma. 2. Borderline enlarged left pre-vascular lymph nodes within the anterior mediastinum exhibit either no uptake or mild uptake less than liver. 3. No mass or adenopathy within the abdomen or pelvis. Normal size spleen. 4.  Aortic Atherosclerosis (ICD10-I70.0). Electronically Signed   By: Kerby Moors M.D.   On: 04/17/2019 11:56   Ct Biopsy  Result Date: 04/15/2019 INDICATION: 78 year old with Hodgkin lymphoma. EXAM: CT GUIDED BONE MARROW ASPIRATES AND BIOPSY Physician: Stephan Minister. Anselm Pancoast, MD MEDICATIONS: None. ANESTHESIA/SEDATION: Fentanyl 100 mcg IV; Versed 2.0 mg IV Moderate Sedation Time:  10 minutes The patient was continuously monitored during the procedure by the interventional radiology nurse under my direct supervision. COMPLICATIONS: None immediate. PROCEDURE: The procedure was explained to the patient. The risks and benefits of the procedure were discussed and the patient's questions were addressed. Informed consent was obtained from the patient. The patient was placed prone on CT table. Images of the pelvis were obtained. The right side of back was prepped and draped in sterile fashion. The skin and right posterior ilium were anesthetized with 1%  lidocaine. 11 gauge bone needle was directed into the right ilium with CT guidance. Two aspirates and one core biopsy were obtained. Bandage placed over the puncture site. IMPRESSION: CT guided bone marrow aspiration and core biopsy. Electronically Signed   By: Markus Daft M.D.   On: 04/15/2019 12:46   Ct Bone Marrow Biopsy & Aspiration  Result Date: 04/15/2019 INDICATION: 78 year old with Hodgkin lymphoma. EXAM: CT GUIDED BONE MARROW ASPIRATES AND BIOPSY Physician: Stephan Minister. Anselm Pancoast, MD MEDICATIONS: None. ANESTHESIA/SEDATION: Fentanyl 100 mcg IV; Versed 2.0 mg IV Moderate Sedation Time:  10 minutes The patient was continuously monitored during the procedure by the interventional radiology nurse under my direct supervision. COMPLICATIONS: None immediate. PROCEDURE: The procedure was explained to the patient. The risks and benefits of the procedure were discussed and the patient's questions were addressed. Informed consent was obtained from the patient. The patient was placed prone on CT table. Images of the pelvis were obtained. The right side of back was prepped and draped in sterile fashion. The skin and right posterior ilium were anesthetized with 1% lidocaine. 11 gauge  bone needle was directed into the right ilium with CT guidance. Two aspirates and one core biopsy were obtained. Bandage placed over the puncture site. IMPRESSION: CT guided bone marrow aspiration and core biopsy. Electronically Signed   By: Markus Daft M.D.   On: 04/15/2019 12:46     ASSESSMENT/PLAN:  This is a very pleasant 78 year old African-American female recently diagnosed with stage Ia non-bulky Hodgkin's lymphoma, lymphocyte predominant subtype.  She presented with enlargement and hypermetabolism of the left retropectoral and left axillary lymph nodes.  She was diagnosed in September 2020.  She is currently undergoing treatment with weekly Rituxan for total of 4 weekly doses.  She is status post 1 cycle and she tolerated well without  any adverse side effects.  She will receive radiation treatment following completion of Rituxan.  The patient was seen with Dr. Julien Nordmann today.  Labs were reviewed.  We recommend that she proceed with cycle #2 today as scheduled.  We will see the patient back for follow-up visit in 2 weeks for evaluation before starting her final weekly dose of Rituxan with cycle #4.  The patient was advised to call immediately if she has any concerning symptoms in the interval. The patient voices understanding of current disease status and treatment options and is in agreement with the current care plan. All questions were answered. The patient knows to call the clinic with any problems, questions or concerns. We can certainly see the patient much sooner if necessary  No orders of the defined types were placed in this encounter.    Cassandra L Heilingoetter, PA-C 05/06/19  ADDENDUM: Hematology/Oncology Attending: I had a face-to-face encounter with the patient today.  I recommended her care plan.  This is a very pleasant 78 years old African-American female recently diagnosed with a stage Ia Hodgkin lymphoma, lymphocyte predominant subtype.  She is currently undergoing treatment with weekly Rituxan status post 1 cycle.  She tolerated the first cycle of her treatment fairly well with no significant adverse effects. I recommended for the patient to continue her current treatment with Rituxan and she will proceed with cycle 2 today. We will see her back for follow-up visit in 2 weeks for evaluation before cycle #4. The patient was advised to call immediately if she has any concerning symptoms in the interval.  Disclaimer: This note was dictated with voice recognition software. Similar sounding words can inadvertently be transcribed and may be missed upon review. Eilleen Kempf, MD 05/06/19

## 2019-05-13 ENCOUNTER — Inpatient Hospital Stay: Payer: Medicare Other | Admitting: Internal Medicine

## 2019-05-13 ENCOUNTER — Other Ambulatory Visit: Payer: Self-pay

## 2019-05-13 ENCOUNTER — Inpatient Hospital Stay: Payer: Medicare Other

## 2019-05-13 ENCOUNTER — Encounter: Payer: Self-pay | Admitting: Internal Medicine

## 2019-05-13 VITALS — BP 117/79 | HR 103 | Temp 98.7°F | Resp 19 | Ht 59.0 in | Wt 139.9 lb

## 2019-05-13 VITALS — BP 105/69 | HR 88 | Temp 98.2°F | Resp 20

## 2019-05-13 DIAGNOSIS — C8104 Nodular lymphocyte predominant Hodgkin lymphoma, lymph nodes of axilla and upper limb: Secondary | ICD-10-CM

## 2019-05-13 DIAGNOSIS — Z5112 Encounter for antineoplastic immunotherapy: Secondary | ICD-10-CM | POA: Diagnosis not present

## 2019-05-13 DIAGNOSIS — Z5111 Encounter for antineoplastic chemotherapy: Secondary | ICD-10-CM

## 2019-05-13 LAB — CBC WITH DIFFERENTIAL (CANCER CENTER ONLY)
Abs Immature Granulocytes: 0.01 10*3/uL (ref 0.00–0.07)
Basophils Absolute: 0 10*3/uL (ref 0.0–0.1)
Basophils Relative: 1 %
Eosinophils Absolute: 0.5 10*3/uL (ref 0.0–0.5)
Eosinophils Relative: 12 %
HCT: 36.3 % (ref 36.0–46.0)
Hemoglobin: 12.1 g/dL (ref 12.0–15.0)
Immature Granulocytes: 0 %
Lymphocytes Relative: 25 %
Lymphs Abs: 1 10*3/uL (ref 0.7–4.0)
MCH: 27.9 pg (ref 26.0–34.0)
MCHC: 33.3 g/dL (ref 30.0–36.0)
MCV: 83.8 fL (ref 80.0–100.0)
Monocytes Absolute: 0.6 10*3/uL (ref 0.1–1.0)
Monocytes Relative: 16 %
Neutro Abs: 1.8 10*3/uL (ref 1.7–7.7)
Neutrophils Relative %: 46 %
Platelet Count: 205 10*3/uL (ref 150–400)
RBC: 4.33 MIL/uL (ref 3.87–5.11)
RDW: 12.8 % (ref 11.5–15.5)
WBC Count: 3.9 10*3/uL — ABNORMAL LOW (ref 4.0–10.5)
nRBC: 0 % (ref 0.0–0.2)

## 2019-05-13 LAB — CMP (CANCER CENTER ONLY)
ALT: 17 U/L (ref 0–44)
AST: 15 U/L (ref 15–41)
Albumin: 3.8 g/dL (ref 3.5–5.0)
Alkaline Phosphatase: 75 U/L (ref 38–126)
Anion gap: 11 (ref 5–15)
BUN: 14 mg/dL (ref 8–23)
CO2: 21 mmol/L — ABNORMAL LOW (ref 22–32)
Calcium: 8.8 mg/dL — ABNORMAL LOW (ref 8.9–10.3)
Chloride: 108 mmol/L (ref 98–111)
Creatinine: 0.83 mg/dL (ref 0.44–1.00)
GFR, Est AFR Am: >60 mL/min (ref >60)
GFR, Estimated: >60 mL/min (ref >60)
Glucose, Bld: 265 mg/dL — ABNORMAL HIGH (ref 70–99)
Potassium: 3.6 mmol/L (ref 3.5–5.1)
Sodium: 140 mmol/L (ref 135–145)
Total Bilirubin: 0.5 mg/dL (ref 0.3–1.2)
Total Protein: 6.6 g/dL (ref 6.5–8.1)

## 2019-05-13 MED ORDER — DIPHENHYDRAMINE HCL 25 MG PO CAPS
ORAL_CAPSULE | ORAL | Status: AC
  Filled 2019-05-13: qty 2

## 2019-05-13 MED ORDER — SODIUM CHLORIDE 0.9 % IV SOLN
375.0000 mg/m2 | Freq: Once | INTRAVENOUS | Status: AC
  Administered 2019-05-13: 11:00:00 600 mg via INTRAVENOUS
  Filled 2019-05-13: qty 50

## 2019-05-13 MED ORDER — SODIUM CHLORIDE 0.9 % IV SOLN
Freq: Once | INTRAVENOUS | Status: AC
  Administered 2019-05-13: 10:00:00 via INTRAVENOUS
  Filled 2019-05-13: qty 250

## 2019-05-13 MED ORDER — DIPHENHYDRAMINE HCL 25 MG PO CAPS
50.0000 mg | ORAL_CAPSULE | Freq: Once | ORAL | Status: AC
  Administered 2019-05-13: 10:00:00 50 mg via ORAL

## 2019-05-13 MED ORDER — ACETAMINOPHEN 325 MG PO TABS
650.0000 mg | ORAL_TABLET | Freq: Once | ORAL | Status: AC
  Administered 2019-05-13: 650 mg via ORAL

## 2019-05-13 MED ORDER — SODIUM CHLORIDE 0.9 % IV SOLN: 375.0000 mg/m2 | Freq: Once | INTRAVENOUS | Status: DC

## 2019-05-13 MED ORDER — ACETAMINOPHEN 325 MG PO TABS
ORAL_TABLET | ORAL | Status: AC
  Filled 2019-05-13: qty 2

## 2019-05-13 NOTE — Progress Notes (Signed)
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  PRIOR THERAPY: None.  CURRENT THERAPY: Weekly Rituxan 375 mg/M2 for 4 weeks.  First dose expected April 29, 2019.  Status post 2 cycles.  INTERVAL HISTORY: Katherine Barnett 78 y.o. female returns to the clinic today for follow-up visit.  The patient is tolerating her current treatment with Rituxan fairly well.  She denied having any chest pain, shortness of breath, cough or hemoptysis.  She denied having any nausea, vomiting, diarrhea or constipation.  She has no weight loss or night sweats.  She is here today for evaluation before starting cycle #3.  MEDICAL HISTORY: Past Medical History:  Diagnosis Date   Complication of anesthesia    vomited after colonoscopy   Diabetes mellitus without complication ([place])    type 2 - diet controlled   Hypertension     ALLERGIES:  has No Known Allergies.  MEDICATIONS:  Current Outpatient Medications  Medication Sig Dispense Refill   allopurinol (ZYLOPRIM) 100 MG tablet Take 1 tablet (100 mg total) by mouth 2 (two) times daily. 60 tablet 2   aspirin 81 MG tablet Take 81 mg by mouth daily.     calcium-vitamin D (OSCAL WITH D) 500-200 MG-UNIT per tablet Take 1 tablet by mouth daily with breakfast.     losartan-hydrochlorothiazide (HYZAAR) 50-12.5 MG per tablet Take 1 tablet by mouth daily.     metFORMIN (GLUCOPHAGE) 500 MG tablet Take 500 mg by mouth daily.     simvastatin (ZOCOR) 20 MG tablet Take 20 mg by mouth at bedtime.     prochlorperazine (COMPAZINE) 10 MG tablet Take 1 tablet (10 mg total) by mouth every 6 (six) hours as needed  for nausea or vomiting. (Patient not taking: Reported on 05/13/2019) 30 tablet 0   traMADol (ULTRAM) 50 MG tablet Take 1 tablet (50 mg total) by mouth every 6 (six) hours as needed for moderate pain or severe pain. (Patient not taking: Reported on 05/13/2019) 25 tablet 0   valACYclovir (VALTREX) 1000 MG tablet Take 500 mg by mouth 2 (two) times daily as needed (fever blisters).      No current facility-administered medications for this visit.     SURGICAL HISTORY:  Past Surgical History:  Procedure Laterality Date   AXILLARY LYMPH NODE BIOPSY Left 03/12/2019   Procedure: EXCISIONAL BIOPSY LEFT AXILLARY LYMPH NODES;  Surgeon: Coralie Keens, MD;  Location: [facility];  Service: General;  Laterality: Left;   BREAST EXCISIONAL BIOPSY Left    Lt axillary bx, was benign   COLONOSCOPY     EYE SURGERY Bilateral    cataract surgery with lens implant   ORIF HUMERUS FRACTURE Right 12/22/2014   Procedure: OPEN REDUCTION INTERNAL FIXATION (ORIF) RIGHT DISTAL HUMERUS FRACTURE;  Surgeon: Mcarthur Rossetti, MD;  Location: [facility];  Service: Orthopedics;  Laterality: Right;    REVIEW OF SYSTEMS:  A comprehensive review of systems was negative.   PHYSICAL EXAMINATION: General appearance: alert, cooperative and no distress Head: Normocephalic, without obvious abnormality, atraumatic Neck: no adenopathy, no JVD, supple, symmetrical, trachea midline and thyroid not enlarged, symmetric, no tenderness/mass/nodules Lymph nodes: Cervical, supraclavicular, and axillary nodes normal. Resp: clear to auscultation bilaterally Back: symmetric,  no curvature. ROM normal. No CVA tenderness. Cardio: regular rate and rhythm, S1, S2 normal, no murmur, click, rub or gallop GI: soft, non-tender; bowel sounds normal; no masses,  no organomegaly Extremities: extremities normal, atraumatic, no cyanosis or edema  ECOG PERFORMANCE STATUS: 1 - Symptomatic but completely ambulatory  Blood pressure 117/79, pulse (!) 103,  temperature 98.7 F (37.1 C), temperature source Temporal, resp. rate 19, height _0  (1.499 m), weight 139 lb 14.4 oz (63.5 kg), SpO2 100 %.  LABORATORY DATA: Lab Results  Component Value Date   WBC 4.1 05/06/2019   HGB 12.4 05/06/2019   HCT 37.6 05/06/2019   MCV 83.4 05/06/2019   PLT 198 05/06/2019      Chemistry      Component Value Date/Time   NA 141 05/06/2019 0848   K 3.5 05/06/2019 0848   CL 109 05/06/2019 0848   CO2 21 (L) 05/06/2019 0848   BUN 16 05/06/2019 0848   CREATININE 0.80 05/06/2019 0848      Component Value Date/Time   CALCIUM 9.2 05/06/2019 0848   ALKPHOS 70 05/06/2019 0848   AST 15 05/06/2019 0848   ALT 17 05/06/2019 0848   BILITOT 0.5 05/06/2019 0848       RADIOGRAPHIC STUDIES: Nm Pet Image Initial (pi) Skull Base To Thigh  Result Date: 04/17/2019 CLINICAL DATA:  Initial treatment strategy for lymphoma. EXAM: NUCLEAR MEDICINE PET SKULL BASE TO THIGH TECHNIQUE: 7.0 mCi F-18 FDG was injected intravenously. Full-ring PET imaging was performed from the skull base to thigh after the radiotracer. CT data was obtained and used for attenuation correction and anatomic localization. Fasting blood glucose: 149 mg/dl COMPARISON:  None FINDINGS: Mediastinal blood pool activity: SUV max 2.96 Liver activity: SUV max 4.27 NECK: No hypermetabolic lymph nodes in the neck. Incidental CT findings: none CHEST: Enlarged left retropectoral node measures 3.5 x 1.9 cm within SUV max of 7.3. Left axillary lymph node measures 2.3 x 1.6 cm with SUV max of 8.6. Postsurgical change within the left axilla with surgical clips noted. No enlarged or FDG avid supraclavicular or right axillary lymph nodes. Pre-vascular lymph node anterior to the aortic hour measures 1.2 cm without significant FDG uptake. Adjacent left pre-vascular node measures 1.1 cm within SUV max of 3.5. No hypermetabolic hilar lymph nodes. No pleural effusion identified. No suspicious hypermetabolic lung nodules.  Incidental CT findings: Tiny nonspecific nodule in the anterior right upper lobe measures 3 mm, image 23/8. ABDOMEN/PELVIS: No abnormal hypermetabolic activity within the liver, pancreas, adrenal glands, or spleen. No hypermetabolic lymph nodes in the abdomen or pelvis. The spleen is normal in size measuring 6.9 cm and has an SUV max of 3.37. Incidental CT findings: Aortic atherosclerosis.  No aneurysm. SKELETON: No focal hypermetabolic activity to suggest skeletal metastasis. Incidental CT findings: none IMPRESSION: 1. Enlarged and FDG avid left retropectoral and left axillary lymph nodes compatible with the history of lymphoma. 2. Borderline enlarged left pre-vascular lymph nodes within the anterior mediastinum exhibit either no uptake or mild uptake less than liver. 3. No mass or adenopathy within the abdomen or pelvis. Normal size spleen. 4.  Aortic Atherosclerosis (ICD10-I70.0). Electronically Signed   By: Kerby Moors M.D.   On: 04/17/2019 11:56   Ct Biopsy  Result Date: 04/15/2019 INDICATION: 78 year old with Hodgkin lymphoma. EXAM: CT GUIDED BONE MARROW ASPIRATES AND BIOPSY Physician: Stephan Minister. Anselm Pancoast, MD MEDICATIONS: None. ANESTHESIA/SEDATION: Fentanyl 100 mcg IV; Versed 2.0 mg IV Moderate Sedation Time:  10 minutes The patient was continuously monitored during  the procedure by the interventional radiology nurse under my direct supervision. COMPLICATIONS: None immediate. PROCEDURE: The procedure was explained to the patient. The risks and benefits of the procedure were discussed and the patient's questions were addressed. Informed consent was obtained from the patient. The patient was placed prone on CT table. Images of the pelvis were obtained. The right side of back was prepped and draped in sterile fashion. The skin and right posterior ilium were anesthetized with 1% lidocaine. 11 gauge bone needle was directed into the right ilium with CT guidance. Two aspirates and one core biopsy were obtained.  Bandage placed over the puncture site. IMPRESSION: CT guided bone marrow aspiration and core biopsy. Electronically Signed   By: Markus Daft M.D.   On: 04/15/2019 12:46   Ct Bone Marrow Biopsy & Aspiration  Result Date: 04/15/2019 INDICATION: 78 year old with Hodgkin lymphoma. EXAM: CT GUIDED BONE MARROW ASPIRATES AND BIOPSY Physician: Stephan Minister. Anselm Pancoast, MD MEDICATIONS: None. ANESTHESIA/SEDATION: Fentanyl 100 mcg IV; Versed 2.0 mg IV Moderate Sedation Time:  10 minutes The patient was continuously monitored during the procedure by the interventional radiology nurse under my direct supervision. COMPLICATIONS: None immediate. PROCEDURE: The procedure was explained to the patient. The risks and benefits of the procedure were discussed and the patient's questions were addressed. Informed consent was obtained from the patient. The patient was placed prone on CT table. Images of the pelvis were obtained. The right side of back was prepped and draped in sterile fashion. The skin and right posterior ilium were anesthetized with 1% lidocaine. 11 gauge bone needle was directed into the right ilium with CT guidance. Two aspirates and one core biopsy were obtained. Bandage placed over the puncture site. IMPRESSION: CT guided bone marrow aspiration and core biopsy. Electronically Signed   By: Markus Daft M.D.   On: 04/15/2019 12:46    ASSESSMENT AND PLAN: This is a very pleasant 78 years old African-American female recently diagnosed with a stage Ia non-bulky Hodgkin lymphoma, lymphocyte predominant subtype diagnosed in September 2020 and presented with enlarged and hypermetabolic left retropectoral and left axillary lymph nodes. The patient is mildly symptomatic with mild pain in the left axilla. She is currently undergoing treatment with weekly Rituxan status post 2 cycles.  The patient is tolerating her treatment well with no concerning adverse effects. I recommended for her to proceed with cycle #3 today as planned.  She  will finish her treatment next week. I will see her back for follow-up visit in 3 months with repeat CT scan of the chest for evaluation of her disease. She was advised to call immediately if she has any concerning symptoms in the interval. The patient voices understanding of current disease status and treatment options and is in agreement with the current care plan.  All questions were answered. The patient knows to call the clinic with any problems, questions or concerns. We can certainly see the patient much sooner if necessary.  I spent 10 minutes counseling the patient face to face. The total time spent in the appointment was 15 minutes.  Disclaimer: This note was dictated with voice recognition software. Similar sounding words can inadvertently be transcribed and may not be corrected upon review.

## 2019-05-13 NOTE — Patient Instructions (Signed)
[facility] °Discharge Instructions for Patients Receiving Chemotherapy ° °Today you received the following chemotherapy agents: Rituximab ° °To help prevent nausea and vomiting after your treatment, we encourage you to take your nausea medication as directed.  °  °If you develop nausea and vomiting that is not controlled by your nausea medication, call the clinic.  ° °BELOW ARE SYMPTOMS THAT SHOULD BE REPORTED IMMEDIATELY: °· *FEVER GREATER THAN 100.5 F °· *CHILLS WITH OR WITHOUT FEVER °· NAUSEA AND VOMITING THAT IS NOT CONTROLLED WITH YOUR NAUSEA MEDICATION °· *UNUSUAL SHORTNESS OF BREATH °· *UNUSUAL BRUISING OR BLEEDING °· TENDERNESS IN MOUTH AND THROAT WITH OR WITHOUT PRESENCE OF ULCERS °· *URINARY PROBLEMS °· *BOWEL PROBLEMS °· UNUSUAL RASH °Items with * indicate a potential emergency and should be followed up as soon as possible. ° °Feel free to call the clinic should you have any questions or concerns. The clinic phone number is [phone]. ° °Please show the CHEMO ALERT CARD at check-in to the Emergency Department and triage nurse. ° ° °

## 2019-05-14 ENCOUNTER — Telehealth: Payer: Self-pay | Admitting: Internal Medicine

## 2019-05-14 NOTE — Telephone Encounter (Signed)
Scheduled appt per 10/20 los.  Sent a message to get a calendar mailed out.

## 2019-05-20 ENCOUNTER — Other Ambulatory Visit: Payer: Self-pay

## 2019-05-20 ENCOUNTER — Inpatient Hospital Stay: Payer: Medicare Other

## 2019-05-20 ENCOUNTER — Other Ambulatory Visit: Payer: Self-pay | Admitting: Internal Medicine

## 2019-05-20 ENCOUNTER — Ambulatory Visit: Payer: Medicare Other | Admitting: Physician Assistant

## 2019-05-20 VITALS — BP 117/76 | HR 85 | Temp 98.6°F | Resp 20 | Wt 140.5 lb

## 2019-05-20 DIAGNOSIS — C8104 Nodular lymphocyte predominant Hodgkin lymphoma, lymph nodes of axilla and upper limb: Secondary | ICD-10-CM

## 2019-05-20 DIAGNOSIS — Z5112 Encounter for antineoplastic immunotherapy: Secondary | ICD-10-CM | POA: Diagnosis not present

## 2019-05-20 LAB — CBC WITH DIFFERENTIAL (CANCER CENTER ONLY)
Abs Immature Granulocytes: 0.01 10*3/uL (ref 0.00–0.07)
Basophils Absolute: 0 10*3/uL (ref 0.0–0.1)
Basophils Relative: 1 %
Eosinophils Absolute: 0.5 10*3/uL (ref 0.0–0.5)
Eosinophils Relative: 13 %
HCT: 37.2 % (ref 36.0–46.0)
Hemoglobin: 12.3 g/dL (ref 12.0–15.0)
Immature Granulocytes: 0 %
Lymphocytes Relative: 32 %
Lymphs Abs: 1.2 10*3/uL (ref 0.7–4.0)
MCH: 28 pg (ref 26.0–34.0)
MCHC: 33.1 g/dL (ref 30.0–36.0)
MCV: 84.5 fL (ref 80.0–100.0)
Monocytes Absolute: 0.6 10*3/uL (ref 0.1–1.0)
Monocytes Relative: 15 %
Neutro Abs: 1.5 10*3/uL — ABNORMAL LOW (ref 1.7–7.7)
Neutrophils Relative %: 39 %
Platelet Count: 196 10*3/uL (ref 150–400)
RBC: 4.4 MIL/uL (ref 3.87–5.11)
RDW: 12.8 % (ref 11.5–15.5)
WBC Count: 3.8 10*3/uL — ABNORMAL LOW (ref 4.0–10.5)
nRBC: 0 % (ref 0.0–0.2)

## 2019-05-20 LAB — CMP (CANCER CENTER ONLY)
ALT: 23 U/L (ref 0–44)
AST: 16 U/L (ref 15–41)
Albumin: 4.1 g/dL (ref 3.5–5.0)
Alkaline Phosphatase: 77 U/L (ref 38–126)
Anion gap: 11 (ref 5–15)
BUN: 15 mg/dL (ref 8–23)
CO2: 23 mmol/L (ref 22–32)
Calcium: 9.4 mg/dL (ref 8.9–10.3)
Chloride: 107 mmol/L (ref 98–111)
Creatinine: 0.92 mg/dL (ref 0.44–1.00)
GFR, Est AFR Am: >60 mL/min (ref >60)
GFR, Estimated: >60 mL/min (ref >60)
Glucose, Bld: 169 mg/dL — ABNORMAL HIGH (ref 70–99)
Potassium: 3.9 mmol/L (ref 3.5–5.1)
Sodium: 141 mmol/L (ref 135–145)
Total Bilirubin: 0.3 mg/dL (ref 0.3–1.2)
Total Protein: 7 g/dL (ref 6.5–8.1)

## 2019-05-20 LAB — URIC ACID: Uric Acid, Serum: 2.4 mg/dL — ABNORMAL LOW (ref 2.5–7.1)

## 2019-05-20 MED ORDER — DIPHENHYDRAMINE HCL 25 MG PO CAPS
50.0000 mg | ORAL_CAPSULE | Freq: Once | ORAL | Status: AC
  Administered 2019-05-20: 50 mg via ORAL

## 2019-05-20 MED ORDER — SODIUM CHLORIDE 0.9 % IV SOLN
Freq: Once | INTRAVENOUS | Status: AC
  Administered 2019-05-20: 13:00:00 via INTRAVENOUS
  Filled 2019-05-20: qty 250

## 2019-05-20 MED ORDER — DIPHENHYDRAMINE HCL 25 MG PO CAPS
ORAL_CAPSULE | ORAL | Status: AC
  Filled 2019-05-20: qty 2

## 2019-05-20 MED ORDER — SODIUM CHLORIDE 0.9 % IV SOLN
375.0000 mg/m2 | Freq: Once | INTRAVENOUS | Status: AC
  Administered 2019-05-20: 600 mg via INTRAVENOUS
  Filled 2019-05-20: qty 50

## 2019-05-20 MED ORDER — ACETAMINOPHEN 325 MG PO TABS
ORAL_TABLET | ORAL | Status: AC
  Filled 2019-05-20: qty 2

## 2019-05-20 MED ORDER — ACETAMINOPHEN 325 MG PO TABS
650.0000 mg | ORAL_TABLET | Freq: Once | ORAL | Status: AC
  Administered 2019-05-20: 650 mg via ORAL

## 2019-05-20 NOTE — Patient Instructions (Signed)
[facility] Discharge Instructions for Patients Receiving Chemotherapy  Today you received the following chemotherapy agent: Rituxan  To help prevent nausea and vomiting after your treatment, we encourage you to take your nausea medication as directed by your MD.   If you develop nausea and vomiting that is not controlled by your nausea medication, call the clinic.   BELOW ARE SYMPTOMS THAT SHOULD BE REPORTED IMMEDIATELY:  *FEVER GREATER THAN 100.5 F  *CHILLS WITH OR WITHOUT FEVER  NAUSEA AND VOMITING THAT IS NOT CONTROLLED WITH YOUR NAUSEA MEDICATION  *UNUSUAL SHORTNESS OF BREATH  *UNUSUAL BRUISING OR BLEEDING  TENDERNESS IN MOUTH AND THROAT WITH OR WITHOUT PRESENCE OF ULCERS  *URINARY PROBLEMS  *BOWEL PROBLEMS  UNUSUAL RASH Items with * indicate a potential emergency and should be followed up as soon as possible.  Feel free to call the clinic should you have any questions or concerns. The clinic phone number is [phone].  Please show the CHEMO ALERT CARD at check-in to the Emergency Department and triage nurse.  Coronavirus (COVID-19) Are you at risk?  Are you at risk for the Coronavirus (COVID-19)?  To be considered HIGH RISK for Coronavirus (COVID-19), you have to meet the following criteria:  . Traveled to [place], [place], [place], [place] or [place]; or in the [place] to [place], [place], [place], or [place]; and have fever, cough, and shortness of breath within the last 2 weeks of travel OR . Been in close contact with a person diagnosed with COVID-19 within the last 2 weeks and have fever, cough, and shortness of breath . IF YOU DO NOT MEET THESE CRITERIA, YOU ARE CONSIDERED LOW RISK FOR COVID-19.  What to do if you are HIGH RISK for COVID-19?  . If you are having a medical emergency, call 911. . Seek medical care right away. Before you go to a doctor's office, urgent care or emergency department, call ahead and tell them about  your recent travel, contact with someone diagnosed with COVID-19, and your symptoms. You should receive instructions from your physician's office regarding next steps of care.  . When you arrive at healthcare provider, tell the healthcare staff immediately you have returned from visiting [place], [place], [place], [place] or [place]; or traveled in the [place] to [place], [place], [place], or [place]; in the last two weeks or you have been in close contact with a person diagnosed with COVID-19 in the last 2 weeks.   . Tell the health care staff about your symptoms: fever, cough and shortness of breath. . After you have been seen by a medical provider, you will be either: o Tested for (COVID-19) and discharged home on quarantine except to seek medical care if symptoms worsen, and asked to  - Stay home and avoid contact with others until you get your results (4-5 days)  - Avoid travel on public transportation if possible (such as bus, train, or airplane) or o Sent to the Emergency Department by EMS for evaluation, COVID-19 testing, and possible admission depending on your condition and test results.  What to do if you are LOW RISK for COVID-19?  Reduce your risk of any infection by using the same precautions used for avoiding the common cold or flu:  . Wash your hands often with soap and warm water for at least 20 seconds.  If soap and water are not readily available, use an alcohol-based hand sanitizer with at least 60% alcohol.  . If   coughing or sneezing, cover your mouth and nose by coughing or sneezing into the elbow areas of your shirt or coat, into a tissue or into your sleeve (not your hands). . Avoid shaking hands with others and consider head nods or verbal greetings only. . Avoid touching your eyes, nose, or mouth with unwashed hands.  . Avoid close contact with people who are sick. . Avoid places or events with large numbers of people in one location, like concerts or sporting  events. . Carefully consider travel plans you have or are making. . If you are planning any travel outside or inside the [place], visit the CDC's Travelers' Health webpage for the latest health notices. . If you have some symptoms but not all symptoms, continue to monitor at home and seek medical attention if your symptoms worsen. . If you are having a medical emergency, call 911.   [place] / e-Visit: [email or web address]         [facility]: [facility]: [phone]                   [facility]: [phone]

## 2019-06-04 ENCOUNTER — Telehealth: Payer: Self-pay

## 2019-06-04 NOTE — Telephone Encounter (Signed)
She can stop taking it now.  Thank you.

## 2019-06-04 NOTE — Telephone Encounter (Signed)
Who long should she continue taking the allopurinol?  Last treatment 05/20/2019.  Dr. Julien Nordmann to advise.

## 2019-08-11 ENCOUNTER — Other Ambulatory Visit: Payer: Self-pay

## 2019-08-11 ENCOUNTER — Inpatient Hospital Stay: Payer: Medicare PPO | Attending: Internal Medicine

## 2019-08-11 ENCOUNTER — Ambulatory Visit (HOSPITAL_COMMUNITY): Payer: Medicare PPO

## 2019-08-11 DIAGNOSIS — Z8571 Personal history of Hodgkin lymphoma: Secondary | ICD-10-CM | POA: Diagnosis present

## 2019-08-11 DIAGNOSIS — C8104 Nodular lymphocyte predominant Hodgkin lymphoma, lymph nodes of axilla and upper limb: Secondary | ICD-10-CM

## 2019-08-11 LAB — CBC WITH DIFFERENTIAL (CANCER CENTER ONLY)
Abs Immature Granulocytes: 0.01 10*3/uL (ref 0.00–0.07)
Basophils Absolute: 0 10*3/uL (ref 0.0–0.1)
Basophils Relative: 1 %
Eosinophils Absolute: 0.4 10*3/uL (ref 0.0–0.5)
Eosinophils Relative: 10 %
HCT: 40 % (ref 36.0–46.0)
Hemoglobin: 13 g/dL (ref 12.0–15.0)
Immature Granulocytes: 0 %
Lymphocytes Relative: 25 %
Lymphs Abs: 1.1 10*3/uL (ref 0.7–4.0)
MCH: 28.3 pg (ref 26.0–34.0)
MCHC: 32.5 g/dL (ref 30.0–36.0)
MCV: 87.1 fL (ref 80.0–100.0)
Monocytes Absolute: 0.5 10*3/uL (ref 0.1–1.0)
Monocytes Relative: 12 %
Neutro Abs: 2.2 10*3/uL (ref 1.7–7.7)
Neutrophils Relative %: 52 %
Platelet Count: 218 10*3/uL (ref 150–400)
RBC: 4.59 MIL/uL (ref 3.87–5.11)
RDW: 13.6 % (ref 11.5–15.5)
WBC Count: 4.3 10*3/uL (ref 4.0–10.5)
nRBC: 0 % (ref 0.0–0.2)

## 2019-08-11 LAB — URIC ACID: Uric Acid, Serum: 3 mg/dL (ref 2.5–7.1)

## 2019-08-11 LAB — CMP (CANCER CENTER ONLY)
ALT: 18 U/L (ref 0–44)
AST: 14 U/L — ABNORMAL LOW (ref 15–41)
Albumin: 4.2 g/dL (ref 3.5–5.0)
Alkaline Phosphatase: 82 U/L (ref 38–126)
Anion gap: 8 (ref 5–15)
BUN: 15 mg/dL (ref 8–23)
CO2: 25 mmol/L (ref 22–32)
Calcium: 9.3 mg/dL (ref 8.9–10.3)
Chloride: 108 mmol/L (ref 98–111)
Creatinine: 0.87 mg/dL (ref 0.44–1.00)
GFR, Est AFR Am: >60 mL/min (ref >60)
GFR, Estimated: >60 mL/min (ref >60)
Glucose, Bld: 231 mg/dL — ABNORMAL HIGH (ref 70–99)
Potassium: 4.1 mmol/L (ref 3.5–5.1)
Sodium: 141 mmol/L (ref 135–145)
Total Bilirubin: 0.4 mg/dL (ref 0.3–1.2)
Total Protein: 7.2 g/dL (ref 6.5–8.1)

## 2019-08-11 LAB — LACTATE DEHYDROGENASE: LDH: 152 U/L (ref 98–192)

## 2019-08-13 ENCOUNTER — Ambulatory Visit (HOSPITAL_COMMUNITY)
Admission: RE | Admit: 2019-08-13 | Discharge: 2019-08-13 | Disposition: A | Discharge status: Home / Self Care | Payer: Medicare PPO | Source: Ambulatory Visit | Attending: Internal Medicine | Admitting: Internal Medicine

## 2019-08-13 ENCOUNTER — Encounter (HOSPITAL_COMMUNITY): Payer: Self-pay

## 2019-08-13 ENCOUNTER — Encounter: Payer: Self-pay | Admitting: Internal Medicine

## 2019-08-13 ENCOUNTER — Other Ambulatory Visit: Payer: Self-pay

## 2019-08-13 ENCOUNTER — Inpatient Hospital Stay (HOSPITAL_BASED_OUTPATIENT_CLINIC_OR_DEPARTMENT_OTHER): Payer: Medicare PPO | Admitting: Internal Medicine

## 2019-08-13 VITALS — BP 127/85 | HR 102 | Temp 98.3°F | Resp 18 | Ht 59.0 in | Wt 141.6 lb

## 2019-08-13 DIAGNOSIS — C8104 Nodular lymphocyte predominant Hodgkin lymphoma, lymph nodes of axilla and upper limb: Secondary | ICD-10-CM | POA: Diagnosis present

## 2019-08-13 DIAGNOSIS — Z8571 Personal history of Hodgkin lymphoma: Secondary | ICD-10-CM | POA: Diagnosis not present

## 2019-08-13 MED ORDER — IOHEXOL 300 MG/ML  SOLN
75.0000 mL | Freq: Once | INTRAMUSCULAR | Status: AC | PRN
  Administered 2019-08-13: 08:00:00 75 mL via INTRAVENOUS

## 2019-08-13 MED ORDER — SODIUM CHLORIDE (PF) 0.9 % IJ SOLN
INTRAMUSCULAR | Status: AC
  Filled 2019-08-13: qty 50

## 2019-08-13 NOTE — Progress Notes (Signed)
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  PRIOR THERAPY: Weekly Rituxan 375 mg/M2 for 4 weeks.  First dose expected April 29, 2019.  Status post 4 cycles.  Last dose was given May 20, 2019.  CURRENT THERAPY: Observation.  INTERVAL HISTORY: Katherine Barnett 79 y.o. female returns to the clinic today for follow-up visit.  The patient is feeling fine today with no concerning complaints.  She denied having any recent weight loss or night sweats.  She has no nausea, vomiting, diarrhea or constipation.  She has no palpable lymphadenopathy.  She denied having any chest pain, shortness of breath, cough or hemoptysis.  She has no headache or visual changes.  She has been in observation for the last few months and she is here today for evaluation with repeat CT scan of the chest for restaging of her disease.  MEDICAL HISTORY: Past Medical History:  Diagnosis Date  . Complication of anesthesia    vomited after colonoscopy  . Diabetes mellitus without complication ([place])    type 2 - diet controlled  . Hypertension     ALLERGIES:  has No Known Allergies.  MEDICATIONS:  Current Outpatient Medications  Medication Sig Dispense Refill  . allopurinol (ZYLOPRIM) 100 MG tablet Take 1 tablet (100 mg total) by mouth 2 (two) times daily. 60 tablet 2  . aspirin 81 MG tablet Take 81 mg by mouth daily.    . calcium-vitamin D (OSCAL WITH D) 500-200 MG-UNIT per tablet Take 1 tablet by mouth daily with breakfast.    . losartan-hydrochlorothiazide (HYZAAR) 50-12.5 MG per tablet Take 1 tablet by mouth daily.    . metFORMIN (GLUCOPHAGE) 500 MG tablet  Take 500 mg by mouth daily.    . prochlorperazine (COMPAZINE) 10 MG tablet Take 1 tablet (10 mg total) by mouth every 6 (six) hours as needed for nausea or vomiting. (Patient not taking: Reported on 05/13/2019) 30 tablet 0  . simvastatin (ZOCOR) 20 MG tablet Take 20 mg by mouth at bedtime.    . traMADol (ULTRAM) 50 MG tablet Take 1 tablet (50 mg total) by mouth every 6 (six) hours as needed for moderate pain or severe pain. (Patient not taking: Reported on 05/13/2019) 25 tablet 0  . valACYclovir (VALTREX) 1000 MG tablet Take 500 mg by mouth 2 (two) times daily as needed (fever blisters).      No current facility-administered medications for this visit.   Facility-Administered Medications Ordered in Other Visits  Medication Dose Route Frequency Provider Last Rate Last Admin  . sodium chloride (PF) 0.9 % injection             SURGICAL HISTORY:  Past Surgical History:  Procedure Laterality Date  . AXILLARY LYMPH NODE BIOPSY Left 03/12/2019   Procedure: EXCISIONAL BIOPSY LEFT AXILLARY LYMPH NODES;  Surgeon: Coralie Keens, MD;  Location: [facility];  Service: General;  Laterality: Left;  . BREAST EXCISIONAL BIOPSY Left    Lt axillary bx, was benign  . COLONOSCOPY    . EYE SURGERY Bilateral    cataract surgery with lens implant  . ORIF HUMERUS FRACTURE Right 12/22/2014   Procedure: OPEN REDUCTION INTERNAL FIXATION (ORIF) RIGHT DISTAL HUMERUS FRACTURE;  Surgeon: Harrell Gave  Kerry Fort, MD;  Location: [facility];  Service: Orthopedics;  Laterality: Right;    REVIEW OF SYSTEMS:  A comprehensive review of systems was negative.   PHYSICAL EXAMINATION: General appearance: alert, cooperative and no distress Head: Normocephalic, without obvious abnormality, atraumatic Neck: no adenopathy, no JVD, supple, symmetrical, trachea midline and thyroid not enlarged, symmetric, no tenderness/mass/nodules Lymph nodes: Cervical, supraclavicular, and axillary nodes normal. Resp: clear to auscultation  bilaterally Back: symmetric, no curvature. ROM normal. No CVA tenderness. Cardio: regular rate and rhythm, S1, S2 normal, no murmur, click, rub or gallop GI: soft, non-tender; bowel sounds normal; no masses,  no organomegaly Extremities: extremities normal, atraumatic, no cyanosis or edema  ECOG PERFORMANCE STATUS: 0 - Asymptomatic  Blood pressure 127/85, pulse (!) 102, temperature 98.3 F (36.8 C), temperature source Oral, resp. rate 18, height 4\' 11"  (1.499 m), weight 141 lb 9.6 oz (64.2 kg), SpO2 100 %.  LABORATORY DATA: Lab Results  Component Value Date   WBC 4.3 08/11/2019   HGB 13.0 08/11/2019   HCT 40.0 08/11/2019   MCV 87.1 08/11/2019   PLT 218 08/11/2019      Chemistry      Component Value Date/Time   NA 141 08/11/2019 1031   K 4.1 08/11/2019 1031   CL 108 08/11/2019 1031   CO2 25 08/11/2019 1031   BUN 15 08/11/2019 1031   CREATININE 0.87 08/11/2019 1031      Component Value Date/Time   CALCIUM 9.3 08/11/2019 1031   ALKPHOS 82 08/11/2019 1031   AST 14 (L) 08/11/2019 1031   ALT 18 08/11/2019 1031   BILITOT 0.4 08/11/2019 1031       RADIOGRAPHIC STUDIES: CT Chest W Contrast  Result Date: 08/13/2019 CLINICAL DATA:  Lymphoma EXAM: CT CHEST WITH CONTRAST TECHNIQUE: Multidetector CT imaging of the chest was performed during intravenous contrast administration. CONTRAST:  24mL OMNIPAQUE IOHEXOL 300 MG/ML  SOLN COMPARISON:  PET-CT, 04/17/2019 FINDINGS: Cardiovascular: No significant vascular findings. Normal heart size. No pericardial effusion. Mediastinum/Nodes: Significant interval decrease in size of left axillary and subpectoral lymph nodes, the largest now measuring 2.0 x 1.1 cm, previously 3.5 x 1.9 cm (series 2, image 27). Surgical clips in the left axilla. Unchanged prevascular lymph nodes or soft tissue nodules, largest measuring 1.2 x 1.1 cm (series 2, image 43). Thyroid gland, trachea, and esophagus demonstrate no significant findings. Lungs/Pleura: Unchanged  small pulmonary nodules, including a 3 mm nodule of the right upper lobe (series 7, image 46) and a 3 mm nodule of the subpleural left lower lobe (series 7, image 47). No pleural effusion or pneumothorax. Upper Abdomen: No acute abnormality. Musculoskeletal: No chest wall mass or suspicious bone lesions identified. IMPRESSION: 1. Significant interval decrease in size of previously FDG PET avid left axillary and subpectoral lymph nodes, the largest now measuring 2.0 x 1.1 cm, previously 3.5 x 1.9 cm. Findings are consistent with treatment response. 2. Unchanged prevascular lymph nodes or soft tissue nodules, largest measuring 1.2 x 1.1 cm, not previously FDG PET avid. 3. Unchanged nonspecific small pulmonary nodules, including a 3 mm nodule of the right upper lobe (series 7, image 46) and a 3 mm nodule of the subpleural left lower lobe (series 7, image 47). Attention on follow-up. Electronically Signed   By: Eddie Candle M.D.   On: 08/13/2019 08:45    ASSESSMENT AND PLAN: This is a very pleasant 79 years old African-American female recently diagnosed with a stage Ia non-bulky Hodgkin lymphoma, lymphocyte predominant subtype diagnosed in September  2020 and presented with enlarged and hypermetabolic left retropectoral and left axillary lymph nodes. The patient is mildly symptomatic with mild pain in the left axilla. She underwent treatment with weekly Rituxan status post 4 cycles.  She tolerated her treatment well with no concerning adverse effects. She had repeat CT scan of the chest performed recently that showed improvement of the axillary lymphadenopathy and no other evidence for disease progression. I recommended for the patient to continue on observation with repeat CT scan of the chest as well as lab work in 6 months. She was advised to call immediately if she has any concerning symptoms in the interval. The patient voices understanding of current disease status and treatment options and is in agreement  with the current care plan.  All questions were answered. The patient knows to call the clinic with any problems, questions or concerns. We can certainly see the patient much sooner if necessary.  Disclaimer: This note was dictated with voice recognition software. Similar sounding words can inadvertently be transcribed and may not be corrected upon review.

## 2019-08-15 ENCOUNTER — Telehealth: Payer: Self-pay | Admitting: Internal Medicine

## 2019-08-15 NOTE — Telephone Encounter (Signed)
Scheduled per los. Called and left msg. Mailed printout  °

## 2019-09-16 ENCOUNTER — Telehealth: Payer: Self-pay

## 2019-09-16 NOTE — Telephone Encounter (Signed)
LVM for patient concerning intermittent pain in her chest area to follow up with her PCP.

## 2019-09-16 NOTE — Telephone Encounter (Signed)
Patient called back requesting a mammogram to know what is causing the pain in her left breast area. I again advised patient that she needs to be evaluated by her PCP and to contact her PCP. Patient voiced understanding.

## 2019-10-16 ENCOUNTER — Other Ambulatory Visit: Payer: Self-pay | Admitting: Internal Medicine

## 2019-10-16 DIAGNOSIS — Z1231 Encounter for screening mammogram for malignant neoplasm of breast: Secondary | ICD-10-CM

## 2019-12-29 ENCOUNTER — Other Ambulatory Visit: Payer: Self-pay

## 2019-12-29 ENCOUNTER — Ambulatory Visit
Admission: RE | Admit: 2019-12-29 | Discharge: 2019-12-29 | Disposition: A | Discharge status: Home / Self Care | Payer: Medicare PPO | Source: Ambulatory Visit | Attending: Internal Medicine | Admitting: Internal Medicine

## 2019-12-29 DIAGNOSIS — Z1231 Encounter for screening mammogram for malignant neoplasm of breast: Secondary | ICD-10-CM

## 2020-02-09 ENCOUNTER — Ambulatory Visit (HOSPITAL_COMMUNITY)
Admission: RE | Admit: 2020-02-09 | Discharge: 2020-02-09 | Disposition: A | Discharge status: Home / Self Care | Payer: Medicare PPO | Source: Ambulatory Visit | Attending: Internal Medicine | Admitting: Internal Medicine

## 2020-02-09 ENCOUNTER — Inpatient Hospital Stay: Payer: Medicare PPO | Attending: Internal Medicine

## 2020-02-09 ENCOUNTER — Other Ambulatory Visit: Payer: Medicare PPO

## 2020-02-09 ENCOUNTER — Other Ambulatory Visit: Payer: Self-pay

## 2020-02-09 DIAGNOSIS — E119 Type 2 diabetes mellitus without complications: Secondary | ICD-10-CM | POA: Insufficient documentation

## 2020-02-09 DIAGNOSIS — C8108 Nodular lymphocyte predominant Hodgkin lymphoma, lymph nodes of multiple sites: Secondary | ICD-10-CM | POA: Insufficient documentation

## 2020-02-09 DIAGNOSIS — I10 Essential (primary) hypertension: Secondary | ICD-10-CM | POA: Insufficient documentation

## 2020-02-09 DIAGNOSIS — C8104 Nodular lymphocyte predominant Hodgkin lymphoma, lymph nodes of axilla and upper limb: Secondary | ICD-10-CM

## 2020-02-09 DIAGNOSIS — Z79899 Other long term (current) drug therapy: Secondary | ICD-10-CM | POA: Diagnosis not present

## 2020-02-09 DIAGNOSIS — Z7982 Long term (current) use of aspirin: Secondary | ICD-10-CM | POA: Insufficient documentation

## 2020-02-09 DIAGNOSIS — Z7984 Long term (current) use of oral hypoglycemic drugs: Secondary | ICD-10-CM | POA: Diagnosis not present

## 2020-02-09 DIAGNOSIS — Z9221 Personal history of antineoplastic chemotherapy: Secondary | ICD-10-CM | POA: Diagnosis not present

## 2020-02-09 LAB — CMP (CANCER CENTER ONLY)
ALT: 17 U/L (ref 0–44)
AST: 16 U/L (ref 15–41)
Albumin: 4.3 g/dL (ref 3.5–5.0)
Alkaline Phosphatase: 93 U/L (ref 38–126)
Anion gap: 9 (ref 5–15)
BUN: 16 mg/dL (ref 8–23)
CO2: 25 mmol/L (ref 22–32)
Calcium: 9.7 mg/dL (ref 8.9–10.3)
Chloride: 109 mmol/L (ref 98–111)
Creatinine: 0.82 mg/dL (ref 0.44–1.00)
GFR, Est AFR Am: >60 mL/min (ref >60)
GFR, Estimated: >60 mL/min (ref >60)
Glucose, Bld: 101 mg/dL — ABNORMAL HIGH (ref 70–99)
Potassium: 3.9 mmol/L (ref 3.5–5.1)
Sodium: 143 mmol/L (ref 135–145)
Total Bilirubin: 0.6 mg/dL (ref 0.3–1.2)
Total Protein: 7.3 g/dL (ref 6.5–8.1)

## 2020-02-09 LAB — CBC WITH DIFFERENTIAL (CANCER CENTER ONLY)
Abs Immature Granulocytes: 0 10*3/uL (ref 0.00–0.07)
Basophils Absolute: 0 10*3/uL (ref 0.0–0.1)
Basophils Relative: 1 %
Eosinophils Absolute: 0.4 10*3/uL (ref 0.0–0.5)
Eosinophils Relative: 10 %
HCT: 38.9 % (ref 36.0–46.0)
Hemoglobin: 12.7 g/dL (ref 12.0–15.0)
Immature Granulocytes: 0 %
Lymphocytes Relative: 27 %
Lymphs Abs: 1 10*3/uL (ref 0.7–4.0)
MCH: 27.7 pg (ref 26.0–34.0)
MCHC: 32.6 g/dL (ref 30.0–36.0)
MCV: 84.7 fL (ref 80.0–100.0)
Monocytes Absolute: 0.5 10*3/uL (ref 0.1–1.0)
Monocytes Relative: 15 %
Neutro Abs: 1.7 10*3/uL (ref 1.7–7.7)
Neutrophils Relative %: 47 %
Platelet Count: 187 10*3/uL (ref 150–400)
RBC: 4.59 MIL/uL (ref 3.87–5.11)
RDW: 12.8 % (ref 11.5–15.5)
WBC Count: 3.5 10*3/uL — ABNORMAL LOW (ref 4.0–10.5)
nRBC: 0 % (ref 0.0–0.2)

## 2020-02-09 MED ORDER — IOHEXOL 300 MG/ML  SOLN
75.0000 mL | Freq: Once | INTRAMUSCULAR | Status: AC | PRN
  Administered 2020-02-09: 75 mL via INTRAVENOUS

## 2020-02-09 MED ORDER — SODIUM CHLORIDE (PF) 0.9 % IJ SOLN
INTRAMUSCULAR | Status: AC
  Filled 2020-02-09: qty 50

## 2020-02-10 ENCOUNTER — Encounter: Payer: Self-pay | Admitting: Internal Medicine

## 2020-02-10 ENCOUNTER — Inpatient Hospital Stay: Payer: Medicare PPO | Admitting: Internal Medicine

## 2020-02-10 ENCOUNTER — Other Ambulatory Visit: Payer: Self-pay

## 2020-02-10 VITALS — BP 118/76 | HR 85 | Temp 97.8°F | Resp 18 | Ht 59.0 in | Wt 138.7 lb

## 2020-02-10 DIAGNOSIS — C8108 Nodular lymphocyte predominant Hodgkin lymphoma, lymph nodes of multiple sites: Secondary | ICD-10-CM | POA: Diagnosis not present

## 2020-02-10 DIAGNOSIS — C8104 Nodular lymphocyte predominant Hodgkin lymphoma, lymph nodes of axilla and upper limb: Secondary | ICD-10-CM

## 2020-02-10 NOTE — Progress Notes (Signed)
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  PRIOR THERAPY: Weekly Rituxan 375 mg/M2 for 4 weeks.  First dose expected April 29, 2019.  Status post 4 cycles.  Last dose was given May 20, 2019.  CURRENT THERAPY: Observation.  INTERVAL HISTORY: Katherine Barnett 79 y.o. female returns to the clinic today for follow-up visit.  The patient is feeling fine today with no concerning complaints.  She denied having any recent weight loss or night sweats.  She has no nausea, vomiting, diarrhea or constipation.  She denied having any chest pain, shortness of breath, cough or hemoptysis.  She is here today for evaluation with repeat blood work as well as CT scan of the chest for restaging of her disease.  MEDICAL HISTORY: Past Medical History:  Diagnosis Date  . Complication of anesthesia    vomited after colonoscopy  . Diabetes mellitus without complication ([place])    type 2 - diet controlled  . Hypertension     ALLERGIES:  has No Known Allergies.  MEDICATIONS:  Current Outpatient Medications  Medication Sig Dispense Refill  . allopurinol (ZYLOPRIM) 100 MG tablet Take 1 tablet (100 mg total) by mouth 2 (two) times daily. 60 tablet 2  . aspirin 81 MG tablet Take 81 mg by mouth daily.    . calcium-vitamin D (OSCAL WITH D) 500-200 MG-UNIT per tablet Take 1 tablet by mouth daily with breakfast.    . losartan-hydrochlorothiazide (HYZAAR) 50-12.5 MG per tablet Take 1 tablet by mouth daily.    . metFORMIN (GLUCOPHAGE) 500 MG tablet Take 500 mg by mouth daily.    . prochlorperazine (COMPAZINE) 10 MG tablet Take 1 tablet (10 mg total) by mouth  every 6 (six) hours as needed for nausea or vomiting. (Patient not taking: Reported on 05/13/2019) 30 tablet 0  . simvastatin (ZOCOR) 20 MG tablet Take 20 mg by mouth at bedtime.    . traMADol (ULTRAM) 50 MG tablet Take 1 tablet (50 mg total) by mouth every 6 (six) hours as needed for moderate pain or severe pain. (Patient not taking: Reported on 05/13/2019) 25 tablet 0  . valACYclovir (VALTREX) 1000 MG tablet Take 500 mg by mouth 2 (two) times daily as needed (fever blisters).      No current facility-administered medications for this visit.    SURGICAL HISTORY:  Past Surgical History:  Procedure Laterality Date  . AXILLARY LYMPH NODE BIOPSY Left 03/12/2019   Procedure: EXCISIONAL BIOPSY LEFT AXILLARY LYMPH NODES;  Surgeon: Coralie Keens, MD;  Location: [facility];  Service: General;  Laterality: Left;  . BREAST EXCISIONAL BIOPSY Left    Lt axillary bx, was benign  . COLONOSCOPY    . EYE SURGERY Bilateral    cataract surgery with lens implant  . ORIF HUMERUS FRACTURE Right 12/22/2014   Procedure: OPEN REDUCTION INTERNAL FIXATION (ORIF) RIGHT DISTAL HUMERUS FRACTURE;  Surgeon: Mcarthur Rossetti, MD;  Location: [facility];  Service: Orthopedics;  Laterality: Right;    REVIEW OF SYSTEMS:  A comprehensive review of systems was negative.   PHYSICAL EXAMINATION: General appearance: alert, cooperative and no distress Head: Normocephalic, without obvious abnormality, atraumatic Neck: no adenopathy, no JVD, supple, symmetrical, trachea midline and thyroid  not enlarged, symmetric, no tenderness/mass/nodules Lymph nodes: Cervical, supraclavicular, and axillary nodes normal. Resp: clear to auscultation bilaterally Back: symmetric, no curvature. ROM normal. No CVA tenderness. Cardio: regular rate and rhythm, S1, S2 normal, no murmur, click, rub or gallop GI: soft, non-tender; bowel sounds normal; no masses,  no organomegaly Extremities: extremities normal, atraumatic, no cyanosis or edema  ECOG  PERFORMANCE STATUS: 0 - Asymptomatic  Blood pressure 118/76, pulse 85, temperature 97.8 F (36.6 C), temperature source Temporal, resp. rate 18, height 4\' 11"  (1.499 m), weight 138 lb 10.4 oz (62.9 kg), SpO2 100 %.  LABORATORY DATA: Lab Results  Component Value Date   WBC 3.5 (L) 02/09/2020   HGB 12.7 02/09/2020   HCT 38.9 02/09/2020   MCV 84.7 02/09/2020   PLT 187 02/09/2020      Chemistry      Component Value Date/Time   NA 143 02/09/2020 1157   K 3.9 02/09/2020 1157   CL 109 02/09/2020 1157   CO2 25 02/09/2020 1157   BUN 16 02/09/2020 1157   CREATININE 0.82 02/09/2020 1157      Component Value Date/Time   CALCIUM 9.7 02/09/2020 1157   ALKPHOS 93 02/09/2020 1157   AST 16 02/09/2020 1157   ALT 17 02/09/2020 1157   BILITOT 0.6 02/09/2020 1157       RADIOGRAPHIC STUDIES: CT Chest W Contrast  Result Date: 02/10/2020 CLINICAL DATA:  Primary Cancer Type: Lymphoma Imaging Indication: Routine surveillance Interval therapy since last imaging? No Initial Cancer Diagnosis Date: 01/03/2019; Established by: Biopsy-proven Detailed Pathology: Nodular lymphocyte predominant Hodgkin lymphoma, stage IA. Primary Tumor location: Left retropectoral and left axillary lymph nodes. Surgeries: No thoracic. Chemotherapy: No Immunotherapy?  Yes; Type: Rituxan; Ongoing? No Radiation therapy? No EXAM: CT CHEST WITH CONTRAST TECHNIQUE: Multidetector CT imaging of the chest was performed during intravenous contrast administration. CONTRAST:  39mL OMNIPAQUE IOHEXOL 300 MG/ML  SOLN COMPARISON:  Most recent CT chest 08/13/2019.  04/17/2019 PET-CT. FINDINGS: Cardiovascular: No significant vascular findings. Normal heart size. No pericardial effusion. Mediastinum/Nodes: Slight interval decrease in size of enlarged left subpectoral lymph nodes, the largest measuring 1.6 x 1.1 cm, previously 2.0 x 1.1 cm (series 2, image 20). Unchanged anterior mediastinal lymph nodes or soft tissue nodules measuring up to 1.2 x  1.2 cm (series 2, image 36). Thyroid gland, trachea, and esophagus demonstrate no significant findings. Lungs/Pleura: Unchanged 3 mm pulmonary nodule of the subpleural superior segment left lower lobe (series 5, image 42). Unchanged 3 mm irregular nodule of the anterior right upper lobe (series 5, image 38). No pleural effusion or pneumothorax. Upper Abdomen: No acute abnormality.  Hepatic steatosis. Musculoskeletal: No chest wall mass or suspicious bone lesions identified. Surgical clips in the left axilla. IMPRESSION: 1. Slight interval decrease in size of enlarged left subpectoral lymph nodes. 2. Unchanged anterior mediastinal lymph nodes or soft tissue nodules. 3. Findings are consistent with slight interval treatment response. 4. Unchanged nonspecific 3 mm pulmonary nodules. Attention on follow-up. 5. Hepatic steatosis. Electronically Signed   By: Eddie Candle M.D.   On: 02/10/2020 09:47    ASSESSMENT AND PLAN: This is a very pleasant 79 years old African-American female recently diagnosed with a stage Ia non-bulky Hodgkin lymphoma, lymphocyte predominant subtype diagnosed in September 2020 and presented with enlarged and hypermetabolic left retropectoral and left axillary lymph nodes. The patient is mildly symptomatic with mild pain in the left axilla. She underwent treatment with weekly Rituxan status post 4 cycles.  She tolerated her treatment well with  no concerning adverse effects. The patient is feeling fine today with no concerning complaints. She had repeat CT scan of the chest as well as blood work performed recently.  I personally reviewed her scan and discussed the results with the patient today. Her scan showed no concerning findings for disease progression. I recommended for her to continue on observation with repeat blood work in 6 months. She was advised to call immediately if she has any concerning symptoms in the interval. The patient voices understanding of current disease status  and treatment options and is in agreement with the current care plan.  All questions were answered. The patient knows to call the clinic with any problems, questions or concerns. We can certainly see the patient much sooner if necessary.  Disclaimer: This note was dictated with voice recognition software. Similar sounding words can inadvertently be transcribed and may not be corrected upon review.

## 2020-02-11 ENCOUNTER — Telehealth: Payer: Self-pay | Admitting: Internal Medicine

## 2020-02-11 NOTE — Telephone Encounter (Signed)
Scheduled per los. Called and spoke with patient. Confirmed appt 

## 2020-08-12 ENCOUNTER — Inpatient Hospital Stay: Payer: Medicare PPO

## 2020-08-12 ENCOUNTER — Other Ambulatory Visit: Payer: Self-pay

## 2020-08-12 ENCOUNTER — Inpatient Hospital Stay: Payer: Medicare PPO | Attending: Internal Medicine | Admitting: Internal Medicine

## 2020-08-12 VITALS — BP 129/86 | HR 89 | Temp 97.4°F | Resp 17 | Ht 59.0 in | Wt 139.6 lb

## 2020-08-12 DIAGNOSIS — I10 Essential (primary) hypertension: Secondary | ICD-10-CM | POA: Diagnosis not present

## 2020-08-12 DIAGNOSIS — C8104 Nodular lymphocyte predominant Hodgkin lymphoma, lymph nodes of axilla and upper limb: Secondary | ICD-10-CM

## 2020-08-12 DIAGNOSIS — Z8571 Personal history of Hodgkin lymphoma: Secondary | ICD-10-CM | POA: Insufficient documentation

## 2020-08-12 DIAGNOSIS — E119 Type 2 diabetes mellitus without complications: Secondary | ICD-10-CM | POA: Insufficient documentation

## 2020-08-12 LAB — CMP (CANCER CENTER ONLY)
ALT: 18 U/L (ref 0–44)
AST: 15 U/L (ref 15–41)
Albumin: 4.2 g/dL (ref 3.5–5.0)
Alkaline Phosphatase: 107 U/L (ref 38–126)
Anion gap: 9 (ref 5–15)
BUN: 17 mg/dL (ref 8–23)
CO2: 24 mmol/L (ref 22–32)
Calcium: 9.6 mg/dL (ref 8.9–10.3)
Chloride: 109 mmol/L (ref 98–111)
Creatinine: 0.86 mg/dL (ref 0.44–1.00)
GFR, Estimated: >60 mL/min (ref >60)
Glucose, Bld: 233 mg/dL — ABNORMAL HIGH (ref 70–99)
Potassium: 4.3 mmol/L (ref 3.5–5.1)
Sodium: 142 mmol/L (ref 135–145)
Total Bilirubin: 0.6 mg/dL (ref 0.3–1.2)
Total Protein: 7.2 g/dL (ref 6.5–8.1)

## 2020-08-12 LAB — CBC WITH DIFFERENTIAL (CANCER CENTER ONLY)
Abs Immature Granulocytes: 0.01 10*3/uL (ref 0.00–0.07)
Basophils Absolute: 0 10*3/uL (ref 0.0–0.1)
Basophils Relative: 1 %
Eosinophils Absolute: 0.5 10*3/uL (ref 0.0–0.5)
Eosinophils Relative: 13 %
HCT: 38.7 % (ref 36.0–46.0)
Hemoglobin: 12.9 g/dL (ref 12.0–15.0)
Immature Granulocytes: 0 %
Lymphocytes Relative: 24 %
Lymphs Abs: 1 10*3/uL (ref 0.7–4.0)
MCH: 28.3 pg (ref 26.0–34.0)
MCHC: 33.3 g/dL (ref 30.0–36.0)
MCV: 84.9 fL (ref 80.0–100.0)
Monocytes Absolute: 0.4 10*3/uL (ref 0.1–1.0)
Monocytes Relative: 9 %
Neutro Abs: 2.1 10*3/uL (ref 1.7–7.7)
Neutrophils Relative %: 53 %
Platelet Count: 204 10*3/uL (ref 150–400)
RBC: 4.56 MIL/uL (ref 3.87–5.11)
RDW: 12.7 % (ref 11.5–15.5)
WBC Count: 4 10*3/uL (ref 4.0–10.5)
nRBC: 0 % (ref 0.0–0.2)

## 2020-08-12 LAB — LACTATE DEHYDROGENASE: LDH: 168 U/L (ref 98–192)

## 2020-08-12 NOTE — Progress Notes (Signed)
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  PRIOR THERAPY: Weekly Rituxan 375 mg/M2 for 4 weeks.  First dose expected April 29, 2019.  Status post 4 cycles.  Last dose was given May 20, 2019.  CURRENT THERAPY: Observation.  INTERVAL HISTORY: Katherine Barnett 80 y.o. female returns to the clinic today for 31-month follow-up visit.  The patient is feeling fine today with no concerning complaints.  She denied having any recent weight loss or night sweats.  She has no nausea, vomiting, diarrhea or constipation.  She denied having any chest pain, shortness of breath, cough or hemoptysis.  She is here today for evaluation and repeat blood work.  MEDICAL HISTORY: Past Medical History:  Diagnosis Date  . Complication of anesthesia    vomited after colonoscopy  . Diabetes mellitus without complication ([place])    type 2 - diet controlled  . Hypertension     ALLERGIES:  has No Known Allergies.  MEDICATIONS:  Current Outpatient Medications  Medication Sig Dispense Refill  . allopurinol (ZYLOPRIM) 100 MG tablet Take 1 tablet (100 mg total) by mouth 2 (two) times daily. 60 tablet 2  . aspirin 81 MG tablet Take 81 mg by mouth daily.    . calcium-vitamin D (OSCAL WITH D) 500-200 MG-UNIT per tablet Take 1 tablet by mouth daily with breakfast.    . losartan-hydrochlorothiazide (HYZAAR) 50-12.5 MG per tablet Take 1 tablet by mouth daily.    . metFORMIN (GLUCOPHAGE) 500 MG tablet Take 500 mg by mouth daily.    . prochlorperazine (COMPAZINE) 10 MG tablet Take 1 tablet (10 mg total) by mouth every 6 (six) hours as needed for nausea or vomiting.  (Patient not taking: Reported on 05/13/2019) 30 tablet 0  . simvastatin (ZOCOR) 20 MG tablet Take 20 mg by mouth at bedtime.    . traMADol (ULTRAM) 50 MG tablet Take 1 tablet (50 mg total) by mouth every 6 (six) hours as needed for moderate pain or severe pain. (Patient not taking: Reported on 05/13/2019) 25 tablet 0  . valACYclovir (VALTREX) 1000 MG tablet Take 500 mg by mouth 2 (two) times daily as needed (fever blisters).      No current facility-administered medications for this visit.    SURGICAL HISTORY:  Past Surgical History:  Procedure Laterality Date  . AXILLARY LYMPH NODE BIOPSY Left 03/12/2019   Procedure: EXCISIONAL BIOPSY LEFT AXILLARY LYMPH NODES;  Surgeon: Coralie Keens, MD;  Location: [facility];  Service: General;  Laterality: Left;  . BREAST EXCISIONAL BIOPSY Left    Lt axillary bx, was benign  . COLONOSCOPY    . EYE SURGERY Bilateral    cataract surgery with lens implant  . ORIF HUMERUS FRACTURE Right 12/22/2014   Procedure: OPEN REDUCTION INTERNAL FIXATION (ORIF) RIGHT DISTAL HUMERUS FRACTURE;  Surgeon: Mcarthur Rossetti, MD;  Location: [facility];  Service: Orthopedics;  Laterality: Right;    REVIEW OF SYSTEMS:  A comprehensive review of systems was negative.   PHYSICAL EXAMINATION: General appearance: alert, cooperative and no distress Head: Normocephalic, without obvious abnormality, atraumatic Neck: no adenopathy, no JVD, supple, symmetrical, trachea midline and thyroid not enlarged, symmetric, no tenderness/mass/nodules Lymph nodes: Cervical, supraclavicular, and axillary nodes  normal. Resp: clear to auscultation bilaterally Back: symmetric, no curvature. ROM normal. No CVA tenderness. Cardio: regular rate and rhythm, S1, S2 normal, no murmur, click, rub or gallop GI: soft, non-tender; bowel sounds normal; no masses,  no organomegaly Extremities: extremities normal, atraumatic, no cyanosis or edema  ECOG PERFORMANCE STATUS: 0 - Asymptomatic  Blood pressure  129/86, pulse 89, temperature (!) 97.4 F (36.3 C), temperature source Tympanic, resp. rate 17, height 4\' 11"  (1.499 m), weight 139 lb 9.6 oz (63.3 kg), SpO2 100 %.  LABORATORY DATA: Lab Results  Component Value Date   WBC 4.0 08/12/2020   HGB 12.9 08/12/2020   HCT 38.7 08/12/2020   MCV 84.9 08/12/2020   PLT 204 08/12/2020      Chemistry      Component Value Date/Time   NA 143 02/09/2020 1157   K 3.9 02/09/2020 1157   CL 109 02/09/2020 1157   CO2 25 02/09/2020 1157   BUN 16 02/09/2020 1157   CREATININE 0.82 02/09/2020 1157      Component Value Date/Time   CALCIUM 9.7 02/09/2020 1157   ALKPHOS 93 02/09/2020 1157   AST 16 02/09/2020 1157   ALT 17 02/09/2020 1157   BILITOT 0.6 02/09/2020 1157       RADIOGRAPHIC STUDIES: No results found.  ASSESSMENT AND PLAN: This is a very pleasant 80 years old African-American female recently diagnosed with a stage IA non-bulky Hodgkin lymphoma, lymphocyte predominant subtype diagnosed in September 2020 and presented with enlarged and hypermetabolic left retropectoral and left axillary lymph nodes. The patient is mildly symptomatic with mild pain in the left axilla. She underwent treatment with weekly Rituxan status post 4 cycles.  She tolerated her treatment well with no concerning adverse effects. The patient is currently on observation and she is feeling fine today with no concerning complaints. Repeat CBC today is unremarkable.  Comprehensive metabolic panel and LDH are still pending. I recommended for her to continue on observation with repeat blood work in 6 months. She was advised to call immediately if she has any concerning symptoms in the interval. The patient voices understanding of current disease status and treatment options and is in agreement with the current care plan.  All questions were answered. The patient knows to call the clinic with any problems, questions or concerns. We can certainly see the patient much sooner if  necessary.  Disclaimer: This note was dictated with voice recognition software. Similar sounding words can inadvertently be transcribed and may not be corrected upon review.

## 2020-10-05 ENCOUNTER — Telehealth: Payer: Self-pay | Admitting: Medical Oncology

## 2020-10-05 NOTE — Telephone Encounter (Signed)
[name]  just spoke to [place] and he asked her where she wants the PET scan  done and she said at [facility].  Per Dr Julien Nordmann , he spoke to Dr Irish Elders and they agreed she needs PET scan at [facility] [place].   I called Merrilee Seashore at [facility] and told him schedule PET at [facility].

## 2020-10-19 DIAGNOSIS — C8400 Mycosis fungoides, unspecified site: Secondary | ICD-10-CM | POA: Insufficient documentation

## 2020-11-19 ENCOUNTER — Other Ambulatory Visit: Payer: Self-pay | Admitting: Internal Medicine

## 2020-11-19 DIAGNOSIS — Z1231 Encounter for screening mammogram for malignant neoplasm of breast: Secondary | ICD-10-CM

## 2020-12-23 ENCOUNTER — Telehealth: Payer: Self-pay | Admitting: Cardiology

## 2021-01-12 ENCOUNTER — Ambulatory Visit
Admission: RE | Admit: 2021-01-12 | Discharge: 2021-01-12 | Disposition: A | Discharge status: Home / Self Care | Payer: Medicare PPO | Source: Ambulatory Visit | Attending: Internal Medicine | Admitting: Internal Medicine

## 2021-01-12 ENCOUNTER — Other Ambulatory Visit: Payer: Self-pay

## 2021-01-12 DIAGNOSIS — Z1231 Encounter for screening mammogram for malignant neoplasm of breast: Secondary | ICD-10-CM

## 2021-02-09 ENCOUNTER — Inpatient Hospital Stay: Payer: Medicare PPO | Attending: Internal Medicine | Admitting: Internal Medicine

## 2021-02-09 ENCOUNTER — Inpatient Hospital Stay: Payer: Medicare PPO

## 2021-02-09 ENCOUNTER — Encounter: Payer: Self-pay | Admitting: Internal Medicine

## 2021-02-09 ENCOUNTER — Other Ambulatory Visit: Payer: Self-pay

## 2021-02-09 VITALS — BP 125/85 | HR 85 | Temp 97.5°F | Resp 18 | Ht 59.0 in | Wt 141.6 lb

## 2021-02-09 DIAGNOSIS — E119 Type 2 diabetes mellitus without complications: Secondary | ICD-10-CM | POA: Diagnosis not present

## 2021-02-09 DIAGNOSIS — I10 Essential (primary) hypertension: Secondary | ICD-10-CM | POA: Insufficient documentation

## 2021-02-09 DIAGNOSIS — C8104 Nodular lymphocyte predominant Hodgkin lymphoma, lymph nodes of axilla and upper limb: Secondary | ICD-10-CM | POA: Diagnosis not present

## 2021-02-09 DIAGNOSIS — Z8571 Personal history of Hodgkin lymphoma: Secondary | ICD-10-CM | POA: Insufficient documentation

## 2021-02-09 DIAGNOSIS — C8400 Mycosis fungoides, unspecified site: Secondary | ICD-10-CM | POA: Insufficient documentation

## 2021-02-09 DIAGNOSIS — D72819 Decreased white blood cell count, unspecified: Secondary | ICD-10-CM | POA: Insufficient documentation

## 2021-02-09 DIAGNOSIS — Z79899 Other long term (current) drug therapy: Secondary | ICD-10-CM | POA: Diagnosis not present

## 2021-02-09 LAB — CBC WITH DIFFERENTIAL (CANCER CENTER ONLY)
Abs Immature Granulocytes: 0 10*3/uL (ref 0.00–0.07)
Basophils Absolute: 0 10*3/uL (ref 0.0–0.1)
Basophils Relative: 1 %
Eosinophils Absolute: 0.5 10*3/uL (ref 0.0–0.5)
Eosinophils Relative: 14 %
HCT: 37 % (ref 36.0–46.0)
Hemoglobin: 12.3 g/dL (ref 12.0–15.0)
Immature Granulocytes: 0 %
Lymphocytes Relative: 26 %
Lymphs Abs: 0.9 10*3/uL (ref 0.7–4.0)
MCH: 29.2 pg (ref 26.0–34.0)
MCHC: 33.2 g/dL (ref 30.0–36.0)
MCV: 87.9 fL (ref 80.0–100.0)
Monocytes Absolute: 0.4 10*3/uL (ref 0.1–1.0)
Monocytes Relative: 11 %
Neutro Abs: 1.7 10*3/uL (ref 1.7–7.7)
Neutrophils Relative %: 48 %
Platelet Count: 182 10*3/uL (ref 150–400)
RBC: 4.21 MIL/uL (ref 3.87–5.11)
RDW: 13.6 % (ref 11.5–15.5)
WBC Count: 3.4 10*3/uL — ABNORMAL LOW (ref 4.0–10.5)
nRBC: 0 % (ref 0.0–0.2)

## 2021-02-09 LAB — CMP (CANCER CENTER ONLY)
ALT: 21 U/L (ref 0–44)
AST: 19 U/L (ref 15–41)
Albumin: 4.3 g/dL (ref 3.5–5.0)
Alkaline Phosphatase: 75 U/L (ref 38–126)
Anion gap: 8 (ref 5–15)
BUN: 13 mg/dL (ref 8–23)
CO2: 28 mmol/L (ref 22–32)
Calcium: 9.8 mg/dL (ref 8.9–10.3)
Chloride: 106 mmol/L (ref 98–111)
Creatinine: 0.61 mg/dL (ref 0.44–1.00)
GFR, Estimated: >60 mL/min (ref >60)
Glucose, Bld: 195 mg/dL — ABNORMAL HIGH (ref 70–99)
Potassium: 3.8 mmol/L (ref 3.5–5.1)
Sodium: 142 mmol/L (ref 135–145)
Total Bilirubin: 0.6 mg/dL (ref 0.3–1.2)
Total Protein: 7 g/dL (ref 6.5–8.1)

## 2021-02-09 LAB — LACTATE DEHYDROGENASE: LDH: 162 U/L (ref 98–192)

## 2021-02-09 NOTE — Progress Notes (Signed)
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  PRIOR THERAPY: Weekly Rituxan 375 mg/M2 for 4 weeks.  First dose expected April 29, 2019.  Status post 4 cycles.  Last dose was given May 20, 2019.  CURRENT THERAPY: Methotrexate for the mycosis fungoides under the care of Dr. Jeannine Kitten..  INTERVAL HISTORY: Katherine Barnett 80 y.o. female returns to the clinic today for 36-month follow-up visit.  The patient is feeling fine today with no concerning complaints.  She is currently undergoing treatment with methotrexate for the mycosis fungoides under the care of Dr. Jeannine Kitten at [facility].  She has mild improvement of her rash.  She denied having any current chest pain, shortness of breath, cough or hemoptysis.  She has no nausea, vomiting, diarrhea or constipation.  She has no headache or visual changes.  She has no weight loss or night sweats.  She is here today for evaluation and repeat blood work.   MEDICAL HISTORY: Past Medical History:  Diagnosis Date   Complication of anesthesia    vomited after colonoscopy   Diabetes mellitus without complication ([place])    type 2 - diet controlled   Hypertension     ALLERGIES:  has No Known Allergies.  MEDICATIONS:  Current Outpatient Medications  Medication Sig Dispense Refill   aspirin 81 MG tablet Take 81 mg by mouth daily.     calcium-vitamin D (OSCAL WITH D) 500-200 MG-UNIT per tablet Take 1 tablet by mouth daily with breakfast.     folic acid (FOLVITE) 1 MG tablet Take one po on  days not taking methotrexate     hydrocortisone 2.5 % cream Apply 1-2 x per day on the week off of Triamcinolone     metFORMIN (GLUCOPHAGE) 500 MG tablet Take 500 mg by mouth daily.     methotrexate (RHEUMATREX) 2.5 MG tablet Take 3 tabs once po, check labs next week.  If ok, then 10 tabs po, check labs next week.  If ok, 10 tabs po each week     simvastatin (ZOCOR) 20 MG tablet Take 20 mg by mouth at bedtime.     spironolactone (ALDACTONE) 25 MG tablet Take 25 mg by mouth daily.     triamcinolone cream (KENALOG) 0.1 % Apply once a day to lesions daily for 2 weeks, one week off and repeat cycle     valACYclovir (VALTREX) 1000 MG tablet Take 500 mg by mouth 2 (two) times daily as needed (fever blisters).      No current facility-administered medications for this visit.    SURGICAL HISTORY:  Past Surgical History:  Procedure Laterality Date   AXILLARY LYMPH NODE BIOPSY Left 03/12/2019   Procedure: EXCISIONAL BIOPSY LEFT AXILLARY LYMPH NODES;  Surgeon: Coralie Keens, MD;  Location: [facility];  Service: General;  Laterality: Left;   BREAST EXCISIONAL BIOPSY Left    Lt axillary bx, was benign   COLONOSCOPY     EYE SURGERY Bilateral    cataract surgery with lens implant   ORIF HUMERUS FRACTURE Right 12/22/2014   Procedure: OPEN REDUCTION INTERNAL FIXATION (ORIF) RIGHT DISTAL  HUMERUS FRACTURE;  Surgeon: Mcarthur Rossetti, MD;  Location: [facility];  Service: Orthopedics;  Laterality: Right;    REVIEW OF SYSTEMS:  A comprehensive review of systems was negative.   PHYSICAL EXAMINATION: General appearance: alert, cooperative, and no distress Head: Normocephalic, without obvious abnormality, atraumatic Neck: no adenopathy, no JVD, supple, symmetrical, trachea midline, and thyroid not enlarged, symmetric, no tenderness/mass/nodules Lymph nodes: Cervical, supraclavicular, and axillary nodes normal. Resp: clear to auscultation bilaterally Back: symmetric, no curvature. ROM normal. No CVA  tenderness. Cardio: regular rate and rhythm, S1, S2 normal, no murmur, click, rub or gallop GI: soft, non-tender; bowel sounds normal; no masses,  no organomegaly Extremities: extremities normal, atraumatic, no cyanosis or edema  ECOG PERFORMANCE STATUS: 0 - Asymptomatic  Blood pressure 125/85, pulse 85, temperature (!) 97.5 F (36.4 C), temperature source Tympanic, resp. rate 18, height 4\' 11"  (1.499 m), weight 141 lb 9.6 oz (64.2 kg), SpO2 100 %.  LABORATORY DATA: Lab Results  Component Value Date   WBC 3.4 (L) 02/09/2021   HGB 12.3 02/09/2021   HCT 37.0 02/09/2021   MCV 87.9 02/09/2021   PLT 182 02/09/2021      Chemistry      Component Value Date/Time   NA 142 08/12/2020 1008   K 4.3 08/12/2020 1008   CL 109 08/12/2020 1008   CO2 24 08/12/2020 1008   BUN 17 08/12/2020 1008   CREATININE 0.86 08/12/2020 1008      Component Value Date/Time   CALCIUM 9.6 08/12/2020 1008   ALKPHOS 107 08/12/2020 1008   AST 15 08/12/2020 1008   ALT 18 08/12/2020 1008   BILITOT 0.6 08/12/2020 1008       RADIOGRAPHIC STUDIES: MM 3D SCREEN BREAST BILATERAL  Result Date: 01/12/2021 CLINICAL DATA:  Screening. EXAM: DIGITAL SCREENING BILATERAL MAMMOGRAM WITH TOMOSYNTHESIS AND CAD TECHNIQUE: Bilateral screening digital craniocaudal and mediolateral oblique mammograms were obtained. Bilateral screening digital breast tomosynthesis was performed. The images were evaluated with computer-aided detection. COMPARISON:  Previous exam(s). ACR Breast Density Category b: There are scattered areas of fibroglandular density. FINDINGS: There are no findings suspicious for malignancy. IMPRESSION: No mammographic evidence of malignancy. A result letter of this screening mammogram will be mailed directly to the patient. RECOMMENDATION: Screening mammogram in one year. (Code:SM-B-01Y) BI-RADS CATEGORY  1: Negative. Electronically Signed   By: Kristopher Oppenheim M.D.   On: 01/12/2021 15:55    ASSESSMENT AND PLAN: This  is a very pleasant 80 years old African-American female recently diagnosed with a stage IA non-bulky Hodgkin lymphoma, lymphocyte predominant subtype diagnosed in September 2020 and presented with enlarged and hypermetabolic left retropectoral and left axillary lymph nodes. She underwent treatment with weekly Rituxan status post 4 cycles.  She tolerated her treatment well with no concerning adverse effects. The patient was also recently diagnosed with mycosis fungoides and treated at [facility] under the care of Dr. Jeannine Kitten with methotrexate.  She is feeling fine with no concerning complaints. Repeat CBC today is unremarkable except for mild leukopenia.  Comprehensive metabolic panel and LDH are still pending. I recommended for the patient to continue on observation with repeat blood work in 6 months. She was advised to call if she has any concerning symptoms in the interval. The patient voices understanding of current disease status and treatment options and is in agreement with the current care plan.  All questions were answered. The patient knows to call the clinic with any problems, questions or concerns. We can certainly see the  patient much sooner if necessary.  Disclaimer: This note was dictated with voice recognition software. Similar sounding words can inadvertently be transcribed and may not be corrected upon review.

## 2021-02-11 ENCOUNTER — Telehealth: Payer: Self-pay | Admitting: Internal Medicine

## 2021-02-11 NOTE — Telephone Encounter (Signed)
Scheduled per los. Called and left msg. Mailed printout  °

## 2021-04-12 ENCOUNTER — Encounter (HOSPITAL_BASED_OUTPATIENT_CLINIC_OR_DEPARTMENT_OTHER): Payer: Self-pay | Admitting: Nurse Practitioner

## 2021-04-12 ENCOUNTER — Ambulatory Visit (HOSPITAL_BASED_OUTPATIENT_CLINIC_OR_DEPARTMENT_OTHER): Payer: Medicare PPO | Admitting: Nurse Practitioner

## 2021-04-12 ENCOUNTER — Other Ambulatory Visit: Payer: Self-pay

## 2021-04-12 VITALS — BP 126/84 | HR 96 | Ht 60.0 in | Wt 138.0 lb

## 2021-04-12 DIAGNOSIS — E1169 Type 2 diabetes mellitus with other specified complication: Secondary | ICD-10-CM

## 2021-04-12 DIAGNOSIS — E1165 Type 2 diabetes mellitus with hyperglycemia: Secondary | ICD-10-CM | POA: Diagnosis not present

## 2021-04-12 DIAGNOSIS — I152 Hypertension secondary to endocrine disorders: Secondary | ICD-10-CM | POA: Insufficient documentation

## 2021-04-12 DIAGNOSIS — M810 Age-related osteoporosis without current pathological fracture: Secondary | ICD-10-CM

## 2021-04-12 DIAGNOSIS — C8104 Nodular lymphocyte predominant Hodgkin lymphoma, lymph nodes of axilla and upper limb: Secondary | ICD-10-CM

## 2021-04-12 DIAGNOSIS — E1159 Type 2 diabetes mellitus with other circulatory complications: Secondary | ICD-10-CM | POA: Diagnosis not present

## 2021-04-12 DIAGNOSIS — C8400 Mycosis fungoides, unspecified site: Secondary | ICD-10-CM | POA: Diagnosis not present

## 2021-04-12 DIAGNOSIS — E782 Mixed hyperlipidemia: Secondary | ICD-10-CM

## 2021-04-12 NOTE — Assessment & Plan Note (Signed)
Currently controlled with diet.  On statin and BP well controlled at this time.  Recommend 1200 calorie diet with no more than 150g of carbs per day.  Most recent A1c reported at 7.3%, which is good control for a woman of her age with no medication.  Continue as planned and will monitor A1c in 3 months.

## 2021-04-12 NOTE — Assessment & Plan Note (Signed)
Followed by Dr. Earlie Server with oncology.  Labs and evaluation every 3 months.  No concerns present today.  Will follow.

## 2021-04-12 NOTE — Patient Instructions (Addendum)
Recommendations from today's visit: The new booster for COVID-19 is called the BIVALENT booster shot. I recommend this for the COVID vaccine series.  You can go to the pharmacy today to set up this appointment.   Information on diet, exercise, and health maintenance recommendations are listed below. This is information to help you be sure you are on track for optimal health and monitoring.   Please look over this and let us know if you have any questions or if you have completed any of the health maintenance outside of [facility] so that we can be sure your records are up to date.  ___________________________________________________________  Thank you for choosing [facility] at [facility] for your Primary Care needs. I am excited for the opportunity to partner with you to meet your health care goals. It was a pleasure meeting you today!  I am an Adult-Geriatric Nurse Practitioner with a background in caring for patients for more than 20 years. I provide primary care and sports medicine services to patients age 18 and older within this office. I am also the director of the APP Fellowship with [facility].   I am passionate about providing the best service to you through preventive medicine and supportive care. I consider you a part of the medical team and value your input. I work diligently to ensure that you are heard and your needs are met in a safe and effective manner. I want you to feel comfortable with me as your provider and want you to know that your health concerns are important to me.  For your information, our office hours are Monday- Friday 8:00 AM - 5:00 PM At this time I am not in the office on Wednesdays.  If you have questions or concerns, please call our office at [phone] or send [place] a MyChart message and we will respond as quickly as possible.   For all urgent or time sensitive needs we ask that you please call the office to avoid delays. MyChart is  not constantly monitored and replies may take up to 72 business hours.  MyChart Policy: MyChart allows for you to see your visit notes, after visit summary, provider recommendations, lab and tests results, make an appointment, request refills, and contact your provider or the office for non-urgent questions or concerns. Providers are seeing patients during normal business hours and do not have built in time to review MyChart messages.  We ask that you allow a minimum of 4 business days for responses to [employer]. For this reason, please do not send urgent requests through [place]. Please call the office at [phone]. Complex MyChart concerns may require a visit. Your provider may request you schedule a virtual or in person visit to ensure we are providing the best care possible. MyChart messages sent after 4:00 PM on Friday will not be received by the provider until Monday morning.    Lab and Test Results: You will receive your lab and test results on MyChart as soon as they are completed and results have been sent by the lab or testing facility. Due to this service, you will receive your results BEFORE your provider.  I review lab and tests results each morning prior to seeing patients. Some results require collaboration with other providers to ensure you are receiving the most appropriate care. For this reason, we ask that you please allow a minimum of 4 business days for your provider to receive and review lab and test results and contact  you about these.  Most lab and test result comments from the provider will be sent through [place]. Your provider may recommend changes to the plan of care, follow-up visits, repeat testing, ask questions, or request an office visit to discuss these results. You may reply directly to this message or call the office at [phone] to provide information for the provider or set up an appointment. In some instances, you will be called with test results and  recommendations. Please let us know if this is preferred and we will make note of this in your chart to provide this for you.    If you have not heard a response to your lab or test results in 72 business hours, please call the office to let us know.   After Hours: For all non-emergency after hours needs, please call the office at [phone] and select the option to reach the on-call provider service. On-call services are shared between multiple [facility] offices and therefore it will not be possible to speak directly with your provider. On-call providers may provide medical advice and recommendations, but are unable to provide refills for maintenance medications.  For all emergency or urgent medical needs after normal business hours, we recommend that you seek care at the closest Urgent Care or Emergency Department to ensure appropriate treatment in a timely manner.  [facility] [place] has a 24 hour emergency room located on the ground floor for your convenience.    Please do not hesitate to reach out to [place] with concerns.   Thank you, again, for choosing me as your health care partner. I appreciate your trust and look forward to learning more about you.   [name], DNP, AGNP-c ___________________________________________________________  Health Maintenance Recommendations Screening Testing Mammogram Every 1 -2 years based on history and risk factors Starting at age 28 Pap Smear Ages 21-39 every 3 years Ages 31-65 every 5 years with HPV testing More frequent testing may be required based on results and history Colon Cancer Screening Every 1-10 years based on test performed, risk factors, and history Starting at age 28 Bone Density Screening Every 2-10 years based on history Starting at age 35 for women Recommendations for men differ based on medication usage, history, and risk factors AAA Screening One time ultrasound Men 19-32 years old who have every  smoked Lung Cancer Screening Low Dose Lung CT every 12 months Age 3-80 years with a 30 pack-year smoking history who still smoke or who have quit within the last 15 years  Screening Labs Routine  Labs: Complete Blood Count (CBC), Complete Metabolic Panel (CMP), Cholesterol (Lipid Panel) Every 6-12 months based on history and medications May be recommended more frequently based on current conditions or previous results Hemoglobin A1c Lab Every 3-12 months based on history and previous results Starting at age 32 or earlier with diagnosis of diabetes, high cholesterol, BMI >26, and/or risk factors Frequent monitoring for patients with diabetes to ensure blood sugar control Thyroid Panel (TSH w/ T3 & T4) Every 6 months based on history, symptoms, and risk factors May be repeated more often if on medication HIV One time testing for all patients 77 and older May be repeated more frequently for patients with increased risk factors or exposure Hepatitis C One time testing for all patients 45 and older May be repeated more frequently for patients with increased risk factors or exposure Gonorrhea, Chlamydia Every 12 months for all sexually active persons 13-24 years Additional monitoring may be recommended for those  who are considered high risk or who have symptoms PSA Men 10-26 years old with risk factors Additional screening may be recommended from age 26-69 based on risk factors, symptoms, and history  Vaccine Recommendations Tetanus Booster All adults every 10 years Flu Vaccine All patients 6 months and older every year COVID Vaccine All patients 12 years and older Initial dosing with booster May recommend additional booster based on age and health history HPV Vaccine 2 doses all patients age 9-26 Dosing may be considered for patients over 26 Shingles Vaccine (Shingrix) 2 doses all adults 62 years and older Pneumonia (Pneumovax 23) All adults 62 years and older May recommend  earlier dosing based on health history Pneumonia (Prevnar 8) All adults 48 years and older Dosed 1 year after Pneumovax 23  Additional Screening, Testing, and Vaccinations may be recommended on an individualized basis based on family history, health history, risk factors, and/or exposure.  __________________________________________________________  Diet Recommendations for All Patients  I recommend that all patients maintain a diet low in saturated fats, carbohydrates, and cholesterol. While this can be challenging at first, it is not impossible and small changes can make big differences.  Things to try: Decreasing the amount of soda, sweet tea, and/or juice to one or less per day and replace with water While water is always the first choice, if you do not like water you may consider adding a water additive without sugar to improve the taste other sugar free drinks Replace potatoes with a brightly colored vegetable at dinner Use healthy oils, such as canola oil or olive oil, instead of butter or hard margarine Limit your bread intake to two pieces or less a day Replace regular pasta with low carb pasta options Bake, broil, or grill foods instead of frying Monitor portion sizes  Eat smaller, more frequent meals throughout the day instead of large meals  An important thing to remember is, if you love foods that are not great for your health, you don't have to give them up completely. Instead, allow these foods to be a reward when you have done well. Allowing yourself to still have special treats every once in a while is a nice way to tell yourself thank you for working hard to keep yourself healthy.   Also remember that every day is a new day. If you have a bad day and "fall off the wagon", you can still climb right back up and keep moving along on your journey!  We have resources available to help you!  Some websites that may be helpful  include: www.http://carter.biz/  Www.VeryWellFit.com _____________________________________________________________  Activity Recommendations for All Patients  I recommend that all adults get at least 20 minutes of moderate physical activity that elevates your heart rate at least 5 days out of the week.  Some examples include: Walking or jogging at a pace that allows you to carry on a conversation Cycling (stationary bike or outdoors) Water aerobics Yoga Weight lifting Dancing If physical limitations prevent you from putting stress on your joints, exercise in a pool or seated in a chair are excellent options.  Do determine your MAXIMUM heart rate for activity: YOUR AGE - 220 = MAX HeartRate   Remember! Do not push yourself too hard.  Start slowly and build up your pace, speed, weight, time in exercise, etc.  Allow your body to rest between exercise and get good sleep. You will need more water than normal when you are exerting yourself. Do not wait until you are thirsty  to drink. Drink with a purpose of getting in at least 8, 8 ounce glasses of water a day plus more depending on how much you exercise and sweat.    If you begin to develop dizziness, chest pain, abdominal pain, jaw pain, shortness of breath, headache, vision changes, lightheadedness, or other concerning symptoms, stop the activity and allow your body to rest. If your symptoms are severe, seek emergency evaluation immediately. If your symptoms are concerning, but not severe, please let us know so that we can recommend further evaluation.   ________________________________________________________________

## 2021-04-12 NOTE — Progress Notes (Signed)
Orma Render, DNP, AGNP-c [facility] Sports Medicine [address]  [place], [place] [address] [phone]  New patient visit   Patient: Katherine Barnett   DOB: 03/15/1941   80 y.o. Female  MRN: [id number] Visit Date: 04/12/2021  Patient Care Team: Darrow Barreiro, Coralee Pesa, NP as PCP - General (Nurse Practitioner) Renaldo Harrison, MD as Referring Physician (Dermatology) Curt Bears, MD as Consulting Physician (Oncology)  Today's healthcare provider: Orma Render, NP   Chief Complaint  Patient presents with  . Establish Care    Former PCP Dr. Altha Harm - Notes in [place]. Dermatology - [facility] - Dr. Astrid Divine. Oncology - Dr. Julien Nordmann Notes in [place].   No concerns or complaints today. Transferring care because prior PCP is in [place] and patient doesn't want to commute anymore.   Subjective    Katherine Barnett is a 80 y.o. female who presents today as a new patient to establish care.  HPI HPI     Establish Care    Additional comments: Former PCP Dr. Altha Harm - Notes in [place]. Dermatology - [facility] - Dr. Astrid Divine. Oncology - Dr. Julien Nordmann Notes in [place].   No concerns or complaints today. Transferring care because prior PCP is in [place] and patient doesn't want to commute anymore.      Last edited by Bobby Rumpf, CMA on 04/12/2021  1:48 PM.      CPE last week.  Ready for covid booster. Initial vaccines 11/07/2019 and 12/02/2019 both pfizer. Will plan for shingles vaccine one covid booster has been received.  Last DEXA 2019 Mammogram June 2022  DM Controls diabetes with diet. Most recent A1c down to 7.3%. She is working on getting below 7. No concerns present today.   HTN Well controlled on losartan.  Denies and side effects of medication or concerns with BP today.   HLD Well controlled with simvastatin.  Denies side effects of medication.   Active melanoma, mycosis fungoid's, and hodgkin's lymphoma.  Taking methotrexate 25mg  every Monday with folic acid 1mg  every other day of the week. Blood work every 2 months. Followed by dermatology- Dr. Irish Elders and oncology- Dr. Earlie Server.  Past Medical History:  Diagnosis Date  . Complication of anesthesia    vomited after colonoscopy  . Diabetes mellitus without complication ([place])    type 2 - diet controlled  . Hodgkin's lymphoma ([place])   . Hypertension   . Melanoma ([place])   . Multinodular goiter (nontoxic)   . 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 Osteoporosis   . Pseudophakia of left eye    Past Surgical History:  Procedure Laterality Date  . AXILLARY LYMPH NODE BIOPSY Left 03/12/2019   Procedure: EXCISIONAL BIOPSY LEFT AXILLARY LYMPH NODES;  Surgeon: Coralie Keens, MD;  Location: [facility];  Service: General;  Laterality: Left;  . BREAST EXCISIONAL BIOPSY Left    Lt axillary bx, was benign  . COLONOSCOPY  10/11/2016   repeat in 10 yrs  . EYE SURGERY Bilateral    cataract surgery with lens implant  . ORIF HUMERUS FRACTURE Right 12/22/2014   Procedure: OPEN REDUCTION INTERNAL FIXATION (ORIF) RIGHT DISTAL HUMERUS FRACTURE;  Surgeon: Mcarthur Rossetti, MD;  Location: [facility];  Service: Orthopedics;  Laterality: Right;   Family Status  Relation Name Status  . Mother  Deceased  . Father  Deceased   History reviewed. No pertinent family history. Social History   Socioeconomic History  . Marital status: Widowed    Spouse  name: Not on file  . Number of children: Not on file  . Years of education: Not on file  . Highest education level: Not on file  Occupational History  . Not on file  Tobacco Use  . Smoking status: Former    Types: Cigarettes    Quit date: 12/17/1984    Years since quitting: 36.3  . Smokeless tobacco: Never  Vaping Use  . Vaping Use: Never used  Substance and Sexual Activity  . Alcohol use: No  . Drug use: No  . Sexual activity: Not Currently  Other Topics Concern  . Not on file  Social History Narrative  . Not on  file   Social Determinants of Health   Financial Resource Strain: Not on file  Food Insecurity: Not on file  Transportation Needs: Not on file  Physical Activity: Not on file  Stress: Not on file  Social Connections: Not on file   Outpatient Medications Prior to Visit  Medication Sig  . aspirin 81 MG tablet Take 81 mg by mouth daily.  . calcium-vitamin D (OSCAL WITH D) 500-200 MG-UNIT per tablet Take 1 tablet by mouth daily with breakfast.  . folic acid (FOLVITE) 1 MG tablet Take one po on days not taking methotrexate  . hydrochlorothiazide (HYDRODIURIL) 25 MG tablet Take 25 mg by mouth daily.  . hydrocortisone 2.5 % cream Apply 1-2 x per day on the week off of Triamcinolone  . losartan (COZAAR) 25 MG tablet Take 25 mg by mouth daily.  . methotrexate (RHEUMATREX) 2.5 MG tablet Take 3 tabs once po, check labs next week.  If ok, then 10 tabs po, check labs next week.  If ok, 10 tabs po each week  . simvastatin (ZOCOR) 20 MG tablet Take 20 mg by mouth at bedtime.  . triamcinolone cream (KENALOG) 0.1 % Apply once a day to lesions daily for 2 weeks, one week off and repeat cycle  . valACYclovir (VALTREX) 1000 MG tablet Take 500 mg by mouth 2 (two) times daily as needed (fever blisters).   . [DISCONTINUED] metFORMIN (GLUCOPHAGE) 500 MG tablet Take 500 mg by mouth daily.  Marland Kitchen spironolactone (ALDACTONE) 25 MG tablet Take 25 mg by mouth daily. (Patient not taking: Reported on 04/12/2021)   No facility-administered medications prior to visit.   No Known Allergies  Immunization History  Administered Date(s) Administered  . PFIZER(Purple Top)SARS-COV-2 Vaccination 11/07/2019, 12/02/2019  . Tdap 12/17/2014    Health Maintenance  Topic Date Due  . FOOT EXAM  Never done  . OPHTHALMOLOGY EXAM  Never done  . Zoster Vaccines- Shingrix (1 of 2) Never done  . HEMOGLOBIN A1C  09/05/2019  . COVID-19 Vaccine (3 - Pfizer risk series) 12/30/2019  . INFLUENZA VACCINE  05/09/2021 (Originally 02/21/2021)   . TETANUS/TDAP  12/16/2024  . DEXA SCAN  Completed  . Hepatitis C Screening  Completed  . HPV VACCINES  Aged Out    Patient Care Team: Tyasia Packard, Coralee Pesa, NP as PCP - General (Nurse Practitioner) Renaldo Harrison, MD as Referring Physician (Dermatology) Curt Bears, MD as Consulting Physician (Oncology)  Review of Systems All review of systems negative except what is listed in the HPI    Objective    BP 126/84   Pulse 96   Ht 5' (1.524 m)   Wt 138 lb (62.6 kg)   SpO2 100%   BMI 26.95 kg/m  Physical Exam Vitals and nursing note reviewed.  Constitutional:      Appearance: Normal appearance. She  is normal weight.  HENT:     Head: Normocephalic.  Eyes:     Extraocular Movements: Extraocular movements intact.     Conjunctiva/sclera: Conjunctivae normal.     Pupils: Pupils are equal, round, and reactive to light.  Cardiovascular:     Rate and Rhythm: Normal rate and regular rhythm.     Pulses: Normal pulses.     Heart sounds: Normal heart sounds.  Pulmonary:     Effort: Pulmonary effort is normal.     Breath sounds: Normal breath sounds.  Abdominal:     General: Abdomen is flat. Bowel sounds are normal.     Palpations: Abdomen is soft.  Musculoskeletal:        General: Normal range of motion.     Cervical back: Normal range of motion.     Right lower leg: No edema.     Left lower leg: No edema.  Skin:    General: Skin is warm and dry.     Capillary Refill: Capillary refill takes less than 2 seconds.  Neurological:     General: No focal deficit present.     Mental Status: She is alert and oriented to person, place, and time.  Psychiatric:        Mood and Affect: Mood normal.        Behavior: Behavior normal.        Thought Content: Thought content normal.        Judgment: Judgment normal.     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 Earlie Server with oncology.  Labs and evaluation every 3 months.  No concerns present today.  Will follow.       Type 2 diabetes mellitus with hyperglycemia (H[facility])    Currently controlled with diet.  On statin and BP well controlled at this time.  Recommend 1200 calorie diet with no more than 150g of carbs per day.  Most recent A1c reported at 7.3%, which is good control for a woman of her age with no medication.  Continue as planned and will monitor A1c in 3 months.       Relevant Medications   losartan (COZAAR) 25 MG tablet   Hypertension associated with diabetes ([facility] [facility]) - Primary    BP well controlled today. Continue losartan and hctz as directed. Unclear at this time if she is taking aldactone. Will review records from previous provider and update medication list as necessary.  F/U in 3 months.       Relevant Medications   losartan (COZAAR) 25 MG tablet   hydrochlorothiazide (HYDRODIURIL) 25 MG tablet   DM type 2 with diabetic mixed hyperlipidemia (HCC)    HLD in setting of DM2. Will review labs from previous provider and make changes to plan of care as necessary.  Just had wellness visit last week- will not repeat labs today.  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 Irish Elders with [facility] Currently treated with methotrexate 25mg  every Monday and 1mg  Folic Acid every other day of the week.  Dr. Irish Elders following labs every 2 months.  At this time no concerning findings. Will continue to monitor.       Osteoporosis    Last Dexa in 2019 with report of osteoporosis Currently on calcium and  vitamin d supplement No falls or concerning findings today Fall risk completed and education provided         Return in about 3 months (around 07/12/2021) for DM, HTN.    Time: 80 minutes, >50% spent counseling, care coordination, chart review, and documentation.   Orma Render, NP  [facility] [phone] (phone) [phone] (fax)  [place]

## 2021-04-12 NOTE — Assessment & Plan Note (Signed)
Followed by Dr. Irish Elders with [facility] Currently treated with methotrexate 25mg  every Monday and 1mg  Folic Acid every other day of the week.  Dr. Irish Elders following labs every 2 months.  At this time no concerning findings. Will continue to monitor.

## 2021-04-12 NOTE — Assessment & Plan Note (Signed)
HLD in setting of DM2. Will review labs from previous provider and make changes to plan of care as necessary.  Just had wellness visit last week- will not repeat labs today.  Continue low saturated fat diet F/U in 3 months

## 2021-04-12 NOTE — Assessment & Plan Note (Signed)
Last Dexa in 2019 with report of osteoporosis Currently on calcium and vitamin d supplement No falls or concerning findings today Fall risk completed and education provided

## 2021-04-12 NOTE — Assessment & Plan Note (Signed)
BP well controlled today. Continue losartan and hctz as directed. Unclear at this time if she is taking aldactone. Will review records from previous provider and update medication list as necessary.  F/U in 3 months.

## 2021-05-09 ENCOUNTER — Ambulatory Visit: Payer: Medicare PPO | Attending: Internal Medicine

## 2021-05-09 ENCOUNTER — Other Ambulatory Visit (HOSPITAL_BASED_OUTPATIENT_CLINIC_OR_DEPARTMENT_OTHER): Payer: Self-pay

## 2021-05-09 ENCOUNTER — Encounter: Payer: Self-pay | Admitting: Internal Medicine

## 2021-05-09 DIAGNOSIS — Z23 Encounter for immunization: Secondary | ICD-10-CM

## 2021-05-09 MED ORDER — PFIZER COVID-19 VAC BIVALENT 30 MCG/0.3ML IM SUSP
INTRAMUSCULAR | 0 refills | Status: DC
  Filled 2021-05-09: qty 0.3, 1d supply, fill #0

## 2021-05-09 NOTE — Progress Notes (Signed)
   Covid-19 Vaccination Clinic  Name:  Assia Meanor    MRN: [id number] DOB: 1940-12-05  05/09/2021  Ms. Garske was observed post Covid-19 immunization for 15 minutes without incident. She was provided with Vaccine Information Sheet and instruction to access the V-Safe system.   Ms. Wince was instructed to call 911 with any severe reactions post vaccine: Difficulty breathing  Swelling of face and throat  A fast heartbeat  A bad rash all over body  Dizziness and weakness

## 2021-07-05 ENCOUNTER — Ambulatory Visit (HOSPITAL_BASED_OUTPATIENT_CLINIC_OR_DEPARTMENT_OTHER): Payer: Medicare PPO | Admitting: Nurse Practitioner

## 2021-07-05 ENCOUNTER — Other Ambulatory Visit: Payer: Self-pay

## 2021-07-05 ENCOUNTER — Encounter (HOSPITAL_BASED_OUTPATIENT_CLINIC_OR_DEPARTMENT_OTHER): Payer: Self-pay | Admitting: Nurse Practitioner

## 2021-07-05 VITALS — BP 128/72 | HR 94 | Ht 59.0 in | Wt 137.6 lb

## 2021-07-05 DIAGNOSIS — I152 Hypertension secondary to endocrine disorders: Secondary | ICD-10-CM

## 2021-07-05 DIAGNOSIS — E1159 Type 2 diabetes mellitus with other circulatory complications: Secondary | ICD-10-CM | POA: Diagnosis not present

## 2021-07-05 DIAGNOSIS — E1169 Type 2 diabetes mellitus with other specified complication: Secondary | ICD-10-CM

## 2021-07-05 DIAGNOSIS — E1165 Type 2 diabetes mellitus with hyperglycemia: Secondary | ICD-10-CM | POA: Diagnosis not present

## 2021-07-05 DIAGNOSIS — E782 Mixed hyperlipidemia: Secondary | ICD-10-CM

## 2021-07-05 NOTE — Progress Notes (Signed)
Established Patient Office Visit  Subjective:  Patient ID: Katherine Barnett, female    DOB: [birth year]  Age: 80 y.o. MRN: [id number]  CC:  Chief Complaint  Patient presents with   Follow-up    Patient is here for 3 month follow up of blood pressure and diabetes. She is checking her blood sugar at home it is running 71-79 daily. Her blood pressure is good at home. No refills needed today. She is not interested in the flu shot.     HPI Katherine Barnett presents for follow-up for DM and HTN.  Katherine Barnett reports she is doing well with her management.  She has been checking her BG every morning and it is running in the 70's fasting.  She has also been checking her BP and it has been less than 130/8. She has no increased hunger, thirst, urination, slow healing wounds, yeast infections, N/V/D, swelling in LE, or vision changes.  Her last vision exam was this year with [facility] and it was good.  She is taking her medication as prescribed and monitoring her diet closely.  [facility] [employer] [facility] when [place] hit. She is currently preparing to return to exercise class at the local community center near her home to take Zumba classes and possibly belly dancing. She has no SE of medication.  No CP, palpitations or ShOB.   She currently lives with her brother and sister in law who help her.   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[place])    Osteoporosis    Pseudophakia of left eye     Past Surgical History:  Procedure Laterality Date   AXILLARY LYMPH NODE BIOPSY Left 03/12/2019   Procedure: EXCISIONAL BIOPSY LEFT AXILLARY LYMPH NODES;  Surgeon: Coralie Keens, MD;  Location: [facility];   Service: General;  Laterality: Left;   BREAST EXCISIONAL BIOPSY Left    Lt axillary bx, was benign   COLONOSCOPY  10/11/2016   repeat in 10 yrs   EYE SURGERY Bilateral    cataract surgery with lens implant   ORIF HUMERUS FRACTURE Right 12/22/2014   Procedure: OPEN REDUCTION INTERNAL FIXATION (ORIF) RIGHT DISTAL HUMERUS FRACTURE;  Surgeon: Mcarthur Rossetti, MD;  Location: [facility];  Service: Orthopedics;  Laterality: Right;    No family history on file.  Social History   Socioeconomic History   Marital status: Widowed    Spouse name: Not on file   Number of children: Not on file   Years of education: Not on file   Highest education level: Not on file  Occupational History   Not on file  Tobacco Use   Smoking status: Former    Types: Cigarettes    Quit date: 12/17/1984    Years since quitting: 36.5   Smokeless tobacco: Never  Vaping Use   Vaping Use: Never used  Substance and Sexual Activity   Alcohol use: No   Drug use: No   Sexual activity: Not Currently  Other Topics Concern   Not on file  Social History Narrative   Not on file   Social Determinants of Health   Financial Resource Strain: Not on file  Food Insecurity: Not on file  Transportation Needs: Not on file  Physical Activity: Not on file  Stress: Not on file  Social Connections: Not on file  Intimate Partner Violence: Not on file    Outpatient Medications Prior to Visit  Medication Sig Dispense Refill   aspirin 81 MG tablet Take 81 mg by mouth daily.     calcium-vitamin D (OSCAL WITH D) 500-200 MG-UNIT per tablet Take 1 tablet by mouth daily with breakfast.     COVID-19 mRNA bivalent vaccine, Pfizer, (PFIZER COVID-19 VAC BIVALENT) injection Inject into the muscle. 0.3 mL 0   folic acid (FOLVITE) 1 MG tablet Take one po on days not taking methotrexate     hydrochlorothiazide (HYDRODIURIL) 25 MG tablet Take 25 mg by mouth daily.     hydrocortisone 2.5 % cream Apply 1-2 x per day on the week off of  Triamcinolone     losartan (COZAAR) 25 MG tablet Take 25 mg by mouth daily.     methotrexate (RHEUMATREX) 2.5 MG tablet Take 3 tabs once po, check labs next week.  If ok, then 10 tabs po, check labs next week.  If ok, 10 tabs po each week     simvastatin (ZOCOR) 20 MG tablet Take 20 mg by mouth at bedtime.     triamcinolone cream (KENALOG) 0.1 % Apply once a day to lesions daily for 2 weeks, one week off and repeat cycle     valACYclovir (VALTREX) 1000 MG tablet Take 500 mg by mouth 2 (two) times daily as needed (fever blisters).      spironolactone (ALDACTONE) 25 MG tablet Take 25 mg by mouth daily. (Patient not taking: Reported on 04/12/2021)     No facility-administered medications prior to visit.    No Known Allergies  ROS Review of Systems All review of systems negative except what is listed in the HPI    Objective:    Physical Exam Vitals and nursing note reviewed.  Constitutional:      Appearance: Normal appearance.  HENT:     Head: Normocephalic.  Eyes:     Extraocular Movements: Extraocular movements intact.     Conjunctiva/sclera: Conjunctivae normal.     Pupils: Pupils are equal, round, and reactive to light.  Neck:     Vascular: No carotid bruit.  Cardiovascular:     Rate and Rhythm: Normal rate and regular rhythm.     Pulses: Normal pulses.     Heart sounds: Normal heart sounds.  Pulmonary:     Effort: Pulmonary effort is normal.     Breath sounds: Normal breath sounds.  Musculoskeletal:     Cervical back: Normal range of motion.     Right lower leg: No edema.     Left lower leg: No edema.  Skin:    General: Skin is warm and dry.     Capillary Refill: Capillary refill takes less than 2 seconds.  Neurological:     General: No focal deficit present.     Mental Status: She is alert and oriented to person, place, and time.  Psychiatric:        Mood and Affect: Mood normal.        Behavior: Behavior normal.        Thought Content: Thought content normal.         Judgment: Judgment normal.    BP 128/72   Pulse 94   Ht 4\' 11"  (1.499 m)   Wt 137 lb 9.6 oz (62.4 kg)  SpO2 99%   BMI 27.79 kg/m  Wt Readings from Last 3 Encounters:  07/05/21 137 lb 9.6 oz (62.4 kg)  04/12/21 138 lb (62.6 kg)  02/09/21 141 lb 9.6 oz (64.2 kg)     Health Maintenance Due  Topic Date Due   Pneumonia Vaccine 10+ Years old (1 - PCV) Never done   FOOT EXAM  Never done   OPHTHALMOLOGY EXAM  Never done   Zoster Vaccines- Shingrix (1 of 2) Never done   HEMOGLOBIN A1C  09/05/2019   COVID-19 Vaccine (4 - Booster for Pfizer series) 07/04/2021    There are no preventive care reminders to display for this patient.  No results found for: TSH Lab Results  Component Value Date   WBC 3.4 (L) 02/09/2021   HGB 12.3 02/09/2021   HCT 37.0 02/09/2021   MCV 87.9 02/09/2021   PLT 182 02/09/2021   Lab Results  Component Value Date   NA 142 02/09/2021   K 3.8 02/09/2021   CO2 28 02/09/2021   GLUCOSE 195 (H) 02/09/2021   BUN 13 02/09/2021   CREATININE 0.61 02/09/2021   BILITOT 0.6 02/09/2021   ALKPHOS 75 02/09/2021   AST 19 02/09/2021   ALT 21 02/09/2021   PROT 7.0 02/09/2021   ALBUMIN 4.3 02/09/2021   CALCIUM 9.8 02/09/2021   ANIONGAP 8 02/09/2021   No results found for: CHOL No results found for: HDL No results found for: LDLCALC No results found for: TRIG No results found for: CHOLHDL Lab Results  Component Value Date   HGBA1C 7.0 (H) 03/05/2019      Assessment & Plan:   Problem List Items Addressed This Visit     Type 2 diabetes mellitus with hyperglycemia ([facility]) - Primary    Currently controlling very well with diet and activity.  Will obtain A1c today No concerning findings present- no alarm symptoms. At home readings appropriate Recommend continue to monitor diet and exercise. Encouraged patient to restart community exercise for social and mental wellbeing.  She is really doing fantastic. I am very pleased with her control.  Will f/u in 6  months or sooner if needed.       Relevant Orders   Comprehensive metabolic panel   CBC with Differential/Platelet   Lipid panel   Hemoglobin A1c   POCT UA - Microalbumin   Hypertension associated with diabetes ([facility])    BP well controlled today.  Will obtain labs for monitoring.  She is doing fantastic with her routine.  Encouraged patient to become actively involved in community classes for both physical and mental wellbeing.  We will plan to follow-up in 6 months for repeat check or sooner if needed.       Relevant Orders   Comprehensive metabolic panel   CBC with Differential/Platelet   Lipid panel   Hemoglobin A1c   POCT UA - Microalbumin   DM type 2 with diabetic mixed hyperlipidemia ([facility])    Will monitor labs today She is doing well with diet and exercise.  Encouraged starting group/community exercise again to help with both mental and physical health.  Will plan to f/u in 6 months or sooner if needed.       Relevant Orders   Comprehensive metabolic panel   CBC with Differential/Platelet   Lipid panel   Hemoglobin A1c   POCT UA - Microalbumin    No orders of the defined types were placed in this encounter.   Follow-up: Return in about 6 months (around 01/03/2022) for  DM, HTN.    Orma Render, NP

## 2021-07-05 NOTE — Patient Instructions (Signed)
Merry Christmas!!!  Enjoy your dance classes, I cannot wait to hear about them!

## 2021-07-05 NOTE — Assessment & Plan Note (Signed)
Will monitor labs today She is doing well with diet and exercise.  Encouraged starting group/community exercise again to help with both mental and physical health.  Will plan to f/u in 6 months or sooner if needed.

## 2021-07-05 NOTE — Assessment & Plan Note (Signed)
BP well controlled today.  Will obtain labs for monitoring.  She is doing fantastic with her routine.  Encouraged patient to become actively involved in community classes for both physical and mental wellbeing.  We will plan to follow-up in 6 months for repeat check or sooner if needed.

## 2021-07-05 NOTE — Assessment & Plan Note (Signed)
Currently controlling very well with diet and activity.  Will obtain A1c today No concerning findings present- no alarm symptoms. At home readings appropriate Recommend continue to monitor diet and exercise. Encouraged patient to restart community exercise for social and mental wellbeing.  She is really doing fantastic. I am very pleased with her control.  Will f/u in 6 months or sooner if needed.

## 2021-07-06 ENCOUNTER — Encounter: Payer: Self-pay | Admitting: Internal Medicine

## 2021-07-06 LAB — COMPREHENSIVE METABOLIC PANEL
ALT: 18 IU/L (ref 0–32)
AST: 18 IU/L (ref 0–40)
Albumin/Globulin Ratio: 2.6 — ABNORMAL HIGH (ref 1.2–2.2)
Albumin: 4.9 g/dL — ABNORMAL HIGH (ref 3.7–4.7)
Alkaline Phosphatase: 103 IU/L (ref 44–121)
BUN/Creatinine Ratio: 18 (ref 12–28)
BUN: 13 mg/dL (ref 8–27)
Bilirubin Total: 0.6 mg/dL (ref 0.0–1.2)
CO2: 22 mmol/L (ref 20–29)
Calcium: 9.7 mg/dL (ref 8.7–10.3)
Chloride: 107 mmol/L — ABNORMAL HIGH (ref 96–106)
Creatinine, Ser: 0.71 mg/dL (ref 0.57–1.00)
Globulin, Total: 1.9 g/dL (ref 1.5–4.5)
Glucose: 155 mg/dL — ABNORMAL HIGH (ref 70–99)
Potassium: 4.2 mmol/L (ref 3.5–5.2)
Sodium: 143 mmol/L (ref 134–144)
Total Protein: 6.8 g/dL (ref 6.0–8.5)
eGFR: 87 mL/min/{1.73_m2} (ref >59)

## 2021-07-06 LAB — CBC WITH DIFFERENTIAL/PLATELET
Basophils Absolute: 0 10*3/uL (ref 0.0–0.2)
Basos: 1 %
EOS (ABSOLUTE): 0.4 10*3/uL (ref 0.0–0.4)
Eos: 12 %
Hematocrit: 38.2 % (ref 34.0–46.6)
Hemoglobin: 12.8 g/dL (ref 11.1–15.9)
Immature Grans (Abs): 0 10*3/uL (ref 0.0–0.1)
Immature Granulocytes: 0 %
Lymphocytes Absolute: 0.8 10*3/uL (ref 0.7–3.1)
Lymphs: 25 %
MCH: 28.6 pg (ref 26.6–33.0)
MCHC: 33.5 g/dL (ref 31.5–35.7)
MCV: 85 fL (ref 79–97)
Monocytes Absolute: 0.5 10*3/uL (ref 0.1–0.9)
Monocytes: 16 %
Neutrophils Absolute: 1.5 10*3/uL (ref 1.4–7.0)
Neutrophils: 46 %
Platelets: 202 10*3/uL (ref 150–450)
RBC: 4.48 x10E6/uL (ref 3.77–5.28)
RDW: 13.1 % (ref 11.7–15.4)
WBC: 3.2 10*3/uL — ABNORMAL LOW (ref 3.4–10.8)

## 2021-07-06 LAB — HEMOGLOBIN A1C
Est. average glucose Bld gHb Est-mCnc: 169 mg/dL
Hgb A1c MFr Bld: 7.5 % — ABNORMAL HIGH (ref 4.8–5.6)

## 2021-07-06 LAB — LIPID PANEL
Chol/HDL Ratio: 3.3 ratio (ref 0.0–4.4)
Cholesterol, Total: 136 mg/dL (ref 100–199)
HDL: 41 mg/dL (ref >39)
LDL Chol Calc (NIH): 75 mg/dL (ref 0–99)
Triglycerides: 109 mg/dL (ref 0–149)
VLDL Cholesterol Cal: 20 mg/dL (ref 5–40)

## 2021-07-19 ENCOUNTER — Telehealth (HOSPITAL_BASED_OUTPATIENT_CLINIC_OR_DEPARTMENT_OTHER): Payer: Self-pay

## 2021-07-19 IMAGING — PT NM PET TUM IMG INITIAL (PI) SKULL BASE T - THIGH
7 of 8 series · 23 of 25 positions shown · non-contrast
Comparison: None

CLINICAL DATA: Initial treatment strategy for lymphoma.

EXAM:
NUCLEAR MEDICINE PET SKULL BASE TO THIGH
TECHNIQUE: 7.0 mCi F-18 FDG was injected intravenously. Full-ring PET imaging
was performed from the skull base to thigh after the radiotracer. CT
data was obtained and used for attenuation correction and anatomic
localization.
Fasting blood glucose: 149 mg/dl

[Series 3: pet sk_thigh ac · axial · 5.0mm · 4.07mm/px · z∈[-1030,-202]mm · 5 of 208 slices shown]
[im 1/208]
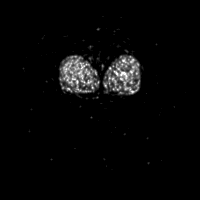
[im 52/208]
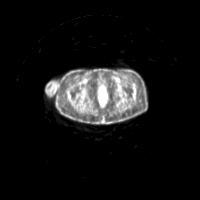
[im 104/208]
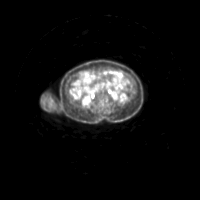
[im 156/208]
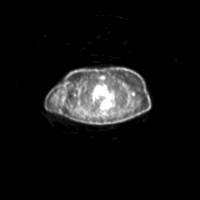
[im 208/208]
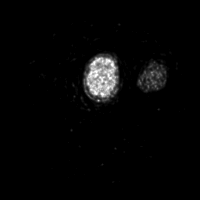

[Series 4: ct sk_thigh 5.0 b31f · axial · 5.0mm · 0.95mm/px · z∈[-1030,-202]mm · 4 of 208 slices shown]
[im 1/208]
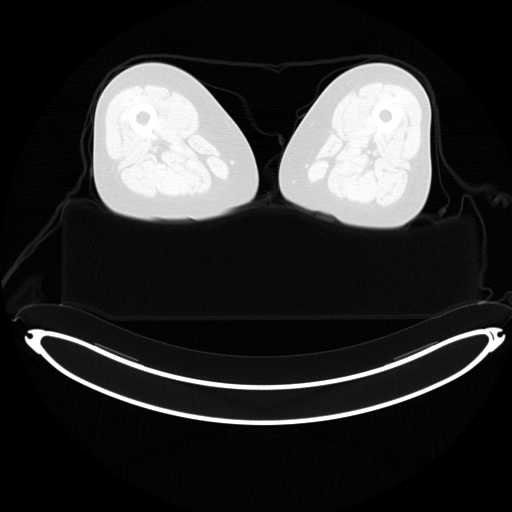
[im 104/208]
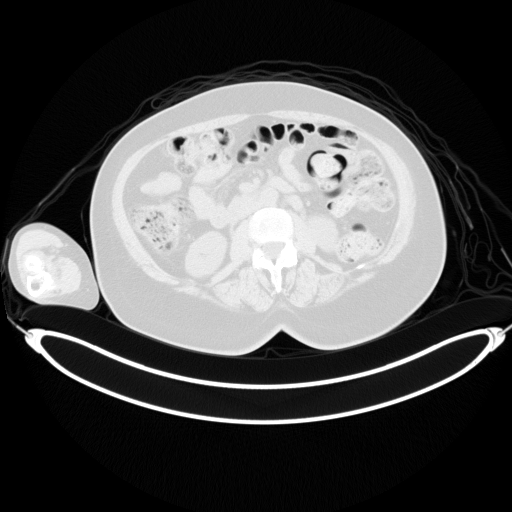
[im 156/208]
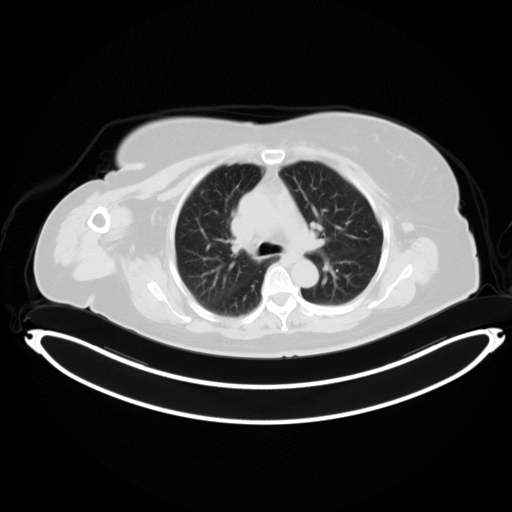
[im 208/208  brain]
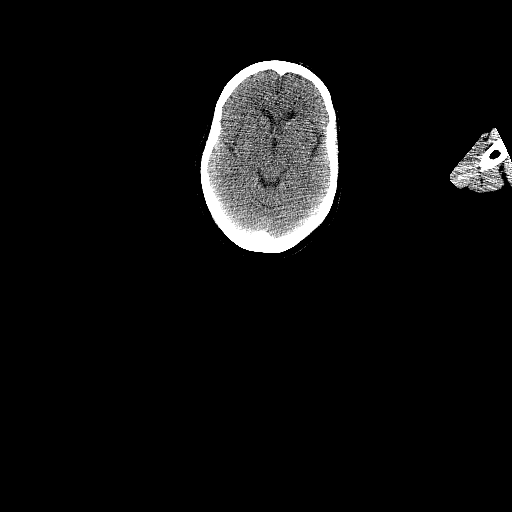

[Series 5: pet sk_thigh nac · axial · 5.0mm · 4.07mm/px · z∈[-1030,-202]mm · 5 of 208 slices shown]
[im 1/208]
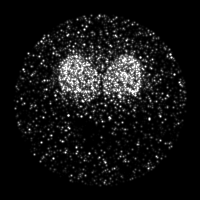
[im 52/208]
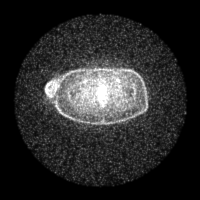
[im 104/208]
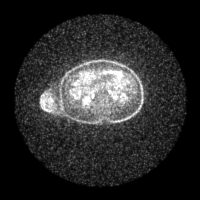
[im 156/208]
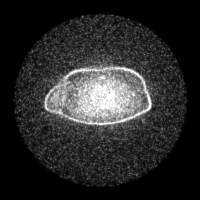
[im 208/208]
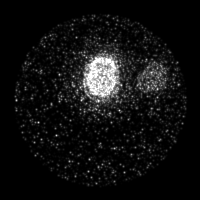

[Series 8: ct sk_thigh 5.0 b70f lung_bone · axial · 5.0mm · 0.49mm/px · 1 of 55 slices shown]
[im 1/55  bone]
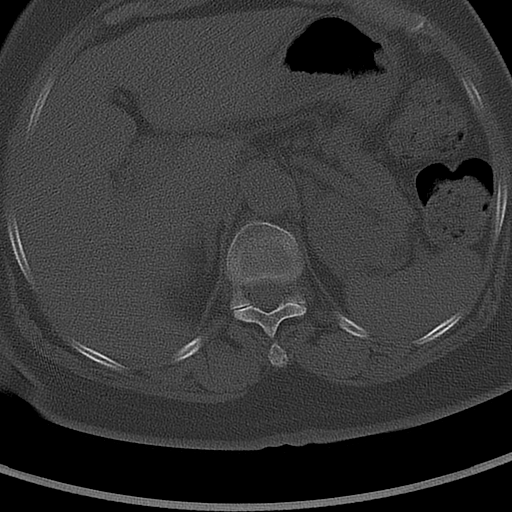

[Series 603: range-ct sk_thigh 5.0 (id)<alpha range> · 2 of 62 slices shown (1 of 2)]
[im 1/62]
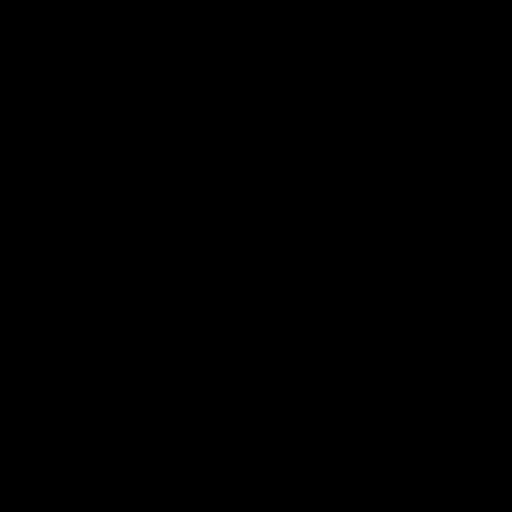
[im 62/62]
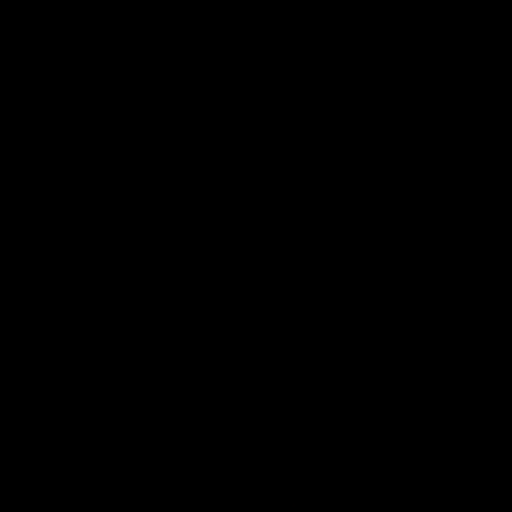

[Series 605: range-ct sk_thigh 5.0 (id)<alpha range> · 5 of 195 slices shown (2 of 2)]
[im 1/195]
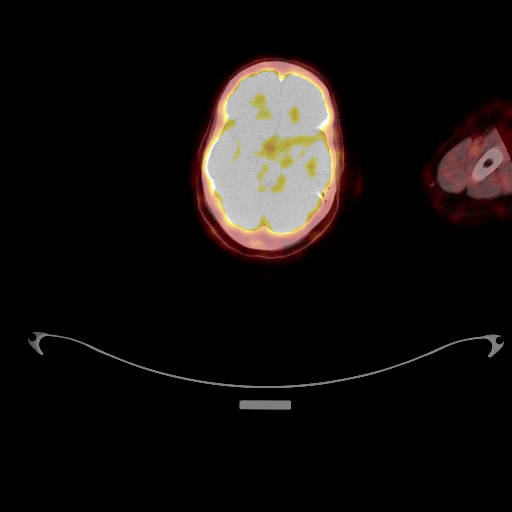
[im 49/195]
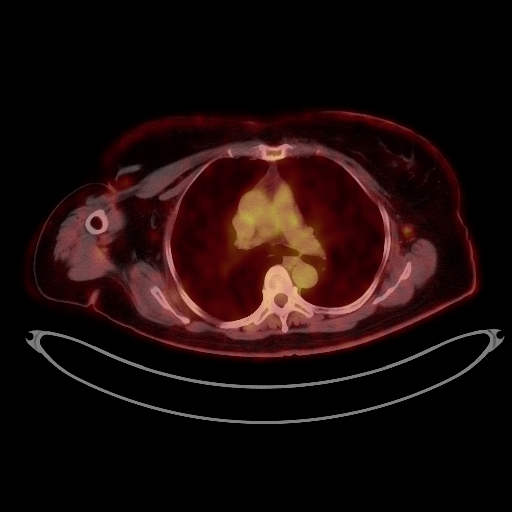
[im 98/195]
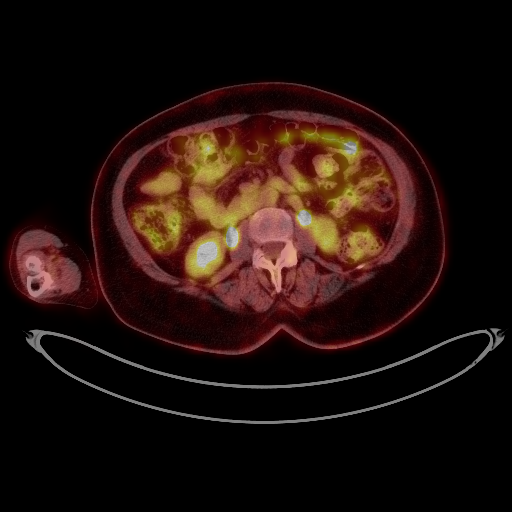
[im 146/195]
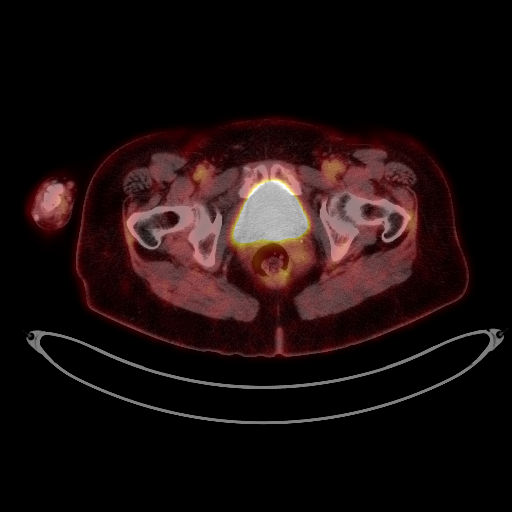
[im 195/195]
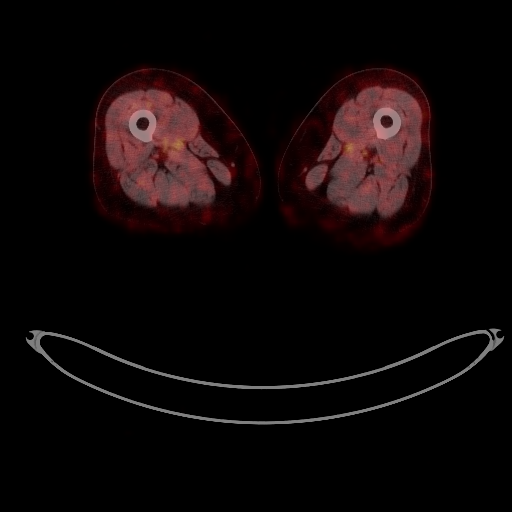

[Series 8049: results mm oncology reading · 0.9mm · 0.49mm/px · 1 of 6 slices shown]
[im 1/6]
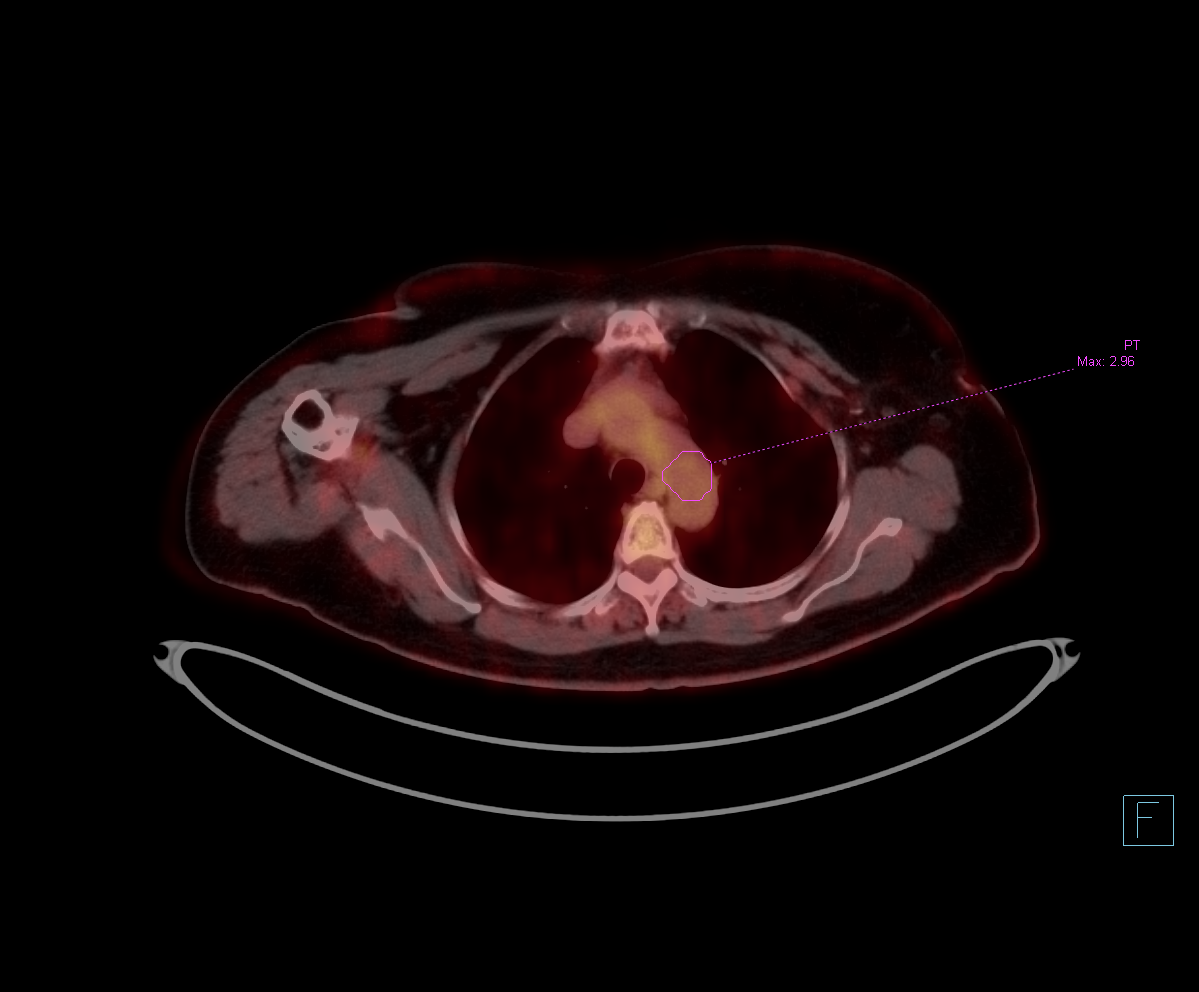

[23 of 25 positions shown; findings below may reference images not displayed]

FINDINGS: Mediastinal blood pool activity: SUV max

Liver activity: SUV max

NECK: No hypermetabolic lymph nodes in the neck.

Incidental CT findings: none

CHEST: Enlarged left retropectoral node measures 3.5 x 1.9 cm within
SUV max of 7.3.

Left axillary lymph node measures 2.3 x 1.6 cm with SUV max of 8.6.
Postsurgical change within the left axilla with surgical clips
noted. No enlarged or FDG avid supraclavicular or right axillary
lymph nodes.

Pre-vascular lymph node anterior to the aortic hour measures 1.2 cm
without significant FDG uptake. Adjacent left pre-vascular node
measures 1.1 cm within SUV max of 3.5. No hypermetabolic hilar lymph
nodes.

No pleural effusion identified. No suspicious hypermetabolic lung
nodules.

Incidental CT findings: Tiny nonspecific nodule in the anterior
right upper lobe measures 3 mm, image [DATE].

ABDOMEN/PELVIS: No abnormal hypermetabolic activity within the
liver, pancreas, adrenal glands, or spleen. No hypermetabolic lymph
nodes in the abdomen or pelvis. The spleen is normal in size
measuring 6.9 cm and has an SUV max of 3.37.

Incidental CT findings: Aortic atherosclerosis.  No aneurysm.

SKELETON: No focal hypermetabolic activity to suggest skeletal
metastasis.

Incidental CT findings: none
IMPRESSION: 1. Enlarged and FDG avid left retropectoral and left axillary lymph
nodes compatible with the history of lymphoma.
2. Borderline enlarged left pre-vascular lymph nodes within the
anterior mediastinum exhibit either no uptake or mild uptake less
than liver.
3. No mass or adenopathy within the abdomen or pelvis. Normal size
spleen.
4.  Aortic Atherosclerosis ([id number]).

## 2021-07-19 NOTE — Telephone Encounter (Signed)
Patient is aware of lab results and medication Metformin Extended release was called in to [facility] [address].

## 2021-08-15 ENCOUNTER — Other Ambulatory Visit: Payer: Self-pay

## 2021-08-15 ENCOUNTER — Inpatient Hospital Stay: Payer: Medicare PPO

## 2021-08-15 ENCOUNTER — Encounter: Payer: Self-pay | Admitting: Internal Medicine

## 2021-08-15 ENCOUNTER — Inpatient Hospital Stay: Payer: Medicare PPO | Attending: Internal Medicine | Admitting: Internal Medicine

## 2021-08-15 VITALS — BP 128/79 | HR 77 | Temp 97.5°F | Resp 18 | Ht 59.0 in | Wt 136.3 lb

## 2021-08-15 DIAGNOSIS — D72819 Decreased white blood cell count, unspecified: Secondary | ICD-10-CM | POA: Diagnosis not present

## 2021-08-15 DIAGNOSIS — Z8571 Personal history of Hodgkin lymphoma: Secondary | ICD-10-CM | POA: Diagnosis not present

## 2021-08-15 DIAGNOSIS — E119 Type 2 diabetes mellitus without complications: Secondary | ICD-10-CM | POA: Diagnosis not present

## 2021-08-15 DIAGNOSIS — C8400 Mycosis fungoides, unspecified site: Secondary | ICD-10-CM | POA: Insufficient documentation

## 2021-08-15 DIAGNOSIS — C8104 Nodular lymphocyte predominant Hodgkin lymphoma, lymph nodes of axilla and upper limb: Secondary | ICD-10-CM

## 2021-08-15 LAB — CMP (CANCER CENTER ONLY)
ALT: 16 U/L (ref 0–44)
AST: 15 U/L (ref 15–41)
Albumin: 4.4 g/dL (ref 3.5–5.0)
Alkaline Phosphatase: 79 U/L (ref 38–126)
Anion gap: 7 (ref 5–15)
BUN: 13 mg/dL (ref 8–23)
CO2: 27 mmol/L (ref 22–32)
Calcium: 9.6 mg/dL (ref 8.9–10.3)
Chloride: 106 mmol/L (ref 98–111)
Creatinine: 0.82 mg/dL (ref 0.44–1.00)
GFR, Estimated: >60 mL/min (ref >60)
Glucose, Bld: 202 mg/dL — ABNORMAL HIGH (ref 70–99)
Potassium: 3.5 mmol/L (ref 3.5–5.1)
Sodium: 140 mmol/L (ref 135–145)
Total Bilirubin: 0.5 mg/dL (ref 0.3–1.2)
Total Protein: 7.2 g/dL (ref 6.5–8.1)

## 2021-08-15 LAB — CBC WITH DIFFERENTIAL (CANCER CENTER ONLY)
Abs Immature Granulocytes: 0.01 10*3/uL (ref 0.00–0.07)
Basophils Absolute: 0 10*3/uL (ref 0.0–0.1)
Basophils Relative: 1 %
Eosinophils Absolute: 0.7 10*3/uL — ABNORMAL HIGH (ref 0.0–0.5)
Eosinophils Relative: 18 %
HCT: 36.2 % (ref 36.0–46.0)
Hemoglobin: 11.9 g/dL — ABNORMAL LOW (ref 12.0–15.0)
Immature Granulocytes: 0 %
Lymphocytes Relative: 22 %
Lymphs Abs: 0.8 10*3/uL (ref 0.7–4.0)
MCH: 29.2 pg (ref 26.0–34.0)
MCHC: 32.9 g/dL (ref 30.0–36.0)
MCV: 88.7 fL (ref 80.0–100.0)
Monocytes Absolute: 0.5 10*3/uL (ref 0.1–1.0)
Monocytes Relative: 12 %
Neutro Abs: 1.8 10*3/uL (ref 1.7–7.7)
Neutrophils Relative %: 47 %
Platelet Count: 177 10*3/uL (ref 150–400)
RBC: 4.08 MIL/uL (ref 3.87–5.11)
RDW: 13.6 % (ref 11.5–15.5)
WBC Count: 3.8 10*3/uL — ABNORMAL LOW (ref 4.0–10.5)
nRBC: 0 % (ref 0.0–0.2)

## 2021-08-15 LAB — LACTATE DEHYDROGENASE: LDH: 164 U/L (ref 98–192)

## 2021-08-15 NOTE — Progress Notes (Signed)
[facility] [phone]   [phone]  OFFICE PROGRESS NOTE  Early, Coralee Pesa, NP [address] [place] [address]  DIAGNOSIS:  1) Stage IA Hodgkin lymphoma, lymphocyte predominant subtype involving the left retropectoral and left axillary lymph nodes as well as borderline enlarged left prevascular lymph nodes within the anterior mediastinum diagnosed in September 2020. 2) mycosis fungoides currently under the care of Dr. Jeannine Kitten at [facility].  PRIOR THERAPY: Weekly Rituxan 375 mg/M2 for 4 weeks.  First dose expected April 29, 2019.  Status post 4 cycles.  Last dose was given May 20, 2019.  CURRENT THERAPY: Methotrexate for the mycosis fungoides under the care of Dr. Jeannine Kitten..  INTERVAL HISTORY: Katherine Barnett 81 y.o. female returns to the clinic today for follow-up visit.  The patient is feeling fine today with no concerning complaints.  She was seen recently by Dr. Jeannine Kitten at [facility].  She was on treatment with methotrexate which was discontinued recently.  She is currently on treatment with steroid creams and followed by dermatology as well as Dr. Jeannine Kitten.  She is also being considered for UV treatment.  The patient denied having any current chest pain, shortness of breath, cough or hemoptysis.  She denied having any palpable lymphadenopathy.  She has no nausea, vomiting, diarrhea or constipation.  She denied having any headache or visual changes.  She is here today for evaluation and repeat blood work.     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  MEDICATIONS:  Current Outpatient Medications  Medication Sig Dispense Refill   aspirin 81 MG tablet Take 81 mg by mouth daily.     calcium-vitamin D (OSCAL WITH D) 500-200 MG-UNIT per tablet Take 1 tablet by mouth daily with breakfast.     folic acid (FOLVITE) 1 MG tablet Take one po on days not taking methotrexate     hydrochlorothiazide (HYDRODIURIL) 25 MG tablet Take 25 mg by mouth daily.     hydrocortisone 2.5 % cream Apply 1-2 x per day on the week off of Triamcinolone     losartan (COZAAR) 25 MG tablet Take 25 mg by mouth daily.     methotrexate (RHEUMATREX) 2.5 MG tablet Take 3 tabs once po, check labs next week.  If ok, then 10 tabs po, check labs next week.  If ok, 10 tabs po each week     simvastatin (ZOCOR) 20 MG tablet Take 20 mg by mouth at bedtime.     triamcinolone cream (KENALOG) 0.1 % Apply once a day to lesions daily for 2 weeks, one week off and repeat cycle     valACYclovir (VALTREX) 1000 MG tablet Take 500 mg by mouth 2 (two) times daily as needed (fever blisters).      No current facility-administered medications for this visit.    SURGICAL HISTORY:  Past Surgical History:  Procedure Laterality Date   AXILLARY LYMPH NODE BIOPSY Left 03/12/2019   Procedure: EXCISIONAL BIOPSY LEFT AXILLARY LYMPH NODES;  Surgeon: Coralie Keens,  MD;  Location: [facility];  Service: General;  Laterality: Left;   BREAST EXCISIONAL BIOPSY Left    Lt axillary bx, was benign   COLONOSCOPY  10/11/2016   repeat in 10 yrs   EYE SURGERY Bilateral    cataract surgery with lens implant   ORIF HUMERUS FRACTURE Right 12/22/2014   Procedure: OPEN REDUCTION INTERNAL FIXATION (ORIF) RIGHT DISTAL HUMERUS FRACTURE;  Surgeon: Mcarthur Rossetti, MD;  Location: [facility];  Service: Orthopedics;  Laterality: Right;    REVIEW OF SYSTEMS:  A comprehensive review of systems was negative.   PHYSICAL EXAMINATION: General appearance: alert, cooperative, and no distress Head:  Normocephalic, without obvious abnormality, atraumatic Neck: no adenopathy, no JVD, supple, symmetrical, trachea midline, and thyroid not enlarged, symmetric, no tenderness/mass/nodules Lymph nodes: Cervical, supraclavicular, and axillary nodes normal. Resp: clear to auscultation bilaterally Back: symmetric, no curvature. ROM normal. No CVA tenderness. Cardio: regular rate and rhythm, S1, S2 normal, no murmur, click, rub or gallop GI: soft, non-tender; bowel sounds normal; no masses,  no organomegaly Extremities: extremities normal, atraumatic, no cyanosis or edema  ECOG PERFORMANCE STATUS: 0 - Asymptomatic  Blood pressure 128/79, pulse 77, temperature (!) 97.5 F (36.4 C), temperature source Tympanic, resp. rate 18, height 4\' 11"  (1.499 m), weight 136 lb 4.8 oz (61.8 kg), SpO2 98 %.  LABORATORY DATA: Lab Results  Component Value Date   WBC 3.8 (L) 08/15/2021   HGB 11.9 (L) 08/15/2021   HCT 36.2 08/15/2021   MCV 88.7 08/15/2021   PLT 177 08/15/2021      Chemistry      Component Value Date/Time   NA 140 08/15/2021 1443   NA 143 07/05/2021 1137   K 3.5 08/15/2021 1443   CL 106 08/15/2021 1443   CO2 27 08/15/2021 1443   BUN 13 08/15/2021 1443   BUN 13 07/05/2021 1137   CREATININE 0.82 08/15/2021 1443      Component Value Date/Time   CALCIUM 9.6 08/15/2021 1443   ALKPHOS 79 08/15/2021 1443   AST 15 08/15/2021 1443   ALT 16 08/15/2021 1443   BILITOT 0.5 08/15/2021 1443       RADIOGRAPHIC STUDIES: No results found.  ASSESSMENT AND PLAN: This is a very pleasant 81 years old African-American female recently diagnosed with a stage IA non-bulky Hodgkin lymphoma, lymphocyte predominant subtype diagnosed in September 2020 and presented with enlarged and hypermetabolic left retropectoral and left axillary lymph nodes. She underwent treatment with weekly Rituxan status post 4 cycles.  She tolerated her treatment well with no concerning adverse effects. The patient was also  recently diagnosed with mycosis fungoides and treated at [facility] under the care of Dr. Jeannine Kitten. She was treated with methotrexate that was discontinued recently.  The patient is being considered for treatment with UVB. She is here today for evaluation and repeat blood work. Her CBC showed mild leukocytopenia consistent with her previous therapy with methotrexate that was discontinued last week. She has no other concerning issues. I recommended for the patient to continue her follow-up visit and evaluation by Dr. Jeannine Kitten at this point.  I will see the patient on as-needed basis at this point. She was advised to call immediately if she has any other concerning issues.  The patient voices understanding of current disease status and treatment options and is in agreement with the current care plan.  All questions were answered. The patient knows to call the clinic with any problems, questions or concerns. We can certainly see the  patient much sooner if necessary.  Disclaimer: This note was dictated with voice recognition software. Similar sounding words can inadvertently be transcribed and may not be corrected upon review.

## 2021-10-31 ENCOUNTER — Other Ambulatory Visit (HOSPITAL_BASED_OUTPATIENT_CLINIC_OR_DEPARTMENT_OTHER): Payer: Self-pay

## 2021-10-31 DIAGNOSIS — I152 Hypertension secondary to endocrine disorders: Secondary | ICD-10-CM

## 2021-10-31 MED ORDER — LOSARTAN POTASSIUM 25 MG PO TABS: 25.0000 mg | ORAL_TABLET | Freq: Every day | ORAL | 3 refills | Status: DC

## 2021-11-22 ENCOUNTER — Ambulatory Visit (INDEPENDENT_AMBULATORY_CARE_PROVIDER_SITE_OTHER): Payer: Medicare PPO | Admitting: Nurse Practitioner

## 2021-11-22 ENCOUNTER — Encounter (HOSPITAL_BASED_OUTPATIENT_CLINIC_OR_DEPARTMENT_OTHER): Payer: Self-pay | Admitting: Nurse Practitioner

## 2021-11-22 VITALS — BP 117/72 | HR 95 | Resp 12 | Ht 59.0 in | Wt 131.9 lb

## 2021-11-22 DIAGNOSIS — E1159 Type 2 diabetes mellitus with other circulatory complications: Secondary | ICD-10-CM | POA: Diagnosis not present

## 2021-11-22 DIAGNOSIS — Z0000 Encounter for general adult medical examination without abnormal findings: Secondary | ICD-10-CM

## 2021-11-22 DIAGNOSIS — E1165 Type 2 diabetes mellitus with hyperglycemia: Secondary | ICD-10-CM

## 2021-11-22 DIAGNOSIS — I152 Hypertension secondary to endocrine disorders: Secondary | ICD-10-CM | POA: Diagnosis not present

## 2021-11-22 NOTE — Patient Instructions (Addendum)
?MEDICARE ANNUAL WELLNESS VISIT ?Health Maintenance Summary and Written Plan of Care ? ?[name] , ? ?Thank you for allowing me to perform your Medicare Annual Wellness Visit and for your ongoing commitment to your health.  ? ?Health Maintenance & Immunization History ?Health Maintenance  ?Topic Date Due  ?? Pneumonia Vaccine 24+ Years old (1 - PCV) Never done  ?? FOOT EXAM  Never done  ?? OPHTHALMOLOGY EXAM  Never done  ?? Zoster Vaccines- Shingrix (1 of 2) Never done  ?? COVID-19 Vaccine (4 - Booster for Pfizer series) 07/04/2021  ?? INFLUENZA VACCINE  02/21/2022  ?? HEMOGLOBIN A1C  05/25/2022  ?? TETANUS/TDAP  12/16/2024  ?? DEXA SCAN  Completed  ?? Hepatitis C Screening  Completed  ?? HPV VACCINES  Aged Out  ? ?Immunization History  ?Administered Date(s) Administered  ?? PFIZER(Purple Top)SARS-COV-2 Vaccination 11/07/2019, 12/02/2019  ?? Pension scheme manager 56yr & up 05/09/2021  ?? Tdap 12/17/2014  ? ? ?These are the patient goals that we discussed: ? Goals Addressed   ?  ?  ?  ?  ? This Visit's Progress  ? ? DIET     ?  Learn what foods are healthy options for diabetes control and overall health.  ?  ?  ?  ? ?This is a list of Health Maintenance Items that are overdue or due now: ?Health Maintenance Due  ?Topic Date Due  ?? Pneumonia Vaccine 81 Years old (1 - PCV) Never done  ?? FOOT EXAM  Never done  ?? OPHTHALMOLOGY EXAM  Never done  ?? Zoster Vaccines- Shingrix (1 of 2) Never done  ?? COVID-19 Vaccine (4 - Booster for PDecaturseries) 07/04/2021  ?  ? ?Orders/Referrals Placed Today: ?Orders Placed This Encounter  ?Procedures  ?? Hemoglobin A1c  ?  Order Specific Question:   Release to patient  ?  Answer:   Immediate  ? ?(Contact our referral department at [phone] you have not spoken with someone about your referral appointment within the next 5 days)  ? ? ?Follow-up Plan ?6 month diabetes ? ? ?SWorthy Keeler DNP, AGNP-c ? ? ?Screening for Type 2 Diabetes ? ?A screening test  for type 2 diabetes (type 2 diabetes mellitus) is a blood test to measure your blood sugar (glucose) level. This test is done to check for Katherine Barnett signs of diabetes, before you develop symptoms.  ?Type 2 diabetes is a long-term (chronic) disease. In type 2 diabetes, one or both of these problems may be present: ?The pancreas does not make enough of a hormone called insulin. ?Cells in the body do not respond properly to insulin that the body makes (insulin resistance). ?Normally, insulin allows blood sugar (glucose) to enter cells in the body. The cells use glucose for energy. Insulin resistance or lack of insulin causes excess glucose to build up in the blood instead of going into cells. This results in high blood glucose levels (hyperglycemia), which can cause many complications. ?You may be screened for type 2 diabetes as part of your regular health care, especially if you have a high risk for diabetes. Screening can help to identify type 2 diabetes at its Trashaun Streight stage (prediabetes). Identifying and treating prediabetes may delay or prevent the development of type 2 diabetes. ?Tell a health care provider about: ?All medicines you are taking, including vitamins, herbs, eye drops, creams, and over-the-counter medicines. ?Any bleeding problems you have. ?Any medical conditions you have. ?Whether you are pregnant or may be pregnant. ?Who should  be screened for type 2 diabetes? ?Adults ?Adults age 38 and older. These adults should be screened once every three years. ?Adults who are any age, are overweight, and have one other risk factor. These adults should be screened once every three years. ?Adults who have normal blood glucose levels and two or more risk factors. These adults may be screened once every year (annually). ?Women who have had gestational diabetes in the past. These women should be screened once every three years. ?Pregnant women who have risk factors. These women should be screened at their first prenatal  visit and again between weeks 24 and 28 of pregnancy. ?Children and adolescents ?Children and adolescents should be screened for type 2 diabetes if they are overweight and have any of the following risk factors: ?A family history of type 2 diabetes. ?Being a member of a high-risk ethnic group. ?Signs of insulin resistance or conditions that are associated with insulin resistance. ?A mother who had gestational diabetes while pregnant. ?Screening should be done at least once every three years, starting at age 82 or at the onset of puberty, whichever comes first. ?Your health care provider or your child's health care provider may recommend having a screening more or less often. ?What are the risk factors for type 2 diabetes? ?The following are factors that may make you more likely to develop type 2 diabetes and can be modified: ?Not getting enough exercise. ?Having high blood pressure. ?Having low levels of good cholesterol (HDL-C) or high levels of blood fats (triglycerides). ?Having high blood glucose in a previous blood test. ?Being overweight or obese. ?The following are factors that may make you more likely to develop type 2 diabetes and can not be modified: ?Having a parent or sibling (first-degree relative) who has diabetes. ?Being of American-Indian, African-American, Hispanic/Latino, Asian, or [place] descent. ?Being older than age 61. ?Having a history of diabetes during pregnancy (gestational diabetes). ?Having certain diseases or conditions that may be caused by insulin resistance, including: ?Acanthosis nigricans. This is a condition that causes dark skin on the neck, armpits, and groin. ?Polycystic ovary syndrome (PCOS). ?Cardiovascular heart disease. ?What happens during screening? ?During screening, your health care provider may ask questions about: ?Your health and your risk factors, including your activity level and any medical conditions that you have. ?The health of your first-degree  relatives. ?Past pregnancies, if this applies. ?Your health care provider will also do a physical exam, including a blood pressure measurement and blood tests. There are four blood tests that can be used to screen for type 2 diabetes. You may have one or more of the following: ?A fasting blood glucose (FBG) test. You will not be allowed to eat (you will fast) for 8 hours or more before a blood sample is taken. ?A random blood glucose test. This test checks your blood glucose at any time of the day regardless of when you ate. ?An oral glucose tolerance test (OGTT). This test measures your blood glucose at two times: ?After you have not eaten (have fasted) overnight. This is your baseline glucose level. ?Two hours after you drink a glucose-containing beverage. ?An A1C (hemoglobin A1C) blood test. This test provides information about blood glucose control over the previous 2-3 months. ?What do the results mean? ?Your test results are a measurement of how much glucose is in your blood. Normal blood glucose levels mean that you do not have diabetes or prediabetes. High blood glucose levels may mean that you have prediabetes or diabetes. Depending on  the results, other tests may be needed to confirm the diagnosis. ?You may be diagnosed with type 2 diabetes if: ?Your FBG level is 126 mg/dL (7.0 mmol/L) or higher. ?Your random blood glucose level is 200 mg/dL (11.1 mmol/L) or higher. ?Your A1C level is 6.5% or higher. ?Your OGTT result is higher than 200 mg/dL (11.1 mmol/L). ?These blood tests may be repeated to confirm your diagnosis. Talk with your health care provider about what your results mean. ?Summary ?A screening test for type 2 diabetes (type 2 diabetes mellitus) is a blood test to measure your blood sugar (glucose) level. ?Know what your risk factors are for developing type 2 diabetes. ?If you are at risk, get screening tests as often as told by your health care provider. ?Screening may help you identify type 2  diabetes at its Hassen Bruun stage (prediabetes). Identifying and treating prediabetes may delay or prevent the development of type 2 diabetes. ?This information is not intended to replace advice given to you by yo

## 2021-11-23 LAB — HEMOGLOBIN A1C
Est. average glucose Bld gHb Est-mCnc: 160 mg/dL
Hgb A1c MFr Bld: 7.2 % — ABNORMAL HIGH (ref 4.8–5.6)

## 2021-11-24 ENCOUNTER — Encounter (HOSPITAL_BASED_OUTPATIENT_CLINIC_OR_DEPARTMENT_OTHER): Payer: Self-pay | Admitting: Nurse Practitioner

## 2021-12-01 NOTE — Progress Notes (Signed)
? ?Subjective:  ? Katherine Barnett is a 81 y.o. female who presents for Medicare Annual (Subsequent) preventive examination. ? ?Review of Systems    ?Negative ROS today ?Cardiac Risk Factors include: advanced age (>67mn, >>58women) ? ?   ?Objective:  ?  ?Today's Vitals  ? 11/22/21 1434  ?BP: 117/72  ?Pulse: 95  ?Resp: 12  ?SpO2: 99%  ?Weight: 131 lb 14.4 oz (59.8 kg)  ?Height: '4\' 11"'$  (1.499 m)  ? ?Body mass index is 26.64 kg/m?. ? ? ?  11/22/2021  ?  3:12 PM 08/15/2021  ?  3:06 PM 02/09/2021  ? 10:54 AM 04/15/2019  ?  7:40 AM 04/08/2019  ?  2:26 PM 03/05/2019  ? 10:02 AM 07/13/2017  ? 10:08 AM  ?Advanced Directives  ?Does Patient Have a Medical Advance Directive? Yes Yes Yes  Yes Yes Yes  ?Type of AParamed[place]of [place]Living will Healthcare Power of [place]aug[place]Living will [place]Living will HPawnee RockLiving will   ?Does patient want to make changes to medical advance directive? No - Patient declined  No - Patient declined   No - Patient declined Yes (MAU/Ambulatory/Procedural Areas - Information given)  ?Copy of HR[place]in Chart? No - copy requested No - copy requested No - copy requested No - copy requested No - copy requested No - copy requested   ? ? ?Current Medications (verified) ?Outpatient Encounter Medications as of 11/22/2021  ?Medication Sig  ? aspirin 81 MG tablet Take 81 mg by mouth daily.  ? calcium-vitamin D (OSCAL WITH D) 500-200 MG-UNIT per tablet Take 1 tablet by mouth daily with breakfast.  ? hydrocortisone 2.5 % cream Apply 1-2 x per day on the week off of Triamcinolone  ? losartan (COZAAR) 25 MG tablet Take 1 tablet (25 mg total) by mouth daily.  ? simvastatin (ZOCOR) 20 MG tablet Take 20 mg by mouth at bedtime.  ? valACYclovir (VALTREX) 1000 MG tablet Take 500 mg by mouth 2 (two) times daily as needed (fever blisters).  ? ?No facility-administered  encounter medications on file as of 11/22/2021.  ? ? ?Allergies (verified) ?Patient has no known allergies.  ? ?History: ?Past Medical History:  ?Diagnosis Date  ? Complication of anesthesia   ? vomited after colonoscopy  ? Diabetes mellitus without complication ([place]   ? type 2 - diet controlled  ? Hodgkin's lymphoma ([place]   ? Hypertension   ? Melanoma ([place]   ? Multinodular goiter (nontoxic)   ? 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 Osteoporosis   ? Pseudophakia of left eye   ? ?Past Surgical History:  ?Procedure Laterality Date  ? AXILLARY LYMPH NODE BIOPSY Left 03/12/2019  ? Procedure: EXCISIONAL BIOPSY LEFT AXILLARY LYMPH NODES;  Surgeon: BCoralie Keens MD;  [facility]  Service: General;  Laterality: Left;  ? BREAST EXCISIONAL BIOPSY Left   ? Lt axillary bx, was benign  ? COLONOSCOPY  10/11/2016  ? repeat in 10 yrs  ? EYE SURGERY Bilateral   ? cataract surgery with lens implant  ? ORIF HUMERUS FRACTURE Right 12/22/2014  ? Procedure: OPEN REDUCTION INTERNAL FIXATION (ORIF) RIGHT DISTAL HUMERUS FRACTURE;  Surgeon: CMcarthur Rossetti MD;  [facility]  Service: Orthopedics;  Laterality: Right;  ? ?History reviewed. No pertinent family history. ?Social History  ? ?Socioeconomic History  ? Marital status: Widowed  ?  Spouse name: Not on file  ? Number  of children: Not on file  ? Years of education: Not on file  ? Highest education level: Not on file  ?Occupational History  ? Not on file  ?Tobacco Use  ? Smoking status: Former  ?  Types: Cigarettes  ?  Quit date: 12/17/1984  ?  Years since quitting: 36.9  ?  Passive exposure: Never  ? Smokeless tobacco: Never  ?Vaping Use  ? Vaping Use: Never used  ?Substance and Sexual Activity  ? Alcohol use: Never  ? Drug use: Never  ? Sexual activity: Not Currently  ?Other Topics Concern  ? Not on file  ?Social History Narrative  ? Not on file  ? ?Social Determinants of Health  ? ?Financial Resource Strain: Low Risk   ? Difficulty of Paying Living Expenses: Not hard at all   ?Food Insecurity: No Food Insecurity  ? Worried About Charity fundraiser in the Last Year: Never true  ? Ran Out of Food in the Last Year: Never true  ?Transportation Needs: No Transportation Needs  ? Lack of Transportation (Medical): No  ? Lack of Transportation (Non-Medical): No  ?Physical Activity: Sufficiently Active  ? Days of Exercise per Week: 7 days  ? Minutes of Exercise per Session: 60 min  ?Stress: No Stress Concern Present  ? Feeling of Stress : Not at all  ?Social Connections: Moderately Integrated  ? Frequency of Communication with Friends and Family: More than three times a week  ? Frequency of Social Gatherings with Friends and Family: More than three times a week  ? Attends Religious Services: More than 4 times per year  ? Active Member of Clubs or Organizations: Yes  ? Attends Archivist Meetings: More than 4 times per year  ? Marital Status: Widowed  ? ? ?Tobacco Counseling ?Counseling given: No ? ? ?Clinical Intake: ? ?Pre-visit preparation completed: Yes ? ?Pain : No/denies pain ? ?  ? ?BMI - recorded: 26.64 ?Nutritional Status: BMI 25 -29 Overweight ?Nutritional Risks: Other (Comment), None ?Diabetes: No ? ?How often do you need to have someone help you when you read instructions, pamphlets, or other written materials from your doctor or pharmacy?: 1 - Never ? ?Diabetic?Yes  ? ? ?Activities of Daily Living ? ?  11/22/2021  ?  3:13 PM  ?In your present state of health, do you have any difficulty performing the following activities:  ?Hearing? 0  ?Vision? 0  ?Difficulty concentrating or making decisions? 0  ?Walking or climbing stairs? 0  ?Dressing or bathing? 0  ?Doing errands, shopping? 0  ?Preparing Food and eating ? N  ?Using the Toilet? N  ?In the past six months, have you accidently leaked urine? N  ?Managing your Medications? N  ?Managing your Finances? N  ?Housekeeping or managing your Housekeeping? N  ? ? ?Patient Care Team: ?Dimitrios Balestrieri, Coralee Pesa, NP as PCP - General (Nurse  Practitioner) ?Renaldo Harrison, MD as Referring Physician (Dermatology) ?Curt Bears, MD as Consulting Physician (Oncology) ? ?Indicate any recent Medical Services you may have received from other than [facility] providers in the past year (date may be approximate). ? ?   ?Assessment:  ? This is a routine wellness examination for Anderson. ? ?Hearing/Vision screen ?No results found. ? ?Dietary issues and exercise activities discussed: ?Current Exercise Habits: Structured exercise class, Type of exercise: walking;strength training/weights;calisthenics, Time (Minutes): 60, Frequency (Times/Week): 7, Weekly Exercise (Minutes/Week): 420, Intensity: Moderate, Exercise limited by: None identified ? ? Goals Addressed   ? ?  ?  ?  ?  ?  This Visit's Progress  ?  DIET     ?  Learn what foods are healthy options for diabetes control and overall health.  ?  ? ?  ? ?Depression Screen ? ?  11/22/2021  ?  2:33 PM 04/12/2021  ?  5:27 PM 07/13/2017  ? 10:14 AM  ?PHQ 2/9 Scores  ?PHQ - 2 Score 0 0 0  ?  ?Fall Risk ? ?  11/22/2021  ?  2:32 PM 07/05/2021  ? 12:24 PM 04/12/2021  ?  5:27 PM 07/13/2017  ? 10:14 AM  ?Fall Risk   ?Falls in the past year? 0 0 0 No  ?Number falls in past yr: 0 0 0   ?Injury with Fall? 0 0 0   ?Risk for fall due to : No Fall Risks No Fall Risks No Fall Risks   ?Follow up Falls evaluation completed;Education provided Falls evaluation completed;Education provided Falls evaluation completed;Education provided;Falls prevention discussed   ? ? ?FALL RISK PREVENTION PERTAINING TO THE HOME: ? ?Any stairs in or around the home? No  ?If so, are there any without handrails?  NA ?Home free of loose throw rugs in walkways, pet beds, electrical cords, etc? Yes  ?Adequate lighting in your home to reduce risk of falls? Yes  ? ?ASSISTIVE DEVICES UTILIZED TO PREVENT FALLS: ? ?Life alert? No  ?Use of a cane, walker or w/c? No  ?Grab bars in the bathroom? Yes  ?Shower chair or bench in shower? Yes  ?Elevated toilet seat or a  handicapped toilet? Yes  ? ?TIMED UP AND GO: ? ?Was the test performed? No .  ? ?Gait slow and steady without use of assistive device ? ?Cognitive Function: ?  ?  ? ?  11/22/2021  ?  3:14 PM  ?6CIT Screen  ?What Year? 0 poin

## 2021-12-01 NOTE — Assessment & Plan Note (Signed)
Information provided on health maintenance and safety. Patient declines vaccines today.  ?

## 2021-12-01 NOTE — Assessment & Plan Note (Signed)
Chronic. Well controlled at this time. Will monitor.  ?

## 2021-12-01 NOTE — Assessment & Plan Note (Signed)
Diet controlled at this time. Will obtain A1c today. No alarm sx present.  ?

## 2021-12-09 ENCOUNTER — Other Ambulatory Visit: Payer: Self-pay | Admitting: Nurse Practitioner

## 2021-12-09 DIAGNOSIS — Z1231 Encounter for screening mammogram for malignant neoplasm of breast: Secondary | ICD-10-CM

## 2022-01-13 ENCOUNTER — Ambulatory Visit: Payer: Medicare PPO

## 2022-01-13 ENCOUNTER — Other Ambulatory Visit: Payer: Self-pay | Admitting: Physician Assistant

## 2022-01-23 ENCOUNTER — Ambulatory Visit
Admission: RE | Admit: 2022-01-23 | Discharge: 2022-01-23 | Disposition: A | Discharge status: Home / Self Care | Payer: Medicare PPO | Source: Ambulatory Visit | Attending: Nurse Practitioner | Admitting: Nurse Practitioner

## 2022-01-23 DIAGNOSIS — Z1231 Encounter for screening mammogram for malignant neoplasm of breast: Secondary | ICD-10-CM | POA: Diagnosis not present

## 2022-01-23 DIAGNOSIS — C8400 Mycosis fungoides, unspecified site: Secondary | ICD-10-CM | POA: Diagnosis not present

## 2022-01-25 DIAGNOSIS — C8400 Mycosis fungoides, unspecified site: Secondary | ICD-10-CM | POA: Diagnosis not present

## 2022-01-28 ENCOUNTER — Other Ambulatory Visit (HOSPITAL_BASED_OUTPATIENT_CLINIC_OR_DEPARTMENT_OTHER): Payer: Self-pay | Admitting: Nurse Practitioner

## 2022-01-28 DIAGNOSIS — I152 Hypertension secondary to endocrine disorders: Secondary | ICD-10-CM

## 2022-01-30 DIAGNOSIS — C8400 Mycosis fungoides, unspecified site: Secondary | ICD-10-CM | POA: Diagnosis not present

## 2022-02-01 DIAGNOSIS — C8400 Mycosis fungoides, unspecified site: Secondary | ICD-10-CM | POA: Diagnosis not present

## 2022-02-02 DIAGNOSIS — C8400 Mycosis fungoides, unspecified site: Secondary | ICD-10-CM | POA: Diagnosis not present

## 2022-02-02 DIAGNOSIS — Z79899 Other long term (current) drug therapy: Secondary | ICD-10-CM | POA: Diagnosis not present

## 2022-02-06 DIAGNOSIS — C8400 Mycosis fungoides, unspecified site: Secondary | ICD-10-CM | POA: Diagnosis not present

## 2022-02-13 DIAGNOSIS — C8400 Mycosis fungoides, unspecified site: Secondary | ICD-10-CM | POA: Diagnosis not present

## 2022-02-15 DIAGNOSIS — C8400 Mycosis fungoides, unspecified site: Secondary | ICD-10-CM | POA: Diagnosis not present

## 2022-02-20 DIAGNOSIS — C8400 Mycosis fungoides, unspecified site: Secondary | ICD-10-CM | POA: Diagnosis not present

## 2022-02-22 DIAGNOSIS — C8400 Mycosis fungoides, unspecified site: Secondary | ICD-10-CM | POA: Diagnosis not present

## 2022-02-27 DIAGNOSIS — C8400 Mycosis fungoides, unspecified site: Secondary | ICD-10-CM | POA: Diagnosis not present

## 2022-03-01 DIAGNOSIS — C8400 Mycosis fungoides, unspecified site: Secondary | ICD-10-CM | POA: Diagnosis not present

## 2022-03-02 DIAGNOSIS — Z79899 Other long term (current) drug therapy: Secondary | ICD-10-CM | POA: Diagnosis not present

## 2022-03-02 DIAGNOSIS — C8400 Mycosis fungoides, unspecified site: Secondary | ICD-10-CM | POA: Diagnosis not present

## 2022-03-06 DIAGNOSIS — C8400 Mycosis fungoides, unspecified site: Secondary | ICD-10-CM | POA: Diagnosis not present

## 2022-03-08 DIAGNOSIS — C8400 Mycosis fungoides, unspecified site: Secondary | ICD-10-CM | POA: Diagnosis not present

## 2022-03-13 DIAGNOSIS — C8400 Mycosis fungoides, unspecified site: Secondary | ICD-10-CM | POA: Diagnosis not present

## 2022-03-15 DIAGNOSIS — C8400 Mycosis fungoides, unspecified site: Secondary | ICD-10-CM | POA: Diagnosis not present

## 2022-03-20 DIAGNOSIS — C8400 Mycosis fungoides, unspecified site: Secondary | ICD-10-CM | POA: Diagnosis not present

## 2022-03-21 ENCOUNTER — Other Ambulatory Visit (HOSPITAL_BASED_OUTPATIENT_CLINIC_OR_DEPARTMENT_OTHER): Payer: Self-pay

## 2022-03-21 DIAGNOSIS — Z0000 Encounter for general adult medical examination without abnormal findings: Secondary | ICD-10-CM

## 2022-03-21 MED ORDER — SIMVASTATIN 20 MG PO TABS: 20.0000 mg | ORAL_TABLET | Freq: Every day | ORAL | 3 refills | Status: DC

## 2022-03-22 DIAGNOSIS — C8400 Mycosis fungoides, unspecified site: Secondary | ICD-10-CM | POA: Diagnosis not present

## 2022-03-31 DIAGNOSIS — C8400 Mycosis fungoides, unspecified site: Secondary | ICD-10-CM | POA: Diagnosis not present

## 2022-04-05 DIAGNOSIS — C8400 Mycosis fungoides, unspecified site: Secondary | ICD-10-CM | POA: Diagnosis not present

## 2022-04-06 DIAGNOSIS — C8400 Mycosis fungoides, unspecified site: Secondary | ICD-10-CM | POA: Diagnosis not present

## 2022-04-06 DIAGNOSIS — Z79899 Other long term (current) drug therapy: Secondary | ICD-10-CM | POA: Diagnosis not present

## 2022-04-07 DIAGNOSIS — C8400 Mycosis fungoides, unspecified site: Secondary | ICD-10-CM | POA: Diagnosis not present

## 2022-04-10 DIAGNOSIS — C8400 Mycosis fungoides, unspecified site: Secondary | ICD-10-CM | POA: Diagnosis not present

## 2022-04-12 DIAGNOSIS — C8400 Mycosis fungoides, unspecified site: Secondary | ICD-10-CM | POA: Diagnosis not present

## 2022-04-17 DIAGNOSIS — C8400 Mycosis fungoides, unspecified site: Secondary | ICD-10-CM | POA: Diagnosis not present

## 2022-04-17 DIAGNOSIS — Z79899 Other long term (current) drug therapy: Secondary | ICD-10-CM | POA: Diagnosis not present

## 2022-04-19 DIAGNOSIS — C8400 Mycosis fungoides, unspecified site: Secondary | ICD-10-CM | POA: Diagnosis not present

## 2022-04-21 DIAGNOSIS — C8400 Mycosis fungoides, unspecified site: Secondary | ICD-10-CM | POA: Diagnosis not present

## 2022-04-24 DIAGNOSIS — C8400 Mycosis fungoides, unspecified site: Secondary | ICD-10-CM | POA: Diagnosis not present

## 2022-04-25 DIAGNOSIS — E119 Type 2 diabetes mellitus without complications: Secondary | ICD-10-CM | POA: Diagnosis not present

## 2022-04-26 DIAGNOSIS — C8400 Mycosis fungoides, unspecified site: Secondary | ICD-10-CM | POA: Diagnosis not present

## 2022-05-01 DIAGNOSIS — C8400 Mycosis fungoides, unspecified site: Secondary | ICD-10-CM | POA: Diagnosis not present

## 2022-05-03 DIAGNOSIS — C8400 Mycosis fungoides, unspecified site: Secondary | ICD-10-CM | POA: Diagnosis not present

## 2022-05-04 DIAGNOSIS — Z79899 Other long term (current) drug therapy: Secondary | ICD-10-CM | POA: Diagnosis not present

## 2022-05-04 DIAGNOSIS — C8400 Mycosis fungoides, unspecified site: Secondary | ICD-10-CM | POA: Diagnosis not present

## 2022-05-08 DIAGNOSIS — C8400 Mycosis fungoides, unspecified site: Secondary | ICD-10-CM | POA: Diagnosis not present

## 2022-05-10 DIAGNOSIS — C8400 Mycosis fungoides, unspecified site: Secondary | ICD-10-CM | POA: Diagnosis not present

## 2022-05-15 DIAGNOSIS — C8400 Mycosis fungoides, unspecified site: Secondary | ICD-10-CM | POA: Diagnosis not present

## 2022-05-17 DIAGNOSIS — C8400 Mycosis fungoides, unspecified site: Secondary | ICD-10-CM | POA: Diagnosis not present

## 2022-05-22 DIAGNOSIS — C8400 Mycosis fungoides, unspecified site: Secondary | ICD-10-CM | POA: Diagnosis not present

## 2022-05-24 DIAGNOSIS — C8400 Mycosis fungoides, unspecified site: Secondary | ICD-10-CM | POA: Diagnosis not present

## 2022-05-25 ENCOUNTER — Ambulatory Visit (HOSPITAL_BASED_OUTPATIENT_CLINIC_OR_DEPARTMENT_OTHER): Payer: Medicare PPO | Admitting: Nurse Practitioner

## 2022-05-25 ENCOUNTER — Encounter (HOSPITAL_BASED_OUTPATIENT_CLINIC_OR_DEPARTMENT_OTHER): Payer: Self-pay | Admitting: Nurse Practitioner

## 2022-05-25 VITALS — BP 122/75 | HR 81 | Ht 59.0 in | Wt 131.5 lb

## 2022-05-25 DIAGNOSIS — E782 Mixed hyperlipidemia: Secondary | ICD-10-CM

## 2022-05-25 DIAGNOSIS — E1169 Type 2 diabetes mellitus with other specified complication: Secondary | ICD-10-CM

## 2022-05-25 DIAGNOSIS — B001 Herpesviral vesicular dermatitis: Secondary | ICD-10-CM

## 2022-05-25 DIAGNOSIS — C8104 Nodular lymphocyte predominant Hodgkin lymphoma, lymph nodes of axilla and upper limb: Secondary | ICD-10-CM

## 2022-05-25 DIAGNOSIS — H6123 Impacted cerumen, bilateral: Secondary | ICD-10-CM | POA: Diagnosis not present

## 2022-05-25 DIAGNOSIS — I152 Hypertension secondary to endocrine disorders: Secondary | ICD-10-CM

## 2022-05-25 DIAGNOSIS — Z79899 Other long term (current) drug therapy: Secondary | ICD-10-CM

## 2022-05-25 DIAGNOSIS — E1159 Type 2 diabetes mellitus with other circulatory complications: Secondary | ICD-10-CM | POA: Diagnosis not present

## 2022-05-25 DIAGNOSIS — E1165 Type 2 diabetes mellitus with hyperglycemia: Secondary | ICD-10-CM

## 2022-05-25 DIAGNOSIS — M810 Age-related osteoporosis without current pathological fracture: Secondary | ICD-10-CM

## 2022-05-25 DIAGNOSIS — H9193 Unspecified hearing loss, bilateral: Secondary | ICD-10-CM | POA: Diagnosis not present

## 2022-05-25 MED ORDER — VALACYCLOVIR HCL 1 G PO TABS: 500.0000 mg | ORAL_TABLET | Freq: Two times a day (BID) | ORAL | 11 refills | Status: DC | PRN

## 2022-05-25 MED ORDER — SIMVASTATIN 20 MG PO TABS: 20.0000 mg | ORAL_TABLET | Freq: Every day | ORAL | 3 refills | Status: DC

## 2022-05-25 MED ORDER — LOSARTAN POTASSIUM 25 MG PO TABS: 25.0000 mg | ORAL_TABLET | Freq: Every day | ORAL | 3 refills | Status: DC

## 2022-05-25 NOTE — Assessment & Plan Note (Signed)
Will monitor vitamin D today. No recent falls or fractures.

## 2022-05-25 NOTE — Progress Notes (Signed)
Katherine Keeler, DNP, AGNP-c [name], Katherine Barnett [address] [phone] Office [phone] Fax  ESTABLISHED PATIENT- Chronic Health and/or Follow-Up Visit  Blood pressure 122/75, pulse 81, height '4\' 11"'$  (1.499 m), weight 131 lb 8 oz (59.6 kg), SpO2 100 %.    Katherine Barnett is a 81 y.o. year old female presenting today for evaluation and management of the following: Follow-up (Pt presents today for follow DM, she is feeling good. She is checking her sugar at home it is running good. Pt denies the flu shot today)  Lymphoma Katherine Barnett tells me that she has been started on a new medication for management of her lymphoma. She is tolerating the medication well. She reports that the medication does have an effect on her thyroid, therefore, she has to take medication for this and have monitoring every so often. She tells me that she has not had any side effects. She denies enlarged lymph nodes, increased fatigue, unusual weight loss, fever, chills, or illness. She tells me she is being very cautious against Flu and COVID and is wearing her mask when she is in public.   Hearing Katherine Barnett tells me that her hearing does not seem to be as strong as it has been. She endorses difficulty hearing and having to strain to hear certain tones. She does not have any history of hearing loss. She denies ringing in the ears, dizziness, ear fullness, or recent URI.    Diabetes Mellitus: Patient presents for follow up of diabetes. Symptoms: none. Symptoms have been well-controlled. Patient denies none.  Evaluation to date has been included: fasting blood sugar, fasting lipid panel, hemoglobin A1C, and microalbuminuria.  Home sugars: BGs consistently in an acceptable range. Treatment to date: no recent interventions.  Hypertension, follow-up  BP Readings from Last 3 Encounters:  05/25/22 122/75  11/22/21 117/72  08/15/21 128/79   Wt Readings  from Last 3 Encounters:  05/25/22 131 lb 8 oz (59.6 kg)  11/22/21 131 lb 14.4 oz (59.8 kg)  08/15/21 136 lb 4.8 oz (61.8 kg)     She was last seen for hypertension 6 months ago.   She reports excellent compliance with treatment. She is not having side effects.  She is following a Low Sodium, Diabetic diet. She is exercising. She does not smoke.  Use of agents associated with hypertension: none.   Outside blood pressures are 110-120/70's. Symptoms: No chest pain No chest pressure  No palpitations No syncope  No dyspnea No orthopnea  No paroxysmal nocturnal dyspnea No lower extremity edema   Pertinent labs Lab Results  Component Value Date   CHOL 136 07/05/2021   HDL 41 07/05/2021   LDLCALC 75 07/05/2021   TRIG 109 07/05/2021   CHOLHDL 3.3 07/05/2021   Lab Results  Component Value Date   NA 140 08/15/2021   K 3.5 08/15/2021   CREATININE 0.82 08/15/2021   GFRNONAA >60 08/15/2021   GLUCOSE 202 (H) 08/15/2021     The 10-year ASCVD risk score (Arnett DK, et al., 2019) is: 29.3%  ---------------------------------------------------------------------------------------------------     All ROS negative with exception of what is listed above.   PHYSICAL EXAM Physical Exam Vitals and nursing note reviewed.  Constitutional:      General: She is not in acute distress.    Appearance: Normal appearance.  HENT:     Head: Normocephalic.  Eyes:     Extraocular Movements: Extraocular movements intact.     Conjunctiva/sclera:  Conjunctivae normal.     Pupils: Pupils are equal, round, and reactive to light.  Neck:     Vascular: No carotid bruit.  Cardiovascular:     Rate and Rhythm: Normal rate and regular rhythm.     Pulses: Normal pulses.     Heart sounds: Normal heart sounds. No murmur heard. Pulmonary:     Effort: Pulmonary effort is normal.     Breath sounds: Normal breath sounds. No wheezing.  Abdominal:     General: Bowel sounds are normal. There is no distension.      Palpations: Abdomen is soft.     Tenderness: There is no abdominal tenderness. There is no guarding.  Musculoskeletal:        General: Normal range of motion.     Cervical back: Normal range of motion and neck supple.     Right lower leg: No edema.     Left lower leg: No edema.  Lymphadenopathy:     Cervical: No cervical adenopathy.  Skin:    General: Skin is warm and dry.     Capillary Refill: Capillary refill takes less than 2 seconds.  Neurological:     General: No focal deficit present.     Mental Status: She is alert and oriented to person, place, and time.  Psychiatric:        Mood and Affect: Mood normal.        Behavior: Behavior normal.        Thought Content: Thought content normal.        Judgment: Judgment normal.     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 Following closely with oncology at [facility]. Recent start of bexarotene and reportedly tolerating well. Continue to monitor for new or worsening fatigue, swelling, weight loss, lymph node enlargement. Continue to monitor thyroid function. Will continue to collaborate care with cardiology.       Relevant Medications   bexarotene (TARGRETIN) 75 MG CAPS capsule   valACYclovir (VALTREX) 1000 MG tablet   Type 2 diabetes mellitus with hyperglycemia (HCC) - Primary    Chronic. Most recent A1c stable. Recommend: Daily glucose monitoring, AM fasting blood sugar 70-110, 2 hours after meals blood sugar <150, 150-180 grams of carbohydrates and 13-15 grams of protein per day. Exercise at least 20 minutes per day, Blood pressure monitoring weekly with goal <130/80. Continue diabetes medications ordered Labs Today  Follow-Up: 6 months       Relevant Medications   metFORMIN (GLUCOPHAGE) 500 MG tablet   losartan (COZAAR) 25 MG tablet   simvastatin (ZOCOR) 20 MG tablet   Other Relevant Orders   Urine Microalbumin w/creat.  ratio   Hemoglobin A1c   CBC with Differential/Platelet   Comprehensive metabolic panel   VITAMIN D 25 Hydroxy (Vit-D Deficiency, Fractures)   Hypertension associated with diabetes (HCC)    Chronic.  No alarm symptoms present at this time.  Goal blood pressure less than less than 130/80.  Refills have been provided today.  Labs today to check electrolytes and kidney function.  Recommend current treatment plan is effective, no change in therapy, orders and follow up as documented in EpicCare, reviewed diet, exercise and weight control, labs ordered and recent results reviewed with patient. Currently is followed with cardiology. We will make changes to plan of care as necessary based on lab results. Plan to follow-up in 60month.  Relevant Medications   metFORMIN (GLUCOPHAGE) 500 MG tablet   losartan (COZAAR) 25 MG tablet   simvastatin (ZOCOR) 20 MG tablet   Other Relevant Orders   Urine Microalbumin w/creat. ratio   Hemoglobin A1c   CBC with Differential/Platelet   Comprehensive metabolic panel   VITAMIN D 25 Hydroxy (Vit-D Deficiency, Fractures)   DM type 2 with diabetic mixed hyperlipidemia (HCC)    Chronic. Controlled with  Simvastatin '20mg'$  at bedtime . Continue current lipid-lowering therapy.. orders written for new lab studies as appropriate; see orders. Recommend Treatment of diabetes (continue current regimen- well controlled) Treatment of hypertension (continue current medications- well controlled). Diet and exercise recommendations provided. Follow-up in 98month or sooner based on lab findings as appropriate.        Relevant Medications   metFORMIN (GLUCOPHAGE) 500 MG tablet   losartan (COZAAR) 25 MG tablet   simvastatin (ZOCOR) 20 MG tablet   Osteoporosis    Will monitor vitamin D today. No recent falls or fractures.       Bilateral hearing loss    Endorsed concerns with reduced hearing. Evaluation of ears revealed bilateral wax occulusion. Lavage and irrigation  performed in the office today without injury or concern. Patient tolerated procedure well. Hears clear, TM intact, and canal WNL following cleaning. Encouraged patient to monitor hearing and let me know if she feels as though she is still not hearing as well and we can follow-up with Audiology evaluation if needed. She reported immediate improvement of hearing, which was reassuring.       Relevant Orders   Urine Microalbumin w/creat. ratio   Hemoglobin A1c   CBC with Differential/Platelet   Comprehensive metabolic panel   VITAMIN D 25 Hydroxy (Vit-D Deficiency, Fractures)   Other Visit Diagnoses     High risk medication use       Relevant Orders   Urine Microalbumin w/creat. ratio   Hemoglobin A1c   CBC with Differential/Platelet   Comprehensive metabolic panel   VITAMIN D 25 Hydroxy (Vit-D Deficiency, Fractures)   Mixed hyperlipidemia       Relevant Medications   losartan (COZAAR) 25 MG tablet   simvastatin (ZOCOR) 20 MG tablet   Other Relevant Orders   Urine Microalbumin w/creat. ratio   Hemoglobin A1c   CBC with Differential/Platelet   Comprehensive metabolic panel   VITAMIN D 25 Hydroxy (Vit-D Deficiency, Fractures)   Cold sore       Relevant Medications   valACYclovir (VALTREX) 1000 MG tablet       Return in about 6 months (around 11/23/2022) for HTN, HLD, DM, TSH.   SWorthy Keeler DNP, AGNP-c 05/25/2022 10:26 AM

## 2022-05-25 NOTE — Assessment & Plan Note (Signed)
Chronic. Controlled with  Simvastatin '20mg'$  at bedtime . Continue current lipid-lowering therapy.. orders written for new lab studies as appropriate; see orders. Recommend Treatment of diabetes (continue current regimen- well controlled) Treatment of hypertension (continue current medications- well controlled). Diet and exercise recommendations provided. Follow-up in 14month or sooner based on lab findings as appropriate.

## 2022-05-25 NOTE — Assessment & Plan Note (Signed)
Chronic.  No alarm symptoms present at this time.  Goal blood pressure less than less than 130/80.  Refills have been provided today.  Labs today to check electrolytes and kidney function.  Recommend current treatment plan is effective, no change in therapy, orders and follow up as documented in EpicCare, reviewed diet, exercise and weight control, labs ordered and recent results reviewed with patient. Currently is followed with cardiology. We will make changes to plan of care as necessary based on lab results. Plan to follow-up in 20month.

## 2022-05-25 NOTE — Patient Instructions (Signed)
It was so good to see you today!! I am so proud of you!! If your ears start to bother you more or if you still have a hard time hearing, let me know and we can send the referral to the ear doctor for a hearing test.

## 2022-05-25 NOTE — Assessment & Plan Note (Signed)
Chronic. Most recent A1c stable. Recommend: Daily glucose monitoring, AM fasting blood sugar 70-110, 2 hours after meals blood sugar <150, 150-180 grams of carbohydrates and 13-15 grams of protein per day. Exercise at least 20 minutes per day, Blood pressure monitoring weekly with goal <130/80. Continue diabetes medications ordered Labs Today  Follow-Up: 6 months

## 2022-05-25 NOTE — Assessment & Plan Note (Signed)
Chronic. Following closely with oncology at [facility]. Recent start of bexarotene and reportedly tolerating well. Continue to monitor for new or worsening fatigue, swelling, weight loss, lymph node enlargement. Continue to monitor thyroid function. Will continue to collaborate care with cardiology.

## 2022-05-25 NOTE — Assessment & Plan Note (Signed)
Endorsed concerns with reduced hearing. Evaluation of ears revealed bilateral wax occulusion. Lavage and irrigation performed in the office today without injury or concern. Patient tolerated procedure well. Hears clear, TM intact, and canal WNL following cleaning. Encouraged patient to monitor hearing and let me know if she feels as though she is still not hearing as well and we can follow-up with Audiology evaluation if needed. She reported immediate improvement of hearing, which was reassuring.

## 2022-05-26 LAB — COMPREHENSIVE METABOLIC PANEL
ALT: 24 IU/L (ref 0–32)
AST: 25 IU/L (ref 0–40)
Albumin/Globulin Ratio: 1.8 (ref 1.2–2.2)
Albumin: 4.9 g/dL — ABNORMAL HIGH (ref 3.8–4.8)
Alkaline Phosphatase: 76 IU/L (ref 44–121)
BUN/Creatinine Ratio: 17 (ref 12–28)
BUN: 14 mg/dL (ref 8–27)
Bilirubin Total: 0.4 mg/dL (ref 0.0–1.2)
CO2: 24 mmol/L (ref 20–29)
Calcium: 10.1 mg/dL (ref 8.7–10.3)
Chloride: 105 mmol/L (ref 96–106)
Creatinine, Ser: 0.81 mg/dL (ref 0.57–1.00)
Globulin, Total: 2.7 g/dL (ref 1.5–4.5)
Glucose: 124 mg/dL — ABNORMAL HIGH (ref 70–99)
Potassium: 4.4 mmol/L (ref 3.5–5.2)
Sodium: 143 mmol/L (ref 134–144)
Total Protein: 7.6 g/dL (ref 6.0–8.5)
eGFR: 74 mL/min/{1.73_m2} (ref >59)

## 2022-05-26 LAB — CBC WITH DIFFERENTIAL/PLATELET
Basophils Absolute: 0 10*3/uL (ref 0.0–0.2)
Basos: 1 %
EOS (ABSOLUTE): 0.4 10*3/uL (ref 0.0–0.4)
Eos: 10 %
Hematocrit: 39.7 % (ref 34.0–46.6)
Hemoglobin: 12.9 g/dL (ref 11.1–15.9)
Immature Grans (Abs): 0 10*3/uL (ref 0.0–0.1)
Immature Granulocytes: 0 %
Lymphocytes Absolute: 1.5 10*3/uL (ref 0.7–3.1)
Lymphs: 43 %
MCH: 28.2 pg (ref 26.6–33.0)
MCHC: 32.5 g/dL (ref 31.5–35.7)
MCV: 87 fL (ref 79–97)
Monocytes Absolute: 0.4 10*3/uL (ref 0.1–0.9)
Monocytes: 11 %
Neutrophils Absolute: 1.2 10*3/uL — ABNORMAL LOW (ref 1.4–7.0)
Neutrophils: 35 %
Platelets: 220 10*3/uL (ref 150–450)
RBC: 4.57 x10E6/uL (ref 3.77–5.28)
RDW: 13.2 % (ref 11.7–15.4)
WBC: 3.5 10*3/uL (ref 3.4–10.8)

## 2022-05-26 LAB — HEMOGLOBIN A1C
Est. average glucose Bld gHb Est-mCnc: 151 mg/dL
Hgb A1c MFr Bld: 6.9 % — ABNORMAL HIGH (ref 4.8–5.6)

## 2022-05-26 LAB — VITAMIN D 25 HYDROXY (VIT D DEFICIENCY, FRACTURES): Vit D, 25-Hydroxy: 58.9 ng/mL (ref 30.0–100.0)

## 2022-05-29 DIAGNOSIS — C8400 Mycosis fungoides, unspecified site: Secondary | ICD-10-CM | POA: Diagnosis not present

## 2022-05-31 DIAGNOSIS — C8400 Mycosis fungoides, unspecified site: Secondary | ICD-10-CM | POA: Diagnosis not present

## 2022-06-06 DIAGNOSIS — Z79899 Other long term (current) drug therapy: Secondary | ICD-10-CM | POA: Diagnosis not present

## 2022-06-06 DIAGNOSIS — C8400 Mycosis fungoides, unspecified site: Secondary | ICD-10-CM | POA: Diagnosis not present

## 2022-06-07 DIAGNOSIS — C8400 Mycosis fungoides, unspecified site: Secondary | ICD-10-CM | POA: Diagnosis not present

## 2022-06-12 DIAGNOSIS — C8400 Mycosis fungoides, unspecified site: Secondary | ICD-10-CM | POA: Diagnosis not present

## 2022-06-14 DIAGNOSIS — C8400 Mycosis fungoides, unspecified site: Secondary | ICD-10-CM | POA: Diagnosis not present

## 2022-06-19 DIAGNOSIS — C8400 Mycosis fungoides, unspecified site: Secondary | ICD-10-CM | POA: Diagnosis not present

## 2022-06-21 DIAGNOSIS — C8400 Mycosis fungoides, unspecified site: Secondary | ICD-10-CM | POA: Diagnosis not present

## 2022-06-26 DIAGNOSIS — C8400 Mycosis fungoides, unspecified site: Secondary | ICD-10-CM | POA: Diagnosis not present

## 2022-06-29 DIAGNOSIS — C8400 Mycosis fungoides, unspecified site: Secondary | ICD-10-CM | POA: Diagnosis not present

## 2022-07-03 DIAGNOSIS — C8400 Mycosis fungoides, unspecified site: Secondary | ICD-10-CM | POA: Diagnosis not present

## 2022-07-05 DIAGNOSIS — Z79899 Other long term (current) drug therapy: Secondary | ICD-10-CM | POA: Diagnosis not present

## 2022-07-05 DIAGNOSIS — C8400 Mycosis fungoides, unspecified site: Secondary | ICD-10-CM | POA: Diagnosis not present

## 2022-07-10 DIAGNOSIS — C8400 Mycosis fungoides, unspecified site: Secondary | ICD-10-CM | POA: Diagnosis not present

## 2022-07-12 DIAGNOSIS — C8400 Mycosis fungoides, unspecified site: Secondary | ICD-10-CM | POA: Diagnosis not present

## 2022-07-19 ENCOUNTER — Telehealth: Payer: Self-pay | Admitting: Nurse Practitioner

## 2022-07-19 DIAGNOSIS — C8400 Mycosis fungoides, unspecified site: Secondary | ICD-10-CM | POA: Diagnosis not present

## 2022-07-19 DIAGNOSIS — H9193 Unspecified hearing loss, bilateral: Secondary | ICD-10-CM

## 2022-07-19 NOTE — Telephone Encounter (Signed)
Pt call and states Emeterio Reeve saw her in November and advised if hearing issues continues she needed a referral to hearing specialist. She called today to get that referral. Pt can be reached at [phone].

## 2022-07-20 ENCOUNTER — Telehealth: Payer: Self-pay | Admitting: Nurse Practitioner

## 2022-07-20 NOTE — Telephone Encounter (Signed)
Pt advised concerning referral to hearing specialist

## 2022-07-20 NOTE — Telephone Encounter (Signed)
Referral has been placed. I am not exactly sure where it is going, we will look for someone who can help her and takes her insurance. They should contact her soon.

## 2022-07-21 DIAGNOSIS — C8400 Mycosis fungoides, unspecified site: Secondary | ICD-10-CM | POA: Diagnosis not present

## 2022-07-26 DIAGNOSIS — C8400 Mycosis fungoides, unspecified site: Secondary | ICD-10-CM | POA: Diagnosis not present

## 2022-07-28 DIAGNOSIS — C8400 Mycosis fungoides, unspecified site: Secondary | ICD-10-CM | POA: Diagnosis not present

## 2022-07-31 DIAGNOSIS — C8400 Mycosis fungoides, unspecified site: Secondary | ICD-10-CM | POA: Diagnosis not present

## 2022-08-01 ENCOUNTER — Ambulatory Visit: Payer: Medicare PPO | Attending: Audiologist | Admitting: Audiologist

## 2022-08-01 DIAGNOSIS — H903 Sensorineural hearing loss, bilateral: Secondary | ICD-10-CM | POA: Diagnosis not present

## 2022-08-01 DIAGNOSIS — H9202 Otalgia, left ear: Secondary | ICD-10-CM | POA: Diagnosis not present

## 2022-08-01 NOTE — Procedures (Signed)
  Outpatient Audiology and [place], [place]  [address] [phone]  AUDIOLOGICAL  EVALUATION  NAME: Katherine Barnett     DOB:   10-21-1940      MRN: [id number]                                                                                     DATE: 08/01/2022     REFERENT: Orma Render, NP STATUS: Outpatient DIAGNOSIS: Sensorineural Hearing Loss Bilateral     History: Katherine Barnett was seen for an audiological evaluation.  Katherine Barnett is receiving a hearing evaluation due to concerns for pain and itching in her left ear. Katherine Barnett denies difficulty hearing. The pain and itching started after she had wax removed from both ears several weeks ago. Tinnitus denied in both ears. Katherine Barnett has no noise exposure history.  Medical history positive for diabetes which is a risk factor for hearing loss. No other relevant case history reported.   Evaluation:  Otoscopy showed a clear view of the tympanic membranes, bilaterally, no sign of infection or redness, skin in canals looks dry  Tympanometry results were consistent with normal middle ear function, bilaterally   Audiometric testing was completed using conventional audiometry with insert transducer. Speech Recognition Thresholds were 30 dB in the right ear and 25dB in the left ear. Word Recognition was performed 40dB SL, scored 100% in the right ear and 100% in the left ear. Pure tone thresholds show normal hearing sloping to moderately severe high freqeuncy sensorineural hearing loss in each ear.     Results:  The test results were reviewed with Katherine Barnett. Katherine Barnett has a sloping sensorineural hearing loss in each ear with the left ear slightly worse. She has lost the ability to hear high pitched consonants. Katherine Barnett denies any difficulty hearing, her main concern is the pain and itchiness in the left ear. Since these symptoms started after her cerumen removal, and current canal shows dry skin, recommend  lubrication of the canal. Patient counseled on needs for hearing aids, but she is not interested at this time in trying anything other than treating the pain and itching.   Recommendations: To treat itchiness and pain, try drops of baby oil in the left ear to lubricate.  If pain persists after lubricating ear, follow up with PCP and recommend referral to Otolaryngology due to pain and slight asymmetry.  Amplification is necessary for both ears and is highly recommended. Katherine Barnett feels strongly she does not need hearing aids at this time.  Recommend instead that Katherine Barnett have her hearing tested annually to monitor hearing loss progression and slight asymmetry.  36 minutes spent testing and counseling on results.   Alfonse Alpers  Audiologist, Au.D., CCC-A 08/01/2022  9:35 AM  Cc: Orma Render, NP

## 2022-08-02 DIAGNOSIS — C8400 Mycosis fungoides, unspecified site: Secondary | ICD-10-CM | POA: Diagnosis not present

## 2022-08-07 DIAGNOSIS — Z79899 Other long term (current) drug therapy: Secondary | ICD-10-CM | POA: Diagnosis not present

## 2022-08-07 DIAGNOSIS — C8400 Mycosis fungoides, unspecified site: Secondary | ICD-10-CM | POA: Diagnosis not present

## 2022-08-09 DIAGNOSIS — C8400 Mycosis fungoides, unspecified site: Secondary | ICD-10-CM | POA: Diagnosis not present

## 2022-08-14 DIAGNOSIS — C8400 Mycosis fungoides, unspecified site: Secondary | ICD-10-CM | POA: Diagnosis not present

## 2022-08-16 DIAGNOSIS — C8400 Mycosis fungoides, unspecified site: Secondary | ICD-10-CM | POA: Diagnosis not present

## 2022-08-21 DIAGNOSIS — Z79899 Other long term (current) drug therapy: Secondary | ICD-10-CM | POA: Diagnosis not present

## 2022-08-21 DIAGNOSIS — C8104 Nodular lymphocyte predominant Hodgkin lymphoma, lymph nodes of axilla and upper limb: Secondary | ICD-10-CM | POA: Diagnosis not present

## 2022-08-21 DIAGNOSIS — C8400 Mycosis fungoides, unspecified site: Secondary | ICD-10-CM | POA: Diagnosis not present

## 2022-08-24 ENCOUNTER — Other Ambulatory Visit: Payer: Self-pay | Admitting: Nurse Practitioner

## 2022-08-24 DIAGNOSIS — C8400 Mycosis fungoides, unspecified site: Secondary | ICD-10-CM | POA: Diagnosis not present

## 2022-08-29 DIAGNOSIS — C8400 Mycosis fungoides, unspecified site: Secondary | ICD-10-CM | POA: Diagnosis not present

## 2022-09-05 DIAGNOSIS — C8400 Mycosis fungoides, unspecified site: Secondary | ICD-10-CM | POA: Diagnosis not present

## 2022-09-12 DIAGNOSIS — C8400 Mycosis fungoides, unspecified site: Secondary | ICD-10-CM | POA: Diagnosis not present

## 2022-09-19 DIAGNOSIS — C8400 Mycosis fungoides, unspecified site: Secondary | ICD-10-CM | POA: Diagnosis not present

## 2022-09-26 DIAGNOSIS — C8400 Mycosis fungoides, unspecified site: Secondary | ICD-10-CM | POA: Diagnosis not present

## 2022-10-03 DIAGNOSIS — C8400 Mycosis fungoides, unspecified site: Secondary | ICD-10-CM | POA: Diagnosis not present

## 2022-10-06 DIAGNOSIS — Z79899 Other long term (current) drug therapy: Secondary | ICD-10-CM | POA: Diagnosis not present

## 2022-10-06 DIAGNOSIS — C8400 Mycosis fungoides, unspecified site: Secondary | ICD-10-CM | POA: Diagnosis not present

## 2022-10-10 DIAGNOSIS — C8400 Mycosis fungoides, unspecified site: Secondary | ICD-10-CM | POA: Diagnosis not present

## 2022-10-17 DIAGNOSIS — C8400 Mycosis fungoides, unspecified site: Secondary | ICD-10-CM | POA: Diagnosis not present

## 2022-10-24 DIAGNOSIS — C8400 Mycosis fungoides, unspecified site: Secondary | ICD-10-CM | POA: Diagnosis not present

## 2022-10-31 DIAGNOSIS — C8400 Mycosis fungoides, unspecified site: Secondary | ICD-10-CM | POA: Diagnosis not present

## 2022-11-07 DIAGNOSIS — C8400 Mycosis fungoides, unspecified site: Secondary | ICD-10-CM | POA: Diagnosis not present

## 2022-11-14 DIAGNOSIS — C8400 Mycosis fungoides, unspecified site: Secondary | ICD-10-CM | POA: Diagnosis not present

## 2022-11-21 DIAGNOSIS — C8400 Mycosis fungoides, unspecified site: Secondary | ICD-10-CM | POA: Diagnosis not present

## 2022-11-28 DIAGNOSIS — C8400 Mycosis fungoides, unspecified site: Secondary | ICD-10-CM | POA: Diagnosis not present

## 2022-12-05 DIAGNOSIS — C8400 Mycosis fungoides, unspecified site: Secondary | ICD-10-CM | POA: Diagnosis not present

## 2022-12-07 DIAGNOSIS — Z79899 Other long term (current) drug therapy: Secondary | ICD-10-CM | POA: Diagnosis not present

## 2022-12-07 DIAGNOSIS — C8400 Mycosis fungoides, unspecified site: Secondary | ICD-10-CM | POA: Diagnosis not present

## 2022-12-12 DIAGNOSIS — C8400 Mycosis fungoides, unspecified site: Secondary | ICD-10-CM | POA: Diagnosis not present

## 2022-12-19 DIAGNOSIS — C8400 Mycosis fungoides, unspecified site: Secondary | ICD-10-CM | POA: Diagnosis not present

## 2022-12-25 ENCOUNTER — Other Ambulatory Visit: Payer: Self-pay | Admitting: Nurse Practitioner

## 2022-12-25 DIAGNOSIS — Z1231 Encounter for screening mammogram for malignant neoplasm of breast: Secondary | ICD-10-CM

## 2022-12-26 DIAGNOSIS — C8400 Mycosis fungoides, unspecified site: Secondary | ICD-10-CM | POA: Diagnosis not present

## 2023-01-02 DIAGNOSIS — C8400 Mycosis fungoides, unspecified site: Secondary | ICD-10-CM | POA: Diagnosis not present

## 2023-01-09 DIAGNOSIS — C8400 Mycosis fungoides, unspecified site: Secondary | ICD-10-CM | POA: Diagnosis not present

## 2023-01-16 DIAGNOSIS — C8400 Mycosis fungoides, unspecified site: Secondary | ICD-10-CM | POA: Diagnosis not present

## 2023-01-23 DIAGNOSIS — C8400 Mycosis fungoides, unspecified site: Secondary | ICD-10-CM | POA: Diagnosis not present

## 2023-01-29 ENCOUNTER — Ambulatory Visit
Admission: RE | Admit: 2023-01-29 | Discharge: 2023-01-29 | Disposition: A | Discharge status: Home / Self Care | Payer: Medicare PPO | Source: Ambulatory Visit | Attending: Nurse Practitioner | Admitting: Nurse Practitioner

## 2023-01-29 DIAGNOSIS — Z1231 Encounter for screening mammogram for malignant neoplasm of breast: Secondary | ICD-10-CM | POA: Diagnosis not present

## 2023-01-29 DIAGNOSIS — C8400 Mycosis fungoides, unspecified site: Secondary | ICD-10-CM | POA: Diagnosis not present

## 2023-01-29 DIAGNOSIS — Z79899 Other long term (current) drug therapy: Secondary | ICD-10-CM | POA: Diagnosis not present

## 2023-01-30 DIAGNOSIS — C8400 Mycosis fungoides, unspecified site: Secondary | ICD-10-CM | POA: Diagnosis not present

## 2023-02-06 DIAGNOSIS — C8400 Mycosis fungoides, unspecified site: Secondary | ICD-10-CM | POA: Diagnosis not present

## 2023-02-13 DIAGNOSIS — C8400 Mycosis fungoides, unspecified site: Secondary | ICD-10-CM | POA: Diagnosis not present

## 2023-02-13 DIAGNOSIS — Z79899 Other long term (current) drug therapy: Secondary | ICD-10-CM | POA: Diagnosis not present

## 2023-02-25 ENCOUNTER — Other Ambulatory Visit: Payer: Self-pay | Admitting: Nurse Practitioner

## 2023-04-27 DIAGNOSIS — E119 Type 2 diabetes mellitus without complications: Secondary | ICD-10-CM | POA: Diagnosis not present

## 2023-05-06 ENCOUNTER — Other Ambulatory Visit (HOSPITAL_BASED_OUTPATIENT_CLINIC_OR_DEPARTMENT_OTHER): Payer: Self-pay | Admitting: Nurse Practitioner

## 2023-05-06 DIAGNOSIS — E782 Mixed hyperlipidemia: Secondary | ICD-10-CM

## 2023-05-08 DIAGNOSIS — C8400 Mycosis fungoides, unspecified site: Secondary | ICD-10-CM | POA: Diagnosis not present

## 2023-05-16 DIAGNOSIS — C8400 Mycosis fungoides, unspecified site: Secondary | ICD-10-CM | POA: Diagnosis not present

## 2023-05-25 ENCOUNTER — Other Ambulatory Visit: Payer: Self-pay | Admitting: Nurse Practitioner

## 2023-06-06 DIAGNOSIS — C8400 Mycosis fungoides, unspecified site: Secondary | ICD-10-CM | POA: Diagnosis not present

## 2023-06-12 ENCOUNTER — Encounter: Payer: Self-pay | Admitting: Nurse Practitioner

## 2023-06-12 ENCOUNTER — Ambulatory Visit (INDEPENDENT_AMBULATORY_CARE_PROVIDER_SITE_OTHER): Payer: Medicare PPO | Admitting: Nurse Practitioner

## 2023-06-12 VITALS — BP 126/76 | HR 91 | Ht 59.0 in | Wt 136.4 lb

## 2023-06-12 DIAGNOSIS — E782 Mixed hyperlipidemia: Secondary | ICD-10-CM | POA: Diagnosis not present

## 2023-06-12 DIAGNOSIS — B001 Herpesviral vesicular dermatitis: Secondary | ICD-10-CM | POA: Diagnosis not present

## 2023-06-12 DIAGNOSIS — C8104 Nodular lymphocyte predominant Hodgkin lymphoma, lymph nodes of axilla and upper limb: Secondary | ICD-10-CM

## 2023-06-12 DIAGNOSIS — Z0000 Encounter for general adult medical examination without abnormal findings: Secondary | ICD-10-CM | POA: Diagnosis not present

## 2023-06-12 DIAGNOSIS — E039 Hypothyroidism, unspecified: Secondary | ICD-10-CM | POA: Diagnosis not present

## 2023-06-12 DIAGNOSIS — I152 Hypertension secondary to endocrine disorders: Secondary | ICD-10-CM

## 2023-06-12 DIAGNOSIS — E1169 Type 2 diabetes mellitus with other specified complication: Secondary | ICD-10-CM | POA: Diagnosis not present

## 2023-06-12 DIAGNOSIS — E1159 Type 2 diabetes mellitus with other circulatory complications: Secondary | ICD-10-CM

## 2023-06-12 DIAGNOSIS — E1165 Type 2 diabetes mellitus with hyperglycemia: Secondary | ICD-10-CM

## 2023-06-12 MED ORDER — VALACYCLOVIR HCL 1 G PO TABS: 500.0000 mg | ORAL_TABLET | Freq: Two times a day (BID) | ORAL | 11 refills | Status: AC | PRN

## 2023-06-12 MED ORDER — LOSARTAN POTASSIUM 25 MG PO TABS: 25.0000 mg | ORAL_TABLET | Freq: Every day | ORAL | 3 refills | Status: DC

## 2023-06-12 MED ORDER — LEVOTHYROXINE SODIUM 50 MCG PO TABS: 50.0000 ug | ORAL_TABLET | Freq: Every day | ORAL | 1 refills | Status: DC

## 2023-06-12 NOTE — Progress Notes (Signed)
06/12/2023   Vitals:  BP 126/76   Pulse 91   Ht 4\' 11"  (1.499 m)   Wt 136 lb 6.4 oz (61.9 kg)   BMI 27.55 kg/m   Body mass index is 27.55 kg/m. Katherine Barnett is a 82 y.o. female who presents for Subsequent Medicare Annual Wellness Exam  Care Team Members: Current Providers as of 06/12/2023 PCP: Arbie Cookey Sung Amabile, NP Care Team Provider: Ria Bush, MD Care Team Provider: Si Gaul, MD Encounter Provider: Tollie Eth, NP, starting on Tue Jun 12, 2023 12:00 AM Referring Provider: Tollie Eth, NP, starting on Tue Jun 12, 2023 12:00 AM Attending Provider: Tollie Eth, NP, starting on Wed May 09, 2023  9:54 AM (Active)   Method of visit:  in person In the event virtual visit conducted, the patient consented to a virtual visit. Patient consented to have virtual visit and was identified by two identifiers.  Encounter participants: Patient: Katherine Barnett - located [facility] Patient Visit Location: In Office Nurse/Provider: Tollie Eth - located Virtual Visit Location Provider: Office/Clinic Others (if applicable): patient only   Review of Systems:  Neuro: Denies difficulty remembering daily tasks, people, or places.  Ear: Denies difficulty hearing or need to increase volume on television or telephone to hear Eye: Denies visual changes, difficulty reading normal print, or visual field deficits. Cardiac: Denies chest pain, palpitations, dizziness, shortness of breath, pain in lower extremities, or night time waking with shortness of breath. Lung: Denies shortness of breath, difficulty breathing, chronic cough, or dizziness.  GI: Denies changes in bowel habits, blood in stool, difficulty passing stool, decreased intake of food or drink, nausea, or vomiting.  GU: Denies changes in urinary habits, dark urine, malodorous urine, increased or decreased urination, or urinary incontinence.  MSK: Denies weakness in extremities, difficulty walking, difficulty  grasping, or new MSK pain.  Skin: Denies changes to the skin, fragile skin, or increased bruising.  Constitution: Denies fatigue, weakness, or confusion.   Patient rating of health: same as this time last year  Annual Goal:  Goals      DIET     Learn what foods are healthy options for diabetes control and overall health.      Patient Stated     Weight loss. I would like to loose 10 lbs over the next year. I would like to loose 5 lbs by March.          Hospitalizations in the Past Year: none  ED Visits in the Past Year: No  Surgeries in the Past Year: No   Dietary Habits: Eats 3 meals per day. Snacks sometimes Drinks at least 3 8 ounce glasses of water a day  Physical Exam: Yes-compelted Physical Exam Vitals and nursing note reviewed.  Constitutional:      Appearance: Normal appearance.  HENT:     Head: Normocephalic.     Right Ear: Tympanic membrane normal.     Left Ear: Tympanic membrane normal.     Nose: Nose normal.     Mouth/Throat:     Mouth: Mucous membranes are moist.  Eyes:     Pupils: Pupils are equal, round, and reactive to light.  Neck:     Vascular: No carotid bruit.  Cardiovascular:     Rate and Rhythm: Normal rate and regular rhythm.     Pulses: Normal pulses.     Heart sounds: Normal heart sounds.  Pulmonary:     Effort: Pulmonary effort is normal.  Breath sounds: Normal breath sounds.  Abdominal:     General: Bowel sounds are normal.     Palpations: Abdomen is soft.  Musculoskeletal:        General: Normal range of motion.     Cervical back: Normal range of motion.     Right lower leg: No edema.     Left lower leg: No edema.  Skin:    General: Skin is warm and dry.     Capillary Refill: Capillary refill takes less than 2 seconds.  Neurological:     General: No focal deficit present.     Mental Status: She is alert and oriented to person, place, and time.  Psychiatric:        Mood and Affect: Mood normal.     Activities of  Daily Living    06/12/2023    1:45 PM  In your present state of health, do you have any difficulty performing the following activities:  Hearing? 0  Vision? 0  Difficulty concentrating or making decisions? 0  Walking or climbing stairs? 0  Dressing or bathing? 0  Doing errands, shopping? 0  Preparing Food and eating ? N  Using the Toilet? N  In the past six months, have you accidently leaked urine? N  Do you have problems with loss of bowel control? N  Managing your Medications? N  Managing your Finances? N  Housekeeping or managing your Housekeeping? N    Social Determinants of Health SDOH Screenings   Food Insecurity: No Food Insecurity (11/22/2021)  Housing: Low Risk  (11/22/2021)  Transportation Needs: No Transportation Needs (11/22/2021)  Alcohol Screen: Low Risk  (11/22/2021)  Depression (PHQ2-9): Low Risk  (06/12/2023)  Financial Resource Strain: Low Risk  (11/22/2021)  Physical Activity: Sufficiently Active (11/22/2021)  Social Connections: Moderately Integrated (11/22/2021)  Stress: No Stress Concern Present (11/22/2021)  Tobacco Use: Medium Risk (06/12/2023)     Tobacco Social History   Tobacco Use  Smoking Status Former   Current packs/day: 0.00   Types: Cigarettes   Quit date: 12/17/1984   Years since quitting: 38.5   Passive exposure: Never  Smokeless Tobacco Never     Counseling given: Not Answered   Functional Status Survey: Is the patient deaf or have difficulty hearing?: No Does the patient have difficulty seeing, even when wearing glasses/contacts?: No Does the patient have difficulty concentrating, remembering, or making decisions?: No Does the patient have difficulty walking or climbing stairs?: No Does the patient have difficulty dressing or bathing?: No Does the patient have difficulty doing errands alone such as visiting a doctor's office or shopping?: No   Clinical Intake: Pre-visit preparation completed: Yes  Pain : No/denies pain      Nutritional Status: BMI 25 -29 Overweight Nutritional Risks: None Diabetes: Yes CBG done?: No Did pt. bring in CBG monitor from home?: No  Activities of Daily Living: Independent Ambulation: Independent Medication Administration: Independent Home Management: Independent  Barriers to Care Management & Learning: None  Do you feel unsafe in your current relationship?: No Do you feel physically threatened by others?: No Anyone hurting you at home, work, or school?: No Unable to ask?: No Information provided on Community resources: No  How often do you need to have someone help you when you read instructions, pamphlets, or other written materials from your doctor or pharmacy?: 1 - Never  Interpreter Needed?: No  Information entered by :: SaraBeth Capone Schwinn     06/12/2023    1:44 PM 11/22/2021  3:12 PM 08/15/2021    3:06 PM 02/09/2021   10:54 AM 04/15/2019    7:40 AM 04/08/2019    2:26 PM 03/05/2019   10:02 AM  Advanced Directives  Does Patient Have a Medical Advance Directive? Yes Yes Yes Yes  Yes Yes  Type of Estate agent of [place];Living will Healthcare Power of [place];Living will Healthcare Power of [employer] Power of [employer] Power of [place];Living will Healthcare Power of [place];Living will Healthcare Power of [place];Living will  Does patient want to make changes to medical advance directive? Yes (ED - Information included in AVS) No - Patient declined  No - Patient declined   No - Patient declined  Copy of Healthcare Power of Attorney in Chart? No - copy requested No - copy requested No - copy requested No - copy requested No - copy requested No - copy requested No - copy requested    Advanced Directives <no information>     Fall Risk    06/12/2023    1:42 PM 05/25/2022   10:26 AM 11/22/2021    2:32 PM 07/05/2021   12:24 PM 04/12/2021    5:27 PM  Fall Risk   Falls in the past year? 0 0 0 0 0  Number falls in past yr:  0 0 0 0 0  Injury with Fall? 0 0 0 0 0  Risk for fall due to : No Fall Risks No Fall Risks No Fall Risks No Fall Risks No Fall Risks  Follow up Falls evaluation completed Falls evaluation completed;Education provided Falls evaluation completed;Education provided Falls evaluation completed;Education provided Falls evaluation completed;Education provided;Falls prevention discussed   Is the patient's home free of loose throw rugs in walkways, pet beds, electrical cords, etc?   yes      Grab bars in the bathroom? yes      Handrails on the stairs?   yes      Adequate lighting?   yes  Depression Screen    06/12/2023    1:42 PM 05/25/2022   10:26 AM 11/22/2021    2:33 PM 04/12/2021    5:27 PM  PHQ 2/9 Scores  PHQ - 2 Score 0 0 0 0  PHQ- 9 Score  1      Cognitive Function Normal: Yes Exam Completed:     06/12/2023    2:16 PM  MMSE - Mini Mental State Exam  Orientation to time 5  Orientation to Place 5  Registration 3  Attention/ Calculation 5  Recall 3  Language- name 2 objects 2  Language- repeat 1  Language- follow 3 step command 3  Language- read & follow direction 1  Write a sentence 1  Copy design 1  Total score 30        11/22/2021    3:14 PM  6CIT Screen  What Year? 0 points  What month? 0 points  What time? 0 points  Count back from 20 0 points  Months in reverse 0 points  Repeat phrase 0 points  Total Score 0 points    Immunization History  Administered Date(s) Administered   PFIZER(Purple Top)SARS-COV-2 Vaccination 11/07/2019, 12/02/2019   Pfizer Covid-19 Vaccine Bivalent Booster 37yrs & up 05/09/2021   Tdap 12/17/2014    Screening Tests Health Maintenance  Topic Date Due   OPHTHALMOLOGY EXAM  04/26/2023   Diabetic kidney evaluation - eGFR measurement  05/26/2023   Pneumonia Vaccine 15+ Years old (1 of 2 - PCV) 06/11/2024 (Originally 09/29/1946)   HEMOGLOBIN A1C  12/10/2023   Diabetic kidney evaluation - Urine ACR  06/11/2024   FOOT EXAM  06/11/2024    Medicare Annual Wellness (AWV)  06/11/2024   DTaP/Tdap/Td (2 - Td or Tdap) 12/16/2024   DEXA SCAN  Completed   HPV VACCINES  Aged Out   INFLUENZA VACCINE  Discontinued   COVID-19 Vaccine  Discontinued   Hepatitis C Screening  Discontinued   Zoster Vaccines- Shingrix  Discontinued    Health Maintenance Screenings  Health Maintenance Topics with due status: Overdue     Topic Date Due   OPHTHALMOLOGY EXAM 04/26/2023   Diabetic kidney evaluation - eGFR measurement 05/26/2023    RSV Vaccine: is due and to be scheduled by patient for later completion  Past Medical History:  Diagnosis Date   Complication of anesthesia    vomited after colonoscopy   Diabetes mellitus without complication (HCC)    type 2 - diet controlled   Hodgkin's lymphoma (HCC)    Hypertension    Melanoma (HCC)    Multinodular goiter (nontoxic)    Mycosis fungoides (HCC)    Osteoporosis    Pseudophakia of left eye    Right supracondylar humerus fracture; right radial head/neck fracture 12/22/2014   Past Surgical History:  Procedure Laterality Date   AXILLARY LYMPH NODE BIOPSY Left 03/12/2019   Procedure: EXCISIONAL BIOPSY LEFT AXILLARY LYMPH NODES;  Surgeon: Abigail Miyamoto, MD;  Location: [facility] OR;  Service: General;  Laterality: Left;   BREAST EXCISIONAL BIOPSY Left    Lt axillary bx, was benign   COLONOSCOPY  10/11/2016   repeat in 10 yrs   EYE SURGERY Bilateral    cataract surgery with lens implant   ORIF HUMERUS FRACTURE Right 12/22/2014   Procedure: OPEN REDUCTION INTERNAL FIXATION (ORIF) RIGHT DISTAL HUMERUS FRACTURE;  Surgeon: Kathryne Hitch, MD;  Location: [facility] OR;  Service: Orthopedics;  Laterality: Right;   History reviewed. No pertinent family history. Social History   Socioeconomic History   Marital status: Widowed    Spouse name: Not on file   Number of children: Not on file   Years of education: Not on file   Highest education level: Not on file  Occupational History   Not on  file  Tobacco Use   Smoking status: Former    Current packs/day: 0.00    Types: Cigarettes    Quit date: 12/17/1984    Years since quitting: 38.5    Passive exposure: Never   Smokeless tobacco: Never  Vaping Use   Vaping status: Never Used  Substance and Sexual Activity   Alcohol use: Never   Drug use: Never   Sexual activity: Not Currently  Other Topics Concern   Not on file  Social History Narrative   Not on file   Social Determinants of Health   Financial Resource Strain: Low Risk  (11/22/2021)   Overall Financial Resource Strain (CARDIA)    Difficulty of Paying Living Expenses: Not hard at all  Food Insecurity: No Food Insecurity (11/22/2021)   Hunger Vital Sign    Worried About Running Out of Food in the Last Year: Never true    Ran Out of Food in the Last Year: Never true  Transportation Needs: No Transportation Needs (11/22/2021)   PRAPARE - Administrator, Civil Service (Medical): No    Lack of Transportation (Non-Medical): No  Physical Activity: Sufficiently Active (11/22/2021)   Exercise Vital Sign    Days of Exercise per Week: 7 days    Minutes of  Exercise per Session: 60 min  Stress: No Stress Concern Present (11/22/2021)   [employer] of Occupational Health - Occupational Stress Questionnaire    Feeling of Stress : Not at all  Social Connections: Moderately Integrated (11/22/2021)   Social Connection and Isolation Panel [NHANES]    Frequency of Communication with Friends and Family: More than three times a week    Frequency of Social Gatherings with Friends and Family: More than three times a week    Attends Religious Services: More than 4 times per year    Active Member of [employer] or Organizations: Yes    Attends Banker Meetings: More than 4 times per year    Marital Status: Widowed    Outpatient Encounter Medications as of 06/12/2023  Medication Sig   aspirin 81 MG tablet Take 81 mg by mouth daily.   bexarotene (TARGRETIN) 75 MG CAPS  capsule Take 1 tablet by mouth daily.   calcium-vitamin D (OSCAL WITH D) 500-200 MG-UNIT per tablet Take 1 tablet by mouth daily with breakfast.   metFORMIN (GLUCOPHAGE-XR) 500 MG 24 hr tablet TAKE 2 TABLETS EVERY DAY WITH DINNER   simvastatin (ZOCOR) 20 MG tablet TAKE 1 TABLET BY MOUTH EVERYDAY AT BEDTIME   [DISCONTINUED] levothyroxine (SYNTHROID) 50 MCG tablet Take 50 mcg by mouth daily before breakfast.   [DISCONTINUED] losartan (COZAAR) 25 MG tablet Take 1 tablet (25 mg total) by mouth daily.   [DISCONTINUED] valACYclovir (VALTREX) 1000 MG tablet Take 0.5 tablets (500 mg total) by mouth 2 (two) times daily as needed (fever blisters).   levothyroxine (SYNTHROID) 50 MCG tablet Take 1 tablet (50 mcg total) by mouth daily before breakfast.   losartan (COZAAR) 25 MG tablet Take 1 tablet (25 mg total) by mouth daily.   valACYclovir (VALTREX) 1000 MG tablet Take 0.5 tablets (500 mg total) by mouth 2 (two) times daily as needed (fever blisters).   [DISCONTINUED] hydrocortisone 2.5 % cream Apply 1-2 x per day on the week off of Triamcinolone (Patient not taking: Reported on 06/12/2023)   [DISCONTINUED] metFORMIN (GLUCOPHAGE) 500 MG tablet Take 500 mg by mouth daily with supper. (Patient not taking: Reported on 06/12/2023)   No facility-administered encounter medications on file as of 06/12/2023.    PLAN  Exercise Activities and Dietary Recommendations - choose a type of activity I enjoy such as biking, gardening, team sports, walking Carb modified diet  Fall Prevention - always use handrails on the stairs - always wear shoes or slippers with non-slip sole - get at least 10 minutes of activity every day  Preventative Screenings Lung: Low Dose CT Chest recommended if Age 81-80 years, 30 pack-year currently smoking OR have quit w/in 15years. NA HIV Screen: (once in lifetime or more often if high risk): NA Hep C Screen: NA Abdominal Aortic US (one per lifetime in men only 65-75 who have ever  smoked or have family hx AAA): NA Low Dose CT (55-75 asymptomatic w/30 pack year smoking hx, Current smoker, Quit smoking in last 15y, Yearly if abnormal): NA   Labs Needed: CBC (q 82mo for HTN): Yes CMP w/ GFR (q 82mo for HTN, DM, or DM Screening): Yes Lipids (q 82mo for HTN or DM): Yes A1c (q 35mo 7.1 or greater, q 73mo 7.0 or less DM): Yes PSA (all men 50-70 q 82mo for Prostate Ca Screening): No Urine Microalbumin (q 82mo for DM- not required if on ACE/ARB): Yes UA (as needed): No Culture (if UA positive): No  Urinary frequency, dysuria, confusion,  malodor  Orders Placed This Encounter  Procedures   Hemoglobin A1c   Microalbumin / creatinine urine ratio     I have personally reviewed and noted the following in the patient's chart:   Medical and social history Use of alcohol, tobacco or illicit drugs  Current medications and supplements Functional ability and status Nutritional status Physical activity Advanced directives List of other physicians Hospitalizations, surgeries, and ER visits in previous 12 months Vitals Screenings to include cognitive, depression, and falls Referrals and appointments  In addition, I have reviewed and discussed with patient certain preventive protocols, quality metrics, and best practice recommendations. A written personalized care plan for preventive services as well as general preventive health recommendations were provided to patient.   Tollie Eth, NP  06/12/2023

## 2023-06-12 NOTE — Patient Instructions (Signed)
  Ms. Binner , Thank you for taking time to come for your Medicare Wellness Visit. I appreciate your ongoing commitment to your health goals. Please review the following plan we discussed and let me know if I can assist you in the future.   These are the goals we discussed:  Goals      DIET     Learn what foods are healthy options for diabetes control and overall health.      Patient Stated     Weight loss. I would like to loose 10 lbs over the next year. I would like to loose 5 lbs by March.         This is a list of the screening recommended for you and due dates:  Health Maintenance  Topic Date Due   Yearly kidney health urinalysis for diabetes  Never done   Complete foot exam   11/23/2022   Hemoglobin A1C  11/23/2022   Eye exam for diabetics  04/26/2023   Yearly kidney function blood test for diabetes  05/26/2023   Pneumonia Vaccine (1 of 2 - PCV) 06/11/2024*   Medicare Annual Wellness Visit  06/11/2024   DTaP/Tdap/Td vaccine (2 - Td or Tdap) 12/16/2024   DEXA scan (bone density measurement)  Completed   HPV Vaccine  Aged Out   Flu Shot  Discontinued   COVID-19 Vaccine  Discontinued   Hepatitis C Screening  Discontinued   Zoster (Shingles) Vaccine  Discontinued  *Topic was postponed. The date shown is not the original due date.

## 2023-06-13 LAB — HEMOGLOBIN A1C
Est. average glucose Bld gHb Est-mCnc: 157 mg/dL
Hgb A1c MFr Bld: 7.1 % — ABNORMAL HIGH (ref 4.8–5.6)

## 2023-06-13 LAB — MICROALBUMIN / CREATININE URINE RATIO
Creatinine, Urine: 84 mg/dL
Microalb/Creat Ratio: 33 mg/g{creat} — ABNORMAL HIGH (ref 0–29)
Microalbumin, Urine: 27.7 ug/mL

## 2023-06-13 NOTE — Telephone Encounter (Signed)
Telephone call  

## 2023-06-18 NOTE — Assessment & Plan Note (Signed)
Well-controlled. Blood pressure within normal limits. Close monitoring with co morbidities of DM and HLD. - Refill losartan prescription

## 2023-06-18 NOTE — Assessment & Plan Note (Signed)
Currently tapering off bexarotene under dermatologist's guidance. No longer using topical medications or light therapy. Discussed tapering process and monitoring for new symptoms or side effects.   - Continue tapering bexarotene as per dermatologist's instructions   - Monitor for new symptoms or side effects   - Follow up with dermatologist for further management

## 2023-06-18 NOTE — Assessment & Plan Note (Signed)
Managed with metformin. Reports taking one pill due to gastrointestinal side effects (nausea and diarrhea) when taking two pills. Awaiting A1c results to assess glycemic control. Discussed importance of monitoring A1c and potential medication adjustments. Encouraged weight loss of 10 pounds over the next year, with an initial goal of 5 pounds by March.   - Continue metformin 500 mg once daily   - Order A1c test   - Encourage weight loss of 10 pounds over the next year, with an initial goal of 5 pounds by March

## 2023-07-28 ENCOUNTER — Other Ambulatory Visit: Payer: Self-pay | Admitting: Nurse Practitioner

## 2023-07-28 DIAGNOSIS — E039 Hypothyroidism, unspecified: Secondary | ICD-10-CM

## 2023-07-30 ENCOUNTER — Other Ambulatory Visit: Payer: Self-pay | Admitting: Nurse Practitioner

## 2023-08-09 DIAGNOSIS — C8400 Mycosis fungoides, unspecified site: Secondary | ICD-10-CM | POA: Diagnosis not present

## 2023-10-03 ENCOUNTER — Ambulatory Visit: Admitting: Medical

## 2023-10-03 VITALS — BP 110/70 | HR 86 | Temp 97.4°F | Wt 136.4 lb

## 2023-10-03 DIAGNOSIS — J029 Acute pharyngitis, unspecified: Secondary | ICD-10-CM

## 2023-10-03 DIAGNOSIS — K1379 Other lesions of oral mucosa: Secondary | ICD-10-CM | POA: Diagnosis not present

## 2023-10-03 DIAGNOSIS — R220 Localized swelling, mass and lump, head: Secondary | ICD-10-CM | POA: Diagnosis not present

## 2023-10-03 LAB — POCT RAPID STREP A (OFFICE): Rapid Strep A Screen: NEGATIVE

## 2023-10-03 MED ORDER — AMOXICILLIN-POT CLAVULANATE 875-125 MG PO TABS: 1.0000 | ORAL_TABLET | Freq: Two times a day (BID) | ORAL | 0 refills | Status: DC

## 2023-10-03 NOTE — Patient Instructions (Signed)
 Recommendations This evening and tomorrow use some sour candy or lemon drops.   Suck on some sour candy or lemon drops in the event there is a blocked duct.  Sour candy will call the duct typically to become unclogged. Tonight and tomorrow you can do a little bit of Tylenol and Aleve over-the-counter.  For example do some Aleve this evening or Tylenol alternate each of them at least 4 to 6 hours apart By tomorrow at lunch if no improvement at all either continue those recommendations a little bit longer or you could begin Augmentin antibiotic in the event of possible infection Use pured foods or smoothies or soft foods tonight and tomorrow to give your jaw some rest If not seeing any improvement over the next several days consider seeing a dentist for further evaluation If no improvement or new symptoms or not sure about your symptoms over the next few days you are welcome to call or send a MyChart message The main exam finding today is some swelling within the soft tissue of your cheek but no other obvious finding

## 2023-10-03 NOTE — Progress Notes (Signed)
 Subjective:  Katherine Barnett is a 83 y.o. female who presents for Chief Complaint  Patient presents with   Facial Swelling    Facial swelling on right side, hurts to swallow and ear pain x 1 week.      Here for concerns about facial swelling and hurts to swallow and eat.  She has dentures upper and lower.  She notes that this discomfort has been going on for the past week and she is puffy in her right lower face.  She denies fever, body aches or chills.  No URI symptoms, does not feel sick at this time is discomfort when she tries to eat out.  No history of TMJ.  She has not seen her dentist in about 3 years.  She did take 2 Tylenol today and that did help some.  No neck pain.  No rash No other aggravating or relieving factors.    No other c/o.  The following portions of the patient's history were reviewed and updated as appropriate: allergies, current medications, past family history, past medical history, past social history, past surgical history and problem list.  ROS Otherwise as in subjective above  Objective: BP 110/70   Pulse 86   Temp (!) 97.4 F (36.3 C)   Wt 136 lb 7 oz (61.9 kg)   BMI 27.56 kg/m   General appearance: alert, no distress, well developed, well nourished HEENT: Puffy swelling of the right lower cheek.  Normocephalic, sclerae anicteric, conjunctiva pink and moist, TMs pearly, nares patent, no discharge or erythema, pharynx normal Oral cavity: Upper and lower with some partial areas of atrophy.  No obvious tooth decay or tooth issue or obvious abscess in the lower right mouth.  MMM, no lesions Neck: supple, no lymphadenopathy, no thyromegaly, no masses    Assessment: Encounter Diagnoses  Name Primary?   Swollen face Yes   Sore throat    Mouth discomfort      Plan: We discussed symptoms and concerns.  We discussed the possibility of blocked duct versus infection versus TMJ or other.  Recommendations This evening and tomorrow use some sour  candy or lemon drops.   Suck on some sour candy or lemon drops in the event there is a blocked duct.  Sour candy will call the duct typically to become unclogged. Tonight and tomorrow you can do a little bit of Tylenol and Aleve over-the-counter.  For example do some Aleve this evening or Tylenol alternate each of them at least 4 to 6 hours apart By tomorrow at lunch if no improvement at all either continue those recommendations a little bit longer or you could begin Augmentin antibiotic in the event of possible infection Use pured foods or smoothies or soft foods tonight and tomorrow to give your jaw some rest If not seeing any improvement over the next several days consider seeing a dentist for further evaluation If no improvement or new symptoms or not sure about your symptoms over the next few days you are welcome to call or send a MyChart message The main exam finding today is some swelling within the soft tissue of your cheek but no other obvious finding  Katherine "Dennie Bible" was seen today for facial swelling.  Diagnoses and all orders for this visit:  Swollen face  Sore throat -     Rapid Strep A  Mouth discomfort  Other orders -     amoxicillin-clavulanate (AUGMENTIN) 875-125 MG tablet; Take 1 tablet by mouth 2 (two) times daily.  Follow up: prn

## 2023-10-07 ENCOUNTER — Emergency Department (HOSPITAL_COMMUNITY)

## 2023-10-07 ENCOUNTER — Ambulatory Visit (INDEPENDENT_AMBULATORY_CARE_PROVIDER_SITE_OTHER)

## 2023-10-07 ENCOUNTER — Emergency Department (HOSPITAL_COMMUNITY)
Admission: EM | Admit: 2023-10-07 | Discharge: 2023-10-07 | Disposition: A | Discharge status: Home / Self Care | Attending: Emergency Medicine | Admitting: Emergency Medicine

## 2023-10-07 ENCOUNTER — Encounter (HOSPITAL_COMMUNITY): Payer: Self-pay | Admitting: Emergency Medicine

## 2023-10-07 ENCOUNTER — Ambulatory Visit (HOSPITAL_COMMUNITY)
Admission: EM | Admit: 2023-10-07 | Discharge: 2023-10-07 | Disposition: A | Discharge status: Home / Self Care | Attending: Family Medicine | Admitting: Family Medicine

## 2023-10-07 ENCOUNTER — Encounter (HOSPITAL_COMMUNITY): Payer: Self-pay

## 2023-10-07 DIAGNOSIS — R220 Localized swelling, mass and lump, head: Secondary | ICD-10-CM | POA: Diagnosis not present

## 2023-10-07 DIAGNOSIS — Z7984 Long term (current) use of oral hypoglycemic drugs: Secondary | ICD-10-CM | POA: Insufficient documentation

## 2023-10-07 DIAGNOSIS — M542 Cervicalgia: Secondary | ICD-10-CM | POA: Diagnosis not present

## 2023-10-07 DIAGNOSIS — R070 Pain in throat: Secondary | ICD-10-CM | POA: Diagnosis not present

## 2023-10-07 DIAGNOSIS — R221 Localized swelling, mass and lump, neck: Secondary | ICD-10-CM

## 2023-10-07 DIAGNOSIS — Z79899 Other long term (current) drug therapy: Secondary | ICD-10-CM | POA: Insufficient documentation

## 2023-10-07 DIAGNOSIS — I10 Essential (primary) hypertension: Secondary | ICD-10-CM | POA: Insufficient documentation

## 2023-10-07 DIAGNOSIS — Z7982 Long term (current) use of aspirin: Secondary | ICD-10-CM | POA: Insufficient documentation

## 2023-10-07 DIAGNOSIS — M26651 Arthropathy of right temporomandibular joint: Secondary | ICD-10-CM | POA: Diagnosis not present

## 2023-10-07 DIAGNOSIS — E119 Type 2 diabetes mellitus without complications: Secondary | ICD-10-CM | POA: Insufficient documentation

## 2023-10-07 DIAGNOSIS — M26629 Arthralgia of temporomandibular joint, unspecified side: Secondary | ICD-10-CM

## 2023-10-07 DIAGNOSIS — J029 Acute pharyngitis, unspecified: Secondary | ICD-10-CM | POA: Diagnosis not present

## 2023-10-07 DIAGNOSIS — R252 Cramp and spasm: Secondary | ICD-10-CM

## 2023-10-07 DIAGNOSIS — M26621 Arthralgia of right temporomandibular joint: Secondary | ICD-10-CM | POA: Diagnosis not present

## 2023-10-07 DIAGNOSIS — M47812 Spondylosis without myelopathy or radiculopathy, cervical region: Secondary | ICD-10-CM | POA: Diagnosis not present

## 2023-10-07 DIAGNOSIS — R1310 Dysphagia, unspecified: Secondary | ICD-10-CM | POA: Diagnosis not present

## 2023-10-07 DIAGNOSIS — R6884 Jaw pain: Secondary | ICD-10-CM | POA: Diagnosis not present

## 2023-10-07 LAB — CBC
HCT: 40.8 % (ref 36.0–46.0)
Hemoglobin: 13.6 g/dL (ref 12.0–15.0)
MCH: 28.5 pg (ref 26.0–34.0)
MCHC: 33.3 g/dL (ref 30.0–36.0)
MCV: 85.5 fL (ref 80.0–100.0)
Platelets: 208 10*3/uL (ref 150–400)
RBC: 4.77 MIL/uL (ref 3.87–5.11)
RDW: 12.5 % (ref 11.5–15.5)
WBC: 4 10*3/uL (ref 4.0–10.5)
nRBC: 0 % (ref 0.0–0.2)

## 2023-10-07 LAB — POC COVID19/FLU A&B COMBO
Covid Antigen, POC: NEGATIVE
Influenza A Antigen, POC: NEGATIVE
Influenza B Antigen, POC: NEGATIVE

## 2023-10-07 LAB — COMPREHENSIVE METABOLIC PANEL
ALT: 24 U/L (ref 0–44)
AST: 21 U/L (ref 15–41)
Albumin: 4.4 g/dL (ref 3.5–5.0)
Alkaline Phosphatase: 63 U/L (ref 38–126)
Anion gap: 15 (ref 5–15)
BUN: 11 mg/dL (ref 8–23)
CO2: 21 mmol/L — ABNORMAL LOW (ref 22–32)
Calcium: 9.8 mg/dL (ref 8.9–10.3)
Chloride: 106 mmol/L (ref 98–111)
Creatinine, Ser: 0.73 mg/dL (ref 0.44–1.00)
GFR, Estimated: >60 mL/min (ref >60)
Glucose, Bld: 148 mg/dL — ABNORMAL HIGH (ref 70–99)
Potassium: 4 mmol/L (ref 3.5–5.1)
Sodium: 142 mmol/L (ref 135–145)
Total Bilirubin: 0.6 mg/dL (ref 0.0–1.2)
Total Protein: 7.6 g/dL (ref 6.5–8.1)

## 2023-10-07 LAB — GROUP A STREP BY PCR: Group A Strep by PCR: NOT DETECTED

## 2023-10-07 LAB — POCT RAPID STREP A (OFFICE): Rapid Strep A Screen: NEGATIVE

## 2023-10-07 MED ORDER — SODIUM CHLORIDE 0.9 % IV BOLUS
500.0000 mL | Freq: Once | INTRAVENOUS | Status: AC
  Administered 2023-10-07: 500 mL via INTRAVENOUS

## 2023-10-07 MED ORDER — IOHEXOL 350 MG/ML SOLN
75.0000 mL | Freq: Once | INTRAVENOUS | Status: AC | PRN
  Administered 2023-10-07: 75 mL via INTRAVENOUS

## 2023-10-07 MED ORDER — LIDOCAINE VISCOUS HCL 2 % MT SOLN: 15.0000 mL | OROMUCOSAL | 0 refills | Status: DC | PRN

## 2023-10-07 MED ORDER — DICLOFENAC SODIUM 1 % EX GEL: 2.0000 g | Freq: Four times a day (QID) | CUTANEOUS | 0 refills | Status: DC

## 2023-10-07 MED ORDER — MORPHINE SULFATE (PF) 4 MG/ML IV SOLN
4.0000 mg | Freq: Once | INTRAVENOUS | Status: AC
  Administered 2023-10-07: 4 mg via INTRAVENOUS
  Filled 2023-10-07: qty 1

## 2023-10-07 NOTE — ED Provider Notes (Signed)
 [facility]    CSN: [id number] Arrival date & time: 10/07/23  1010      History   Chief Complaint Chief Complaint  Patient presents with   Sore Throat    HPI Katherine Barnett is a 83 y.o. female.   The history is provided by the patient. No language interpreter was used.  Sore Throat This is a new problem. Episode onset: Throat pain started 5 days ago. The problem occurs constantly. The problem has been gradually worsening. Pertinent negatives include no chest pain, no abdominal pain, no headaches and no shortness of breath. Associated symptoms comments: Pain radiated to her right ear. Throat pain is worse with swallowing. She has swollen jaw and sinus pressure with pain. Denies fever at home. She endorsed muffled voice for the last few days. . The symptoms are aggravated by swallowing (Talking and toching her face makes it painful). The treatment provided no relief.  Went to PCP 4 days ago and was started on Amoxicillin which did not help with her symptoms. She also takes Tylenol as needed for pain. Denies sick contact.  HTN: Did not use her medication yet today.  Past Medical History:  Diagnosis Date   Complication of anesthesia    vomited after colonoscopy   Diabetes mellitus without complication (HCC)    type 2 - diet controlled   Hodgkin's lymphoma (HCC)    Hypertension    Melanoma (HCC)    Multinodular goiter (nontoxic)    Mycosis fungoides (HCC)    Osteoporosis    Pseudophakia of left eye    Right supracondylar humerus fracture; right radial head/neck fracture 12/22/2014    Patient Active Problem List   Diagnosis Date Noted   Bilateral hearing loss 05/25/2022   Type 2 diabetes mellitus with hyperglycemia (HCC) 04/12/2021   Hypertension associated with diabetes (HCC) 04/12/2021   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/19/2020   Encounter for annual wellness visit (AWV) in Medicare patient 05/06/2019   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/2019   Postmenopausal 03/13/2012   DM type 2 with diabetic mixed hyperlipidemia (HCC) 02/14/2012   Osteoporosis 02/14/2012    Past Surgical History:  Procedure Laterality Date   AXILLARY LYMPH NODE BIOPSY Left 03/12/2019   Procedure: EXCISIONAL BIOPSY LEFT AXILLARY LYMPH NODES;  Surgeon: Abigail Miyamoto, MD;  Location: [facility];  Service: General;  Laterality: Left;   BREAST EXCISIONAL BIOPSY Left    Lt axillary bx, was benign   COLONOSCOPY  10/11/2016   repeat in 10 yrs   EYE SURGERY Bilateral    cataract surgery with lens implant   ORIF HUMERUS FRACTURE Right 12/22/2014   Procedure: OPEN REDUCTION INTERNAL FIXATION (ORIF) RIGHT DISTAL HUMERUS FRACTURE;  Surgeon: Kathryne Hitch, MD;  Location: [facility];  Service: Orthopedics;  Laterality: Right;    OB History   No obstetric history on file.      Home Medications    Prior to Admission medications   Medication Sig Start Date End Date Taking? Authorizing Provider  amoxicillin-clavulanate (AUGMENTIN) 875-125 MG tablet Take 1 tablet by mouth 2 (two) times daily. 10/03/23  Yes Tysinger, Kermit Balo, PA-C  aspirin 81 MG tablet Take 81 mg by mouth daily.   Yes [provider]  bexarotene (TARGRETIN) 75 MG CAPS capsule Take 1 tablet by mouth daily. 05/25/22  Yes [provider]  calcium-vitamin D (OSCAL WITH D) 500-200 MG-UNIT per tablet Take 1 tablet by mouth daily with breakfast.   Yes  [provider]  levothyroxine (SYNTHROID) 50 MCG tablet TAKE 1 TABLET BY MOUTH DAILY BEFORE BREAKFAST 07/30/23  Yes Early, Sung Amabile, NP  losartan (COZAAR) 25 MG tablet Take 1 tablet (25 mg total) by mouth daily. 06/12/23  Yes Early, Sung Amabile, NP  metFORMIN (GLUCOPHAGE-XR) 500 MG 24 hr tablet TAKE 2 TABLETS EVERY DAY WITH DINNER 07/30/23  Yes Early, Sung Amabile, NP  simvastatin (ZOCOR) 20 MG tablet TAKE 1 TABLET BY MOUTH EVERYDAY AT BEDTIME 05/07/23  Yes Early, Sung Amabile, NP  valACYclovir (VALTREX) 1000 MG tablet  Take 0.5 tablets (500 mg total) by mouth 2 (two) times daily as needed (fever blisters). 06/12/23  Yes Early, Sung Amabile, NP    Family History History reviewed. No pertinent family history.  Social History Social History   Tobacco Use   Smoking status: Former    Current packs/day: 0.00    Types: Cigarettes    Quit date: 12/17/1984    Years since quitting: 38.8    Passive exposure: Never   Smokeless tobacco: Never  Vaping Use   Vaping status: Never Used  Substance Use Topics   Alcohol use: Never   Drug use: Never     Allergies   Patient has no known allergies.   Review of Systems Review of Systems  Respiratory:  Negative for shortness of breath.   Cardiovascular:  Negative for chest pain.  Gastrointestinal:  Negative for abdominal pain.  Neurological:  Negative for headaches.  All other systems reviewed and are negative.    Physical Exam Triage Vital Signs ED Triage Vitals  Encounter Vitals Group     BP 10/07/23 1033 (!) 153/93     Systolic BP Percentile --      Diastolic BP Percentile --      Pulse Rate 10/07/23 1033 89     Resp 10/07/23 1033 18     Temp 10/07/23 1033 98.6 F (37 C)     Temp Source 10/07/23 1033 Oral     SpO2 10/07/23 1033 97 %     Weight 10/07/23 1033 136 lb 7.4 oz (61.9 kg)     Height 10/07/23 1033 4\' 11"  (1.499 m)     Head Circumference --      Peak Flow --      Pain Score 10/07/23 1031 10     Pain Loc --      Pain Education --      Exclude from Growth Chart --    No data found.  Updated Vital Signs BP (!) 160/103   Pulse 89   Temp 98.6 F (37 C) (Oral)   Resp 18   Ht 4\' 11"  (1.499 m)   Wt 61.9 kg   SpO2 97%   BMI 27.56 kg/m   Visual Acuity Right Eye Distance:   Left Eye Distance:   Bilateral Distance:    Right Eye Near:   Left Eye Near:    Bilateral Near:     Physical Exam Vitals and nursing note reviewed.  Constitutional:      General: She is not in acute distress.    Appearance: She is not toxic-appearing.      Comments: In mild to moderate distress due to pain - she was teary  HENT:     Right Ear: Tympanic membrane and ear canal normal. No drainage, swelling or tenderness. Tympanic membrane is not erythematous.     Left Ear: Tympanic membrane and ear canal normal. No drainage, swelling or tenderness. Tympanic membrane is not erythematous.  Mouth/Throat:     Comments: Unable to fully open her mouth for full throat assessment Eyes:     Extraocular Movements: Extraocular movements intact.     Conjunctiva/sclera: Conjunctivae normal.     Pupils: Pupils are equal, round, and reactive to light.  Neck:     Comments: Moderate right submandibular gland swelling and tenderness. No facial swelling or erythema or tenderness Cardiovascular:     Rate and Rhythm: Normal rate and regular rhythm.     Heart sounds: Normal heart sounds. No murmur heard. Pulmonary:     Effort: Pulmonary effort is normal. No respiratory distress.     Breath sounds: Normal breath sounds. No wheezing.      UC Treatments / Results  Labs (all labs ordered are listed, but only abnormal results are displayed) Labs Reviewed  POCT RAPID STREP A (OFFICE)  POC COVID19/FLU A&B COMBO    EKG   Radiology No results found.  Procedures Procedures (including critical care time)  Medications Ordered in UC Medications - No data to display  Initial Impression / Assessment and Plan / UC Course  I have reviewed the triage vital signs and the nursing notes.  Pertinent labs & imaging results that were available during my care of the patient were reviewed by me and considered in my medical decision making (see chart for details).  Clinical Course as of 10/07/23 1145  Sun Oct 07, 2023  1132 Throat pain with Trismus and muffled voice Rapid strep test, COVID-19, and influenza test were all negative The neck x-ray was done, and the image was reviewed with her. However, the official report is pending at this time. ?? Peritonsillar  abscess She will benefit from CT neck Options was given to wait for the x-ray report, trial outpatient A/B, and if there is no improvement to go to the ED, vs. ED evaluation today with CT scan and potential IV A/B She opted for ED eval which is reasonable. Her sister-in-law will drive her over to the ED. She is otherwise hemodynamically stable.  [KE]  1132 HTN Did not take meds today She will resume her home meds as soon as possible. [KE]  1133 Right otalgia Normal ear exam Likely due to referred pain Tylenol as needed for pain [KE]    Clinical Course User Index [KE] Doreene Eland, MD   Final Clinical Impressions(s) / UC Diagnoses   Final diagnoses:  Submandibular swelling  Pharyngitis, unspecified etiology  Trismus     Discharge Instructions      It was nice seeing you today. Your COVID-19, Influenza, and strep tests were negative. You might have an abscess in your neck, causing your symptoms. I'll recommend a CT of your neck to further assess this as your neck x-ray is negative. This can be done at the ED. Please go straight to the ED.     ED Prescriptions   None    PDMP not reviewed this encounter.   Doreene Eland, MD 10/07/23 1145

## 2023-10-07 NOTE — ED Notes (Signed)
 Patient is being discharged from the Urgent Care and sent to the Emergency Department via self . Per provider, patient is in need of higher level of care due to neck pain. Patient is aware and verbalizes understanding of plan of care.  Vitals:   10/07/23 1033 10/07/23 1053  BP: (!) 153/93 (!) 160/103  Pulse: 89 89  Resp: 18   Temp: 98.6 F (37 C)   SpO2: 97%

## 2023-10-07 NOTE — ED Triage Notes (Signed)
 Chief Complaint: pain with swallowing, right side facial pain from the throat to the right ear, congestion, and runny nose. Denies cough and fever.    Sick exposure: No  Onset: this past Tuesday  Prescriptions or OTC medications tried: Yes- Amoxicillin and tylenol     with little relief  New foods, medications, or products: No  Recent Travel: No

## 2023-10-07 NOTE — ED Provider Notes (Signed)
 Katherine Barnett Provider Note   CSN: [id number] Arrival date & time: 10/07/23  1204     History  Chief Complaint  Patient presents with   Jaw Pain    Katherine Barnett is a 83 y.o. female,  hx of HTN, DMII, Hodgkin's lymphoma, who presents to the ED 2/2 to right-sided jaw pain, this been going on for the last 4 to 5 days.  She states that her throat, is very painful,, and she cannot even swallow anymore.  She states it hurts so much to swallow, that she has not been eating or drinking anything.  Denies any fevers or chills.  Has been able to put on her partials, but has difficult time opening her mouth completely she states.  Notes that she has had a little swelling to the right side of her face, but denies any kind of redness, or rash.  Has not had any known trauma, or falls.  Denies any recent dental work.  Denies any chest pain, or shortness of breath.  Was prescribed Augmentin, by a primary care doctor, but has not been able to take it, because she has difficulty swallowing the pills.  Has been taking Motrin, and Tylenol, with little relief.  Home Medications Prior to Admission medications   Medication Sig Start Date End Date Taking? Authorizing Provider  diclofenac Sodium (VOLTAREN) 1 % GEL Apply 2 g topically 4 (four) times daily. 10/07/23  Yes Zinia Innocent L, PA  lidocaine (XYLOCAINE) 2 % solution Use as directed 15 mLs in the mouth or throat as needed for mouth pain. 10/07/23  Yes Cyncere Sontag L, PA  amoxicillin-clavulanate (AUGMENTIN) 875-125 MG tablet Take 1 tablet by mouth 2 (two) times daily. 10/03/23   Tysinger, Kermit Balo, PA-C  aspirin 81 MG tablet Take 81 mg by mouth daily.    [provider]  bexarotene (TARGRETIN) 75 MG CAPS capsule Take 1 tablet by mouth daily. 05/25/22   [provider]  calcium-vitamin D (OSCAL WITH D) 500-200 MG-UNIT per tablet Take 1 tablet by mouth daily with breakfast.    [provider]  levothyroxine (SYNTHROID) 50 MCG tablet TAKE 1 TABLET BY MOUTH DAILY BEFORE BREAKFAST 07/30/23   Early, Sung Amabile, NP  losartan (COZAAR) 25 MG tablet Take 1 tablet (25 mg total) by mouth daily. 06/12/23   Tollie Eth, NP  metFORMIN (GLUCOPHAGE-XR) 500 MG 24 hr tablet TAKE 2 TABLETS EVERY DAY WITH DINNER 07/30/23   Early, Sung Amabile, NP  simvastatin (ZOCOR) 20 MG tablet TAKE 1 TABLET BY MOUTH EVERYDAY AT BEDTIME 05/07/23   Early, Sung Amabile, NP  valACYclovir (VALTREX) 1000 MG tablet Take 0.5 tablets (500 mg total) by mouth 2 (two) times daily as needed (fever blisters). 06/12/23   Tollie Eth, NP      Allergies    Patient has no known allergies.    Review of Systems   Review of Systems  Constitutional:  Negative for fever.  HENT:  Positive for facial swelling.     Physical Exam Updated Vital Signs BP 130/78 (BP Location: Right Arm)   Pulse 92   Temp 98.8 F (37.1 C) (Oral)   Resp 14   SpO2 96%  Physical Exam Vitals and nursing note reviewed.  Constitutional:      General: She is not in acute distress.    Appearance: She is well-developed.  HENT:     Head: Normocephalic and atraumatic.     Right Ear:  Tympanic membrane normal.     Left Ear: Tympanic membrane normal.     Nose: Nose normal.     Mouth/Throat:     Comments: +partials present. No erythema of face or mouth. Tonsils unable to  be visualized d/t patient compliance. +possible mild overlying edema or right sided maxilla.  Eyes:     Conjunctiva/sclera: Conjunctivae normal.  Cardiovascular:     Rate and Rhythm: Normal rate and regular rhythm.     Heart sounds: No murmur heard. Pulmonary:     Effort: Pulmonary effort is normal. No respiratory distress.     Breath sounds: Normal breath sounds.  Abdominal:     Palpations: Abdomen is soft.     Tenderness: There is no abdominal tenderness.  Musculoskeletal:        General: No swelling.     Cervical back: Neck supple.  Skin:    General: Skin is warm and dry.      Capillary Refill: Capillary refill takes less than 2 seconds.  Neurological:     Mental Status: She is alert.  Psychiatric:        Mood and Affect: Mood normal.     ED Results / Procedures / Treatments   Labs (all labs ordered are listed, but only abnormal results are displayed) Labs Reviewed  COMPREHENSIVE METABOLIC PANEL - Abnormal; Notable for the following components:      Result Value   CO2 21 (*)    Glucose, Bld 148 (*)    All other components within normal limits  GROUP A STREP BY PCR  CBC    EKG None  Radiology CT Soft Tissue Neck W Contrast Result Date: 10/07/2023 CLINICAL DATA:  Epiglottitis or tonsillitis suspected. Right-sided pain. Difficulty swallowing. EXAM: CT NECK WITH CONTRAST TECHNIQUE: Multidetector CT imaging of the neck was performed using the standard protocol following the bolus administration of intravenous contrast. RADIATION DOSE REDUCTION: This exam was performed according to the departmental dose-optimization program which includes automated exposure control, adjustment of the mA and/or kV according to patient size and/or use of iterative reconstruction technique. CONTRAST:  75mL OMNIPAQUE IOHEXOL 350 MG/ML SOLN COMPARISON:  Neck radiography same day FINDINGS: Pharynx and larynx: Question mild pharyngitis. No evidence of tonsillitis or epiglottitis. No advanced finding. No parapharyngeal inflammatory changes. Salivary glands: Parotid and submandibular glands are normal. Thyroid: No significant thyroid finding. Lymph nodes: No neck lymphadenopathy. Left subpectoral node adjacent to the chest wall measuring 16 x 10 mm. Significance uncertain. This was present on a CT scan of the chest in July of 2021 and is not further enlarged since that time, suggesting benign nature. Vascular: No significant vascular finding. Limited intracranial: Normal Visualized orbits: Normal Mastoids and visualized paranasal sinuses: Clear Skeleton: Ordinary cervical spondylosis. Chronic  arthropathy of the temporomandibular joints, right worse than left, which could be symptomatic. Upper chest: Upper mediastinum is negative.  Lung apices are clear. Other: None IMPRESSION: 1. Question mild pharyngitis. No evidence of tonsillitis or epiglottitis. No advanced finding. 2. Left subpectoral node adjacent to the chest wall measuring 16 x 10 mm. Significance uncertain. This was present on a CT scan of the chest in July of 2021 and is not further enlarged since that time, suggesting benign nature. 3. Chronic arthropathy of the temporomandibular joints, worse on the right than the left. Could this relate to part of the patient's pain? No evidence of mastoiditis or otitis. Electronically Signed   By: Paulina Fusi M.D.   On: 10/07/2023 17:11   DG Neck  Soft Tissue Result Date: 10/07/2023 CLINICAL DATA:  Extreme pain to right side of throat. Right submandibular swelling and tenderness. EXAM: NECK SOFT TISSUES - 1+ VIEW COMPARISON:  None Available. FINDINGS: There is no evidence of retropharyngeal soft tissue swelling or epiglottic enlargement. The cervical airway is unremarkable and no radio-opaque foreign body identified. IMPRESSION: 1. No signs retroperitoneal soft tissue swelling. 2. If there is a clinical concern for abscess then the study of choice would be a contrast enhanced soft tissue neck CT. Electronically Signed   By: Signa Kell M.D.   On: 10/07/2023 11:48    Procedures Procedures    Medications Ordered in ED Medications  sodium chloride 0.9 % bolus 500 mL (0 mLs Intravenous Stopped 10/07/23 1701)  morphine (PF) 4 MG/ML injection 4 mg (4 mg Intravenous Given 10/07/23 1555)  iohexol (OMNIPAQUE) 350 MG/ML injection 75 mL (75 mLs Intravenous Contrast Given 10/07/23 1642)    ED Course/ Medical Decision Making/ A&P                                 Medical Decision Making Patient is an 83 year old female, here for facial pain, this been going on for the last 5 days.  Was prescribed  Augmentin, but has not been taking it, because it hurts to take pills.  She has a complains of a sore throat, right-sided facial pain.  She has mild swelling possibly, but it would be pretty minimal at most.  She has no overlying erythema, unable to open mouth, fully.  States hurts more when opening mouth, and also complains of sore throat.  Will test for strep, as well as do a CT of her face, given difficulty opening mouth and history of diabetes.  Morphine for pain  Amount and/or Complexity of Data Reviewed Labs: ordered.    Details: Unremarkable labs, negative strep Radiology: ordered.    Details: Mild pharyngitis, no tonsillitis, likely benign subpectoral node, severe TMJ arthropathy, right greater than left Discussion of management or test interpretation with external provider(s): Discussed with patient, CT is fairly unremarkable except for findings of mild pharyngitis, she strep negative, has no evidence of tonsillitis, thus we will give her lidocaine for the pain, likely viral in nature.  Additionally patient here for facial pain, found to have no masses, given opening mouth makes pain more painful, likely secondary to TMJ especially given severe TMJ arthropathy, seen on CT.  I prescribed diclofenac gel, to apply to the patient's face, for pain control, as well as discussed Tylenol, heat and ice.  Instructed her she will need to follow-up with a dentist, for further management, to make sure her dentures fit well, and that she is not grinding her teeth at night.  We discussed turn precautions and she voiced understanding  Risk Prescription drug management.    Final Clinical Impression(s) / ED Diagnoses Final diagnoses:  Pharyngitis, unspecified etiology  TMJ syndrome    Rx / DC Orders ED Discharge Orders          Ordered    lidocaine (XYLOCAINE) 2 % solution  As needed        10/07/23 1827    diclofenac Sodium (VOLTAREN) 1 % GEL  4 times daily        10/07/23 1827               Cecil Vandyke, Harley [place], PA 10/07/23 1832    Gwyneth Sprout, MD 10/10/23 [phone]

## 2023-10-07 NOTE — Discharge Instructions (Signed)
 It was nice seeing you today. Your COVID-19, Influenza, and strep tests were negative. You might have an abscess in your neck, causing your symptoms. I'll recommend a CT of your neck to further assess this as your neck x-ray is negative. This can be done at the ED. Please go straight to the ED.

## 2023-10-07 NOTE — Discharge Instructions (Signed)
 I believe that your pain is likely secondary to TMJ, this can be caused from grinding teeth, or poor fending degenerative tears.  Please follow-up with your dentist, for further evaluation.  You can place the cream on your face, up to 4 times a day, to help with the pain, and take Tylenol.  You are also use heat or ice, to help with the pain.  Additionally you have a sore throat, you tested negative for strep, I think that your sore throat is likely secondary to a virus.  Make sure you are doing salt water gargles, and use the lidocaine solution, as needed, for sore throat.  Return to the ER if you have worsening pain or difficulty opening mouth or breathing.

## 2023-10-07 NOTE — ED Triage Notes (Signed)
 Pt here from home with c/o right jaw pain for the last four days hurts to talk swallow and touch , started on antibiotics 2 days ago

## 2023-10-08 ENCOUNTER — Other Ambulatory Visit (HOSPITAL_BASED_OUTPATIENT_CLINIC_OR_DEPARTMENT_OTHER): Payer: Self-pay | Admitting: Nurse Practitioner

## 2023-10-08 DIAGNOSIS — E782 Mixed hyperlipidemia: Secondary | ICD-10-CM

## 2023-10-11 DIAGNOSIS — M2669 Other specified disorders of temporomandibular joint: Secondary | ICD-10-CM | POA: Diagnosis not present

## 2023-10-11 DIAGNOSIS — J029 Acute pharyngitis, unspecified: Secondary | ICD-10-CM | POA: Diagnosis not present

## 2023-10-15 ENCOUNTER — Encounter: Payer: Self-pay | Admitting: Nurse Practitioner

## 2023-10-15 ENCOUNTER — Ambulatory Visit: Payer: Medicare PPO | Admitting: Nurse Practitioner

## 2023-10-15 VITALS — BP 124/78 | HR 68 | Wt 136.0 lb

## 2023-10-15 DIAGNOSIS — C8104 Nodular lymphocyte predominant Hodgkin lymphoma, lymph nodes of axilla and upper limb: Secondary | ICD-10-CM

## 2023-10-15 DIAGNOSIS — E1159 Type 2 diabetes mellitus with other circulatory complications: Secondary | ICD-10-CM

## 2023-10-15 DIAGNOSIS — M26609 Unspecified temporomandibular joint disorder, unspecified side: Secondary | ICD-10-CM

## 2023-10-15 DIAGNOSIS — E1169 Type 2 diabetes mellitus with other specified complication: Secondary | ICD-10-CM

## 2023-10-15 DIAGNOSIS — E1165 Type 2 diabetes mellitus with hyperglycemia: Secondary | ICD-10-CM | POA: Diagnosis not present

## 2023-10-15 DIAGNOSIS — E039 Hypothyroidism, unspecified: Secondary | ICD-10-CM | POA: Insufficient documentation

## 2023-10-15 DIAGNOSIS — S0300XA Dislocation of jaw, unspecified side, initial encounter: Secondary | ICD-10-CM | POA: Insufficient documentation

## 2023-10-15 DIAGNOSIS — C8400 Mycosis fungoides, unspecified site: Secondary | ICD-10-CM

## 2023-10-15 DIAGNOSIS — I152 Hypertension secondary to endocrine disorders: Secondary | ICD-10-CM | POA: Diagnosis not present

## 2023-10-15 DIAGNOSIS — E782 Mixed hyperlipidemia: Secondary | ICD-10-CM | POA: Diagnosis not present

## 2023-10-15 DIAGNOSIS — M810 Age-related osteoporosis without current pathological fracture: Secondary | ICD-10-CM | POA: Diagnosis not present

## 2023-10-15 MED ORDER — LANCETS MISC. MISC: 99 refills | Status: AC

## 2023-10-15 MED ORDER — ONETOUCH ULTRA 2 W/DEVICE KIT: PACK | 0 refills | Status: AC

## 2023-10-15 MED ORDER — LANCET DEVICE MISC: 99 refills | Status: AC

## 2023-10-15 MED ORDER — BLOOD GLUCOSE TEST VI STRP: ORAL_STRIP | 99 refills | Status: AC

## 2023-10-15 MED ORDER — SIMVASTATIN 20 MG PO TABS: 20.0000 mg | ORAL_TABLET | Freq: Every day | ORAL | 3 refills | Status: DC

## 2023-10-15 NOTE — Progress Notes (Signed)
 Katherine Clamp, DNP, AGNP-c [facility]  [address] [place], [place] [address] [phone]  ESTABLISHED PATIENT- Chronic Health and/or Follow-Up Visit  Blood pressure 124/78, pulse 68, weight 136 lb (61.7 kg).    Katherine Barnett is a 83 y.o. year old female presenting today for evaluation and management of chronic conditions.   History of Present Illness Katherine Barnett "Katherine Barnett" is a 83 year old female who presents with jaw pain and difficulty swallowing. She was referred by a dentist for evaluation by an ear, nose, and throat specialist.  She has been experiencing severe jaw pain and difficulty swallowing, which began recently. The pain is severe, making it difficult to open her mouth, swallow, or talk. She was seen by ENT and is preparing to see a TMJ specialist (dentist).   She is currently taking Advil 600 mg twice daily and consuming soft foods to minimize jaw movement. Her condition has improved, but she continues to experience some discomfort. No recent trauma or consumption of hard foods that could have triggered the symptoms.  No fever, chills, shortness of breath, or chest pain. Her energy levels are good, and she has not experienced any skin issues recently.  All ROS negative with exception of what is listed above.   PHYSICAL EXAM Physical Exam Vitals and nursing note reviewed.  Constitutional:      Appearance: Normal appearance.  HENT:     Head:     Jaw: Tenderness, swelling and pain on movement present.     Right Ear: Tympanic membrane normal.     Left Ear: Tympanic membrane normal.  Eyes:     Pupils: Pupils are equal, round, and reactive to light.  Neck:     Vascular: No carotid bruit.  Cardiovascular:     Rate and Rhythm: Normal rate and regular rhythm.     Pulses: Normal pulses.     Heart sounds: Normal heart sounds.  Pulmonary:     Effort: Pulmonary effort is normal.     Breath sounds: Normal breath sounds.  Abdominal:      General: Bowel sounds are normal.     Palpations: Abdomen is soft.  Lymphadenopathy:     Cervical: No cervical adenopathy.  Skin:    General: Skin is warm and dry.     Capillary Refill: Capillary refill takes less than 2 seconds.  Neurological:     Mental Status: She is alert and oriented to person, place, and time.  Psychiatric:        Mood and Affect: Mood normal.        Behavior: Behavior normal.     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 Labs pending. Will monitor.       Relevant Medications   ibuprofen (ADVIL) 600 MG tablet   Other Relevant Orders   CBC with Differential/Platelet (Completed)   Comprehensive metabolic panel (Completed)   Type 2 diabetes mellitus with hyperglycemia (HCC) - Primary   Diabetes mellitus is present, with difficulties in monitoring due to a malfunctioning glucometer. The potential impact of steroid use on blood sugar levels was discussed, with reassurance that any increase would be temporary and manageable. - Order a new glucometer and test strips covered by insurance - Monitor blood sugar levels regularly once the new glucometer is obtained      Relevant Medications   Blood Glucose Monitoring Suppl (ONE TOUCH  ULTRA 2) w/Device KIT   Glucose Blood (BLOOD GLUCOSE TEST STRIPS) STRP   Lancet Device MISC   Lancets Misc. MISC   simvastatin (ZOCOR) 20 MG tablet   Other Relevant Orders   Hemoglobin A1c (Completed)   CBC with Differential/Platelet (Completed)   Comprehensive metabolic panel (Completed)   Hypertension associated with diabetes (HCC)   Blood pressure was elevated during recent visits, likely due to stress and pain from TMJ disorder. Current readings indicate improvement.      Relevant Medications   simvastatin (ZOCOR) 20 MG tablet   Other Relevant Orders   CBC with Differential/Platelet (Completed)    Comprehensive metabolic panel (Completed)   DM type 2 with diabetic mixed hyperlipidemia (HCC)   Currently managed with simvastatin. Labs pending. No current symptoms.       Relevant Medications   Blood Glucose Monitoring Suppl (ONE TOUCH ULTRA 2) w/Device KIT   Glucose Blood (BLOOD GLUCOSE TEST STRIPS) STRP   Lancet Device MISC   Lancets Misc. MISC   simvastatin (ZOCOR) 20 MG tablet   Other Relevant Orders   Hemoglobin A1c (Completed)   CBC with Differential/Platelet (Completed)   Comprehensive metabolic panel (Completed)   Mycosis fungoides (HCC)   Followed by Dr. Corky Downs with [facility] Currently treated with methotrexate 25mg  every Monday and 1mg  Folic Acid every other day of the week.  Dr. Corky Downs following labs every 2 months.  At this time no concerning findings. Will continue to monitor.       Relevant Medications   ibuprofen (ADVIL) 600 MG tablet   Other Relevant Orders   CBC with Differential/Platelet (Completed)   Osteoporosis   Will monitor vitamin D today. No recent falls or fractures.       Relevant Orders   CBC with Differential/Platelet (Completed)   Comprehensive metabolic panel (Completed)   TSH (Completed)   Acquired hypothyroidism   Managed with levothyroxine . No symptoms.       Relevant Orders   CBC with Differential/Platelet (Completed)   Comprehensive metabolic panel (Completed)   TSH (Completed)   TMJ dysfunction   Severe jaw pain and difficulty speaking are consistent with TMJ. Current treatment has improved symptoms, but some persist. Referral to a TMJ specialist for further evaluation and management is in place. A night guard is suggested to prevent jaw clenching. The use of a low-dose steroid to reduce inflammation was discussed, considering potential short-term effects on blood sugar levels. She prefers to consult with the TMJ specialist before starting steroids. Heat application and soft foods are recommended for symptom management. -  Refer to TMJ specialist for further evaluation and management - Consider prescribing a low-dose steroid if the specialist appointment is delayed - Continue current management with soft foods and avoid jaw movement - Provide prescription for Advil 600 mg, to be taken twice daily      Other Visit Diagnoses       Mixed hyperlipidemia       Relevant Medications   simvastatin (ZOCOR) 20 MG tablet       Return in about 4 months (around 02/14/2024) for Med Management 30.  Katherine Clamp, DNP, AGNP-c

## 2023-10-15 NOTE — Patient Instructions (Addendum)
 I will be happy to send in the steroid (prednisone) for you to start if it will be a while before you are able to be seen by the dentist. You just let me know.   I will be in touch with you with your labs and let you know if we need to change anything.   Temporomandibular Joint Syndrome  Temporomandibular joint syndrome (TMJ syndrome) is a condition that causes pain in the temporomandibular joints. These joints are located near your ears and allow your jaw to open and close. For people with TMJ syndrome, chewing, biting, or other movements of the jaw can be difficult or painful. TMJ syndrome is often mild and goes away within a few weeks. However, sometimes the condition becomes a long-term (chronic) problem. What are the causes? This condition may be caused by: Grinding your teeth or clenching your jaw. Some people do this when they are stressed. Arthritis. An injury to the jaw. A head or neck injury. Teeth or dentures that are not aligned well. In some cases, the cause of TMJ syndrome may not be known. What are the signs or symptoms? The most common symptom of this condition is aching pain on the side of the head in the area of the TMJ. Other symptoms may include: Pain when moving your jaw, such as when chewing or biting. Not being able to open your jaw all the way. Making a clicking sound when you open your mouth. Headache. Earache. Neck or shoulder pain. How is this diagnosed? This condition may be diagnosed based on: Your symptoms and medical history. A physical exam. Your health care provider may check the range of motion of your jaw. Imaging tests, such as X-rays or an MRI. You may also need to see your dentist, who will check if your teeth and jaw are lined up correctly. How is this treated? TMJ syndrome often goes away on its own. If treatment is needed, it may include: Eating soft foods and applying ice or heat. Medicines to relieve pain or inflammation. Medicines or  massage to relax the muscles. A splint, bite plate, or mouthpiece to prevent teeth grinding or jaw clenching. Relaxation techniques or counseling to help reduce stress. A therapy for pain in which an electrical current is applied to the nerves through the skin (transcutaneous electrical nerve stimulation). Acupuncture. This may help to relieve pain. Jaw surgery. This is rarely needed. Follow these instructions at home:  Eating and drinking Eat a soft diet if you are having trouble chewing. Avoid foods that require a lot of chewing. Do not chew gum. General instructions Take over-the-counter and prescription medicines only as told by your health care provider. If directed, put ice on the painful area. To do this: Put ice in a plastic bag. Place a towel between your skin and the bag. Leave the ice on for 20 minutes, 2-3 times a day. Remove the ice if your skin turns bright red. This is very important. If you cannot feel pain, heat, or cold, you have a greater risk of damage to the area. Apply a warm, wet cloth (warm compress) to the painful area as told. Massage your jaw area and do any jaw stretching exercises as told by your health care provider. If you were given a splint, bite plate, or mouthpiece, wear it as told by your health care provider. Keep all follow-up visits. This is important. Where to find more information [employer]: [employer] Contact a health care provider  if: You have trouble eating. You have new or worsening symptoms. Get help right away if: Your jaw locks. Summary Temporomandibular joint syndrome (TMJ syndrome) is a condition that causes pain in the temporomandibular joints. These joints are located near your ears and allow your jaw to open and close. TMJ syndrome is often mild and goes away within a few weeks. However, sometimes the condition becomes a long-term (chronic) problem. Symptoms include an aching pain  on the side of the head in the area of the TMJ, pain when chewing or biting, and being unable to open your jaw all the way. You may also make a clicking sound when you open your mouth. TMJ syndrome often goes away on its own. If treatment is needed, it may include medicines to relieve pain, reduce inflammation, or relax the muscles. A splint, bite plate, or mouthpiece may also be used to prevent teeth grinding or jaw clenching. This information is not intended to replace advice given to you by your health care provider. Make sure you discuss any questions you have with your health care provider. Document Revised: 02/20/2021 Document Reviewed: 02/20/2021 Elsevier Patient Education  2024 [employer].

## 2023-10-16 LAB — CBC WITH DIFFERENTIAL/PLATELET
Basophils Absolute: 0 10*3/uL (ref 0.0–0.2)
Basos: 1 %
EOS (ABSOLUTE): 0.3 10*3/uL (ref 0.0–0.4)
Eos: 9 %
Hematocrit: 38.9 % (ref 34.0–46.6)
Hemoglobin: 12.5 g/dL (ref 11.1–15.9)
Immature Grans (Abs): 0 10*3/uL (ref 0.0–0.1)
Immature Granulocytes: 0 %
Lymphocytes Absolute: 1.2 10*3/uL (ref 0.7–3.1)
Lymphs: 40 %
MCH: 28.5 pg (ref 26.6–33.0)
MCHC: 32.1 g/dL (ref 31.5–35.7)
MCV: 89 fL (ref 79–97)
Monocytes Absolute: 0.3 10*3/uL (ref 0.1–0.9)
Monocytes: 10 %
Neutrophils Absolute: 1.2 10*3/uL — ABNORMAL LOW (ref 1.4–7.0)
Neutrophils: 40 %
Platelets: 225 10*3/uL (ref 150–450)
RBC: 4.39 x10E6/uL (ref 3.77–5.28)
RDW: 12.9 % (ref 11.7–15.4)
WBC: 3.1 10*3/uL — ABNORMAL LOW (ref 3.4–10.8)

## 2023-10-16 LAB — COMPREHENSIVE METABOLIC PANEL
ALT: 20 IU/L (ref 0–32)
AST: 19 IU/L (ref 0–40)
Albumin: 4.5 g/dL (ref 3.7–4.7)
Alkaline Phosphatase: 80 IU/L (ref 44–121)
BUN/Creatinine Ratio: 11 — ABNORMAL LOW (ref 12–28)
BUN: 8 mg/dL (ref 8–27)
Bilirubin Total: 0.3 mg/dL (ref 0.0–1.2)
CO2: 23 mmol/L (ref 20–29)
Calcium: 9.5 mg/dL (ref 8.7–10.3)
Chloride: 105 mmol/L (ref 96–106)
Creatinine, Ser: 0.74 mg/dL (ref 0.57–1.00)
Globulin, Total: 2.2 g/dL (ref 1.5–4.5)
Glucose: 122 mg/dL — ABNORMAL HIGH (ref 70–99)
Potassium: 3.8 mmol/L (ref 3.5–5.2)
Sodium: 140 mmol/L (ref 134–144)
Total Protein: 6.7 g/dL (ref 6.0–8.5)
eGFR: 81 mL/min/{1.73_m2} (ref >59)

## 2023-10-16 LAB — HEMOGLOBIN A1C
Est. average glucose Bld gHb Est-mCnc: 169 mg/dL
Hgb A1c MFr Bld: 7.5 % — ABNORMAL HIGH (ref 4.8–5.6)

## 2023-10-16 LAB — TSH: TSH: 0.211 u[IU]/mL — ABNORMAL LOW (ref 0.450–4.500)

## 2023-10-18 ENCOUNTER — Other Ambulatory Visit (HOSPITAL_COMMUNITY): Payer: Self-pay

## 2023-10-18 ENCOUNTER — Telehealth: Payer: Self-pay | Admitting: Pharmacy Technician

## 2023-10-18 DIAGNOSIS — C8400 Mycosis fungoides, unspecified site: Secondary | ICD-10-CM | POA: Diagnosis not present

## 2023-10-18 NOTE — Telephone Encounter (Signed)
 Pharmacy Patient Advocate Encounter   Received notification from CoverMyMeds that prior authorization for OneTouch Ultra 2 w/Device kit is required/requested.   Insurance verification completed.   The patient is insured through [employer] .   Per test claim:  [employer] is preferred by the insurance.  If suggested medication is appropriate, Please send in a new RX and discontinue this one. If not, please advise as to why it's not appropriate so that we may request a Prior Authorization. Please note, some preferred medications may still require a PA.  If the suggested medications have not been trialed and there are no contraindications to their use, the PA will not be submitted, as it will not be approved.

## 2023-10-19 ENCOUNTER — Other Ambulatory Visit: Payer: Self-pay

## 2023-10-19 MED ORDER — BLOOD GLUCOSE MONITORING SUPPL DEVI: 1.0000 | Freq: Three times a day (TID) | 0 refills | Status: AC

## 2023-10-21 NOTE — Assessment & Plan Note (Signed)
 Blood pressure was elevated during recent visits, likely due to stress and pain from TMJ disorder. Current readings indicate improvement.

## 2023-10-21 NOTE — Assessment & Plan Note (Signed)
 Severe jaw pain and difficulty speaking are consistent with TMJ. Current treatment has improved symptoms, but some persist. Referral to a TMJ specialist for further evaluation and management is in place. A night guard is suggested to prevent jaw clenching. The use of a low-dose steroid to reduce inflammation was discussed, considering potential short-term effects on blood sugar levels. She prefers to consult with the TMJ specialist before starting steroids. Heat application and soft foods are recommended for symptom management. - Refer to TMJ specialist for further evaluation and management - Consider prescribing a low-dose steroid if the specialist appointment is delayed - Continue current management with soft foods and avoid jaw movement - Provide prescription for Advil 600 mg, to be taken twice daily

## 2023-10-21 NOTE — Assessment & Plan Note (Signed)
 Managed with levothyroxine . No symptoms.

## 2023-10-21 NOTE — Assessment & Plan Note (Signed)
Will monitor vitamin D today. No recent falls or fractures.

## 2023-10-21 NOTE — Assessment & Plan Note (Signed)
 Currently managed with simvastatin. Labs pending. No current symptoms.

## 2023-10-21 NOTE — Assessment & Plan Note (Signed)
 No symptoms at this time. Labs pending. Will monitor.

## 2023-10-21 NOTE — Assessment & Plan Note (Signed)
Followed by Dr. Irish Elders with [facility] Currently treated with methotrexate 25mg  every Monday and 1mg  Folic Acid every other day of the week.  Dr. Irish Elders following labs every 2 months.  At this time no concerning findings. Will continue to monitor.

## 2023-10-21 NOTE — Assessment & Plan Note (Signed)
 Diabetes mellitus is present, with difficulties in monitoring due to a malfunctioning glucometer. The potential impact of steroid use on blood sugar levels was discussed, with reassurance that any increase would be temporary and manageable. - Order a new glucometer and test strips covered by insurance - Monitor blood sugar levels regularly once the new glucometer is obtained

## 2023-10-31 ENCOUNTER — Other Ambulatory Visit: Payer: Self-pay | Admitting: Nurse Practitioner

## 2023-10-31 DIAGNOSIS — E039 Hypothyroidism, unspecified: Secondary | ICD-10-CM

## 2023-10-31 MED ORDER — LEVOTHYROXINE SODIUM 50 MCG PO TABS: ORAL_TABLET | ORAL | 1 refills | Status: DC

## 2023-11-21 DIAGNOSIS — Z79899 Other long term (current) drug therapy: Secondary | ICD-10-CM | POA: Diagnosis not present

## 2023-11-21 DIAGNOSIS — C8400 Mycosis fungoides, unspecified site: Secondary | ICD-10-CM | POA: Diagnosis not present

## 2023-12-31 ENCOUNTER — Encounter: Payer: Self-pay | Admitting: Nurse Practitioner

## 2023-12-31 ENCOUNTER — Telehealth: Payer: Self-pay

## 2023-12-31 DIAGNOSIS — Z1231 Encounter for screening mammogram for malignant neoplasm of breast: Secondary | ICD-10-CM

## 2023-12-31 NOTE — Telephone Encounter (Signed)
 Copied from CRM [id number]. Topic: General - Other >> Dec 31, 2023 10:20 AM Katherine Barnett wrote: Reason for CRM: Patient called in stated she needs an diagnostic mammogram and was told she needs to get this information from her pcp

## 2024-01-01 NOTE — Telephone Encounter (Signed)
 Mammogram orders place

## 2024-01-01 NOTE — Addendum Note (Signed)
 Addended by: Salem Lembke, Abraham Hoffmann E on: 01/01/2024 05:35 PM   Modules accepted: Orders

## 2024-01-02 DIAGNOSIS — C8400 Mycosis fungoides, unspecified site: Secondary | ICD-10-CM | POA: Diagnosis not present

## 2024-01-02 DIAGNOSIS — Z79899 Other long term (current) drug therapy: Secondary | ICD-10-CM | POA: Diagnosis not present

## 2024-01-03 ENCOUNTER — Other Ambulatory Visit: Payer: Self-pay

## 2024-01-26 ENCOUNTER — Other Ambulatory Visit: Payer: Self-pay | Admitting: Nurse Practitioner

## 2024-01-26 DIAGNOSIS — E039 Hypothyroidism, unspecified: Secondary | ICD-10-CM

## 2024-01-28 ENCOUNTER — Telehealth: Payer: Self-pay

## 2024-01-28 NOTE — Telephone Encounter (Signed)
 Do you have an order for diagnostic mammogram for this pt.?  Copied from CRM [id number]. Topic: General - Other >> Jan 28, 2024  9:23 AM Travis F wrote: Reason for CRM: Patient is calling in regarding a diagnostic mammogram. Patient says the breast center has not received the paperwork for the mammogram. Patient is requesting the paperwork be sent over to the breast center.

## 2024-01-28 NOTE — Telephone Encounter (Signed)
 Different script in her chart take 5 days a week is she supposed to continue 5 days a week or once daily? Pt. Has appt. Coming up at the end of the month.

## 2024-02-10 ENCOUNTER — Other Ambulatory Visit: Payer: Self-pay | Admitting: Nurse Practitioner

## 2024-02-19 ENCOUNTER — Ambulatory Visit: Admitting: Nurse Practitioner

## 2024-02-19 ENCOUNTER — Encounter: Payer: Self-pay | Admitting: Nurse Practitioner

## 2024-02-19 VITALS — BP 122/78 | HR 84 | Wt 136.2 lb

## 2024-02-19 DIAGNOSIS — E782 Mixed hyperlipidemia: Secondary | ICD-10-CM | POA: Diagnosis not present

## 2024-02-19 DIAGNOSIS — E1165 Type 2 diabetes mellitus with hyperglycemia: Secondary | ICD-10-CM | POA: Diagnosis not present

## 2024-02-19 DIAGNOSIS — I152 Hypertension secondary to endocrine disorders: Secondary | ICD-10-CM

## 2024-02-19 DIAGNOSIS — E1169 Type 2 diabetes mellitus with other specified complication: Secondary | ICD-10-CM | POA: Diagnosis not present

## 2024-02-19 DIAGNOSIS — E1159 Type 2 diabetes mellitus with other circulatory complications: Secondary | ICD-10-CM

## 2024-02-19 DIAGNOSIS — E039 Hypothyroidism, unspecified: Secondary | ICD-10-CM

## 2024-02-19 MED ORDER — SIMVASTATIN 20 MG PO TABS: 20.0000 mg | ORAL_TABLET | Freq: Every day | ORAL | Status: DC

## 2024-02-19 NOTE — Assessment & Plan Note (Addendum)
 Blood sugar levels generally between 140 and 160 mg/dL, with occasional readings as low as 103 mg/dL, not clear if these are fasting or not. Increased awareness of dietary influences and engaging in regular physical activity. We discussed the importance of diet and exercise for management. She can hold off on taking her blood sugar on a routine basis, as this is not really giving us  a clear picture of control. I recommend taking her blood sugar if she is feeling poorly.  - Continue current diabetes management plan. Taking simvastatin  nightly - we discussed it is ok to take this at bedtime as she has been.  - Encourage moderation in dietary intake, particularly sweets and carbohydrates. - Advise to check blood sugars only if feeling unwell.

## 2024-02-19 NOTE — Assessment & Plan Note (Signed)
 Blood sugar levels generally between 140 and 160 mg/dL, with occasional readings as low as 103 mg/dL, not clear if these are fasting or not. Increased awareness of dietary influences and engaging in regular physical activity. We discussed the importance of diet and exercise for management. She can hold off on taking her blood sugar on a routine basis, as this is not really giving us  a clear picture of control. I recommend taking her blood sugar if she is feeling poorly.  - Continue current diabetes management plan. Taking simvastatin  nightly - we discussed it is ok to take this at bedtime as she has been.  - Encourage moderation in dietary intake, particularly sweets and carbohydrates. - Advise to check blood sugars only if feeling unwell.

## 2024-02-19 NOTE — Patient Instructions (Addendum)
 You look fabulous!!   You do not have to take your blood sugar at home unless you are having symptoms or feel off. We will just keep an eye on your average blood sugars and make any changes based on that.   Your blood pressure is GREAT!! Keep up the good work!  For now, take your levothyroxine  5 days a week and skip 2 days. If we need to change this based on your labs, I will let you know.   You can continue to take your simvastatin  at bedtime.   Check on your eye exam when you have the chance.   Enjoy your time with your nephew!!

## 2024-02-19 NOTE — Progress Notes (Signed)
 Katherine Doing, DNP, AGNP-c [facility]  [address] [place], [place] [address] [phone]  ESTABLISHED PATIENT- Chronic Health and/or Follow-Up Visit  Blood pressure 122/78, pulse 84, weight 136 lb 3.2 oz (61.8 kg).    History of Present Illness Katherine Barnett Katherine Barnett is an 83 year old female with diabetes who presents for medication management and routine follow-up.  She is experiencing confusion regarding her medication regimen, particularly the timing and dosage. Her statin medication timing was changed on her prescription bottle from bedtime to 6 PM, causing concern about its proximity to her metformin, which she takes after dinner between 7 and 7:30 PM. She also wanted to ensure she was taking her thyroid medication correctly, five days a week and skip it for two days.  Her blood sugar levels typically range between 140 and 160 mg/dL, with a one-time reading of 103 mg/dL. She has not been checking her blood sugar routinely or always with fasting. She is more conscious of her diet, avoiding sodas, pies, and ice cream, and is aware that carbohydrates like pasta and bread can affect her blood sugar. She notes an increased taste for sweets with age.   She denies thyroid symptoms, chest pain, palpitations, abdominal tenderness, foot swelling, or numbness and tingling in her feet. She engages in physical activity by walking with her nephew at 6 AM to avoid the heat, which she finds beneficial.  She is scheduled for a breast exam and a dermatology appointment next week and is awaiting contact from her eye care provider for her annual eye exam.  All ROS negative with exception of what is listed above.   PHYSICAL EXAM Physical Exam Vitals and nursing note reviewed.  Constitutional:      General: She is not in acute distress.    Appearance: Normal appearance.  HENT:     Head: Normocephalic.  Eyes:     Conjunctiva/sclera: Conjunctivae normal.  Neck:      Vascular: No carotid bruit.  Cardiovascular:     Rate and Rhythm: Normal rate and regular rhythm.     Pulses: Normal pulses.     Heart sounds: Normal heart sounds. No murmur heard. Pulmonary:     Effort: Pulmonary effort is normal.     Breath sounds: Normal breath sounds.  Musculoskeletal:     Cervical back: No tenderness.     Right lower leg: No edema.     Left lower leg: No edema.  Lymphadenopathy:     Cervical: No cervical adenopathy.  Skin:    General: Skin is warm and dry.     Capillary Refill: Capillary refill takes less than 2 seconds.  Neurological:     General: No focal deficit present.     Mental Status: She is alert and oriented to person, place, and time.     Motor: No weakness.  Psychiatric:        Mood and Affect: Mood normal.      PLAN Problem List Items Addressed This Visit     Type 2 diabetes mellitus with hyperglycemia (HCC)   Blood sugar levels generally between 140 and 160 mg/dL, with occasional readings as low as 103 mg/dL, not clear if these are fasting or not. Increased awareness of dietary influences and engaging in regular physical activity. We discussed the importance of diet and exercise for management. She can hold off on taking her blood sugar on a routine basis, as this is not really giving us  a clear picture of control. I recommend taking her blood  sugar if she is feeling poorly.  - Continue current diabetes management plan. Taking simvastatin  nightly - we discussed it is ok to take this at bedtime as she has been.  - Encourage moderation in dietary intake, particularly sweets and carbohydrates. - Advise to check blood sugars only if feeling unwell.      Relevant Medications   simvastatin  (ZOCOR ) 20 MG tablet   Other Relevant Orders   CBC with Differential/Platelet   CMP14+EGFR   Hemoglobin A1c   Hypertension associated with diabetes (HCC)   Excellent blood pressure control today with losartan . No concerning symptoms are present. Recommend  continue current regimen. Labs for kidneys, electrolytes pending.       Relevant Medications   simvastatin  (ZOCOR ) 20 MG tablet   Other Relevant Orders   CBC with Differential/Platelet   CMP14+EGFR   Hemoglobin A1c   DM type 2 with diabetic mixed hyperlipidemia (HCC) - Primary   Blood sugar levels generally between 140 and 160 mg/dL, with occasional readings as low as 103 mg/dL, not clear if these are fasting or not. Increased awareness of dietary influences and engaging in regular physical activity. We discussed the importance of diet and exercise for management. She can hold off on taking her blood sugar on a routine basis, as this is not really giving us  a clear picture of control. I recommend taking her blood sugar if she is feeling poorly.  - Continue current diabetes management plan. Taking simvastatin  nightly - we discussed it is ok to take this at bedtime as she has been.  - Encourage moderation in dietary intake, particularly sweets and carbohydrates. - Advise to check blood sugars only if feeling unwell.      Relevant Medications   simvastatin  (ZOCOR ) 20 MG tablet   Other Relevant Orders   CBC with Differential/Platelet   CMP14+EGFR   Hemoglobin A1c   Acquired hypothyroidism   Managed with levothyroxine , taken five days a week with two days off. No symptoms of hypothyroidism. Previous confusion about dosing schedule clarified- she has been taking the medication correctly. No changes needed at this time.  - Continue current levothyroxine  regimen of five days on and two days off. - If thyroid levels are abnormal in future labs, consider adjusting the levothyroxine  dose and provide clear instructions.      Relevant Orders   TSH    Return in about 3 months (around 05/21/2024) for Med Management 30.  Katherine Doing, DNP, AGNP-c

## 2024-02-19 NOTE — Assessment & Plan Note (Signed)
 Managed with levothyroxine , taken five days a week with two days off. No symptoms of hypothyroidism. Previous confusion about dosing schedule clarified- she has been taking the medication correctly. No changes needed at this time.  - Continue current levothyroxine  regimen of five days on and two days off. - If thyroid levels are abnormal in future labs, consider adjusting the levothyroxine  dose and provide clear instructions.

## 2024-02-19 NOTE — Assessment & Plan Note (Signed)
 Excellent blood pressure control today with losartan . No concerning symptoms are present. Recommend continue current regimen. Labs for kidneys, electrolytes pending.

## 2024-02-20 LAB — CMP14+EGFR
ALT: 21 IU/L (ref 0–32)
AST: 19 IU/L (ref 0–40)
Albumin: 4.6 g/dL (ref 3.7–4.7)
Alkaline Phosphatase: 93 IU/L (ref 44–121)
BUN/Creatinine Ratio: 21 (ref 12–28)
BUN: 15 mg/dL (ref 8–27)
Bilirubin Total: 0.3 mg/dL (ref 0.0–1.2)
CO2: 21 mmol/L (ref 20–29)
Calcium: 9.9 mg/dL (ref 8.7–10.3)
Chloride: 104 mmol/L (ref 96–106)
Creatinine, Ser: 0.73 mg/dL (ref 0.57–1.00)
Globulin, Total: 2.4 g/dL (ref 1.5–4.5)
Glucose: 141 mg/dL — AB (ref 70–99)
Potassium: 4.3 mmol/L (ref 3.5–5.2)
Sodium: 142 mmol/L (ref 134–144)
Total Protein: 7 g/dL (ref 6.0–8.5)
eGFR: 83 mL/min/1.73 (ref >59)

## 2024-02-20 LAB — CBC WITH DIFFERENTIAL/PLATELET
Basophils Absolute: 0 x10E3/uL (ref 0.0–0.2)
Basos: 1 %
EOS (ABSOLUTE): 0.6 x10E3/uL — ABNORMAL HIGH (ref 0.0–0.4)
Eos: 15 %
Hematocrit: 41.7 % (ref 34.0–46.6)
Hemoglobin: 12.8 g/dL (ref 11.1–15.9)
Immature Grans (Abs): 0 x10E3/uL (ref 0.0–0.1)
Immature Granulocytes: 0 %
Lymphocytes Absolute: 1.3 x10E3/uL (ref 0.7–3.1)
Lymphs: 35 %
MCH: 27.8 pg (ref 26.6–33.0)
MCHC: 30.7 g/dL — ABNORMAL LOW (ref 31.5–35.7)
MCV: 91 fL (ref 79–97)
Monocytes Absolute: 0.3 x10E3/uL (ref 0.1–0.9)
Monocytes: 9 %
Neutrophils Absolute: 1.5 x10E3/uL (ref 1.4–7.0)
Neutrophils: 40 %
Platelets: 226 x10E3/uL (ref 150–450)
RBC: 4.6 x10E6/uL (ref 3.77–5.28)
RDW: 13.1 % (ref 11.7–15.4)
WBC: 3.7 x10E3/uL (ref 3.4–10.8)

## 2024-02-20 LAB — HEMOGLOBIN A1C
Est. average glucose Bld gHb Est-mCnc: 166 mg/dL
Hgb A1c MFr Bld: 7.4 % — ABNORMAL HIGH (ref 4.8–5.6)

## 2024-02-20 LAB — TSH: TSH: 0.338 u[IU]/mL — AB (ref 0.450–4.500)

## 2024-02-25 ENCOUNTER — Ambulatory Visit: Payer: Self-pay | Admitting: Nurse Practitioner

## 2024-02-26 DIAGNOSIS — Z79899 Other long term (current) drug therapy: Secondary | ICD-10-CM | POA: Diagnosis not present

## 2024-02-26 DIAGNOSIS — E7849 Other hyperlipidemia: Secondary | ICD-10-CM | POA: Diagnosis not present

## 2024-02-26 DIAGNOSIS — C8400 Mycosis fungoides, unspecified site: Secondary | ICD-10-CM | POA: Diagnosis not present

## 2024-02-27 ENCOUNTER — Ambulatory Visit
Admission: RE | Admit: 2024-02-27 | Discharge: 2024-02-27 | Disposition: A | Discharge status: Home / Self Care | Source: Ambulatory Visit | Attending: Nurse Practitioner

## 2024-02-27 DIAGNOSIS — Z1231 Encounter for screening mammogram for malignant neoplasm of breast: Secondary | ICD-10-CM

## 2024-03-04 ENCOUNTER — Ambulatory Visit: Payer: Self-pay | Admitting: Nurse Practitioner

## 2024-04-08 DIAGNOSIS — E119 Type 2 diabetes mellitus without complications: Secondary | ICD-10-CM | POA: Diagnosis not present

## 2024-04-09 ENCOUNTER — Other Ambulatory Visit

## 2024-04-09 DIAGNOSIS — E039 Hypothyroidism, unspecified: Secondary | ICD-10-CM | POA: Diagnosis not present

## 2024-04-10 LAB — TSH: TSH: 1.35 u[IU]/mL (ref 0.450–4.500)

## 2024-04-15 DIAGNOSIS — E7849 Other hyperlipidemia: Secondary | ICD-10-CM | POA: Diagnosis not present

## 2024-04-15 DIAGNOSIS — C8400 Mycosis fungoides, unspecified site: Secondary | ICD-10-CM | POA: Diagnosis not present

## 2024-04-15 DIAGNOSIS — Z79899 Other long term (current) drug therapy: Secondary | ICD-10-CM | POA: Diagnosis not present

## 2024-04-18 ENCOUNTER — Ambulatory Visit: Payer: Self-pay | Admitting: Nurse Practitioner

## 2024-04-30 ENCOUNTER — Telehealth: Payer: Self-pay | Admitting: Nurse Practitioner

## 2024-04-30 NOTE — Telephone Encounter (Unsigned)
 Copied from CRM [id number]. Topic: Clinical - Medication Question >> Apr 30, 2024 10:56 AM Donee H wrote: Reason for CRM: Patient called to request to speak directly to either Camie Doing or her nurse regarding medication levothyroxine  (SYNTHROID ) 50 MCG tablet. Patient states she needs clarification on this medication and what exactly she needs to be doing.  She stated she is getting different instructions. [phone]

## 2024-04-30 NOTE — Telephone Encounter (Signed)
 I was able to review the note from Katherine Barnett and view her lab results on [facility]. It appears her TSH and T4 are not showing expected results.   Please let her know that she can increase her levothyroxine  back to 50mcg based on the additional labs completed by Barnett.   We will recheck both TSH and T4 with her next lab draw given the noted discrepancy.

## 2024-05-27 ENCOUNTER — Ambulatory Visit: Payer: Self-pay | Admitting: Nurse Practitioner

## 2024-05-27 ENCOUNTER — Encounter: Payer: Self-pay | Admitting: Nurse Practitioner

## 2024-05-27 VITALS — BP 124/84 | HR 68 | Ht 59.0 in | Wt 139.0 lb

## 2024-05-27 DIAGNOSIS — E1169 Type 2 diabetes mellitus with other specified complication: Secondary | ICD-10-CM

## 2024-05-27 DIAGNOSIS — E1159 Type 2 diabetes mellitus with other circulatory complications: Secondary | ICD-10-CM | POA: Diagnosis not present

## 2024-05-27 DIAGNOSIS — Z7984 Long term (current) use of oral hypoglycemic drugs: Secondary | ICD-10-CM

## 2024-05-27 DIAGNOSIS — E782 Mixed hyperlipidemia: Secondary | ICD-10-CM | POA: Diagnosis not present

## 2024-05-27 DIAGNOSIS — C8104 Nodular lymphocyte predominant Hodgkin lymphoma, lymph nodes of axilla and upper limb: Secondary | ICD-10-CM | POA: Diagnosis not present

## 2024-05-27 DIAGNOSIS — C8408 Mycosis fungoides, lymph nodes of multiple sites: Secondary | ICD-10-CM | POA: Diagnosis not present

## 2024-05-27 DIAGNOSIS — Z0000 Encounter for general adult medical examination without abnormal findings: Secondary | ICD-10-CM | POA: Diagnosis not present

## 2024-05-27 DIAGNOSIS — I152 Hypertension secondary to endocrine disorders: Secondary | ICD-10-CM

## 2024-05-27 DIAGNOSIS — E039 Hypothyroidism, unspecified: Secondary | ICD-10-CM | POA: Diagnosis not present

## 2024-05-27 DIAGNOSIS — B001 Herpesviral vesicular dermatitis: Secondary | ICD-10-CM

## 2024-05-27 LAB — LIPID PANEL

## 2024-05-27 MED ORDER — METFORMIN HCL ER 500 MG PO TB24: 500.0000 mg | ORAL_TABLET | Freq: Every day | ORAL | Status: DC

## 2024-05-27 MED ORDER — LOSARTAN POTASSIUM 25 MG PO TABS: 25.0000 mg | ORAL_TABLET | Freq: Every day | ORAL | 1 refills | Status: AC

## 2024-05-27 MED ORDER — SIMVASTATIN 20 MG PO TABS
20.0000 mg | ORAL_TABLET | Freq: Every day | ORAL | 1 refills | Status: DC
Start: 2024-05-27 — End: 2024-06-02

## 2024-05-27 MED ORDER — BEXAROTENE 75 MG PO CAPS: 75.0000 mg | ORAL_CAPSULE | Freq: Every day | ORAL | Status: AC

## 2024-05-27 MED ORDER — LEVOTHYROXINE SODIUM 50 MCG PO TABS: ORAL_TABLET | ORAL | Status: AC

## 2024-05-27 NOTE — Patient Instructions (Signed)
 For all adult patients, I recommend A well balanced diet low in saturated fats, cholesterol, and moderation in carbohydrates.   This can be as simple as monitoring portion sizes and cutting back on sugary beverages such as soda and juice to start with.    Daily water consumption of at least 64 ounces.  Physical activity at least 180 minutes per week, if just starting out.   This can be as simple as taking the stairs instead of the elevator and walking 2-3 laps around the office  purposefully every day.   STD protection, partner selection, and regular testing if high risk.  Limited consumption of alcoholic beverages if alcohol is consumed.  For women, I recommend no more than 7 alcoholic beverages per week, spread out throughout the week.  Avoid binge drinking or consuming large quantities of alcohol in one setting.   Please let me know if you feel you may need help with reduction or quitting alcohol consumption.   Avoidance of nicotine, if used.  Please let me know if you feel you may need help with reduction or quitting nicotine use.   Daily mental health attention.  This can be in the form of 5 minute daily meditation, prayer, journaling, yoga, reflection, etc.   Purposeful attention to your emotions and mental state can significantly improve your overall wellbeing  and  Health.  Please know that I am here to help you with all of your health care goals and am happy to work with you to find a solution that works best for you.  The greatest advice I have received with any changes in life are to take it one step at a time, that even means if all you can focus on is the next 60 seconds, then do that and celebrate your victories.  With any changes in life, you will have set backs, and that is OK. The important thing to remember is, if you have a set back, it is not a failure, it is an opportunity to try again!  Health Maintenance Recommendations Screening Testing Mammogram Every 1 -2  years based on history and risk factors Starting at age 15 Pap Smear Ages 21-39 every 3 years Ages 9-65 every 5 years with HPV testing More frequent testing may be required based on results and history Colon Cancer Screening Every 1-10 years based on test performed, risk factors, and history Starting at age 56 Bone Density Screening Every 2-10 years based on history Starting at age 55 for women Recommendations for men differ based on medication usage, history, and risk factors AAA Screening One time ultrasound Men 102-69 years old who have every smoked Lung Cancer Screening Low Dose Lung CT every 12 months Age 76-80 years with a 30 pack-year smoking history who still smoke or who have quit within the last 15 years  Screening Labs Routine  Labs: Complete Blood Count (CBC), Complete Metabolic Panel (CMP), Cholesterol (Lipid Panel) Every 6-12 months based on history and medications May be recommended more frequently based on current conditions or previous results Hemoglobin A1c Lab Every 3-12 months based on history and previous results Starting at age 11 or earlier with diagnosis of diabetes, high cholesterol, BMI >26, and/or risk factors Frequent monitoring for patients with diabetes to ensure blood sugar control Thyroid Panel (TSH w/ T3 & T4) Every 6 months based on history, symptoms, and risk factors May be repeated more often if on medication HIV One time testing for all patients 85 and older May be  repeated more frequently for patients with increased risk factors or exposure Hepatitis C One time testing for all patients 18 and older May be repeated more frequently for patients with increased risk factors or exposure Gonorrhea, Chlamydia Every 12 months for all sexually active persons 13-24 years Additional monitoring may be recommended for those who are considered high risk or who have symptoms PSA Men 60-66 years old with risk factors Additional screening may be  recommended from age 30-69 based on risk factors, symptoms, and history  Vaccine Recommendations Tetanus Booster All adults every 10 years Flu Vaccine All patients 6 months and older every year COVID Vaccine All patients 12 years and older Initial dosing with booster May recommend additional booster based on age and health history HPV Vaccine 2 doses all patients age 29-26 Dosing may be considered for patients over 26 Shingles Vaccine (Shingrix) 2 doses all adults 55 years and older Pneumonia (Pneumovax 23) All adults 65 years and older May recommend earlier dosing based on health history Pneumonia (Prevnar 12) All adults 65 years and older Dosed 1 year after Pneumovax 23  Additional Screening, Testing, and Vaccinations may be recommended on an individualized basis based on family history, health history, risk factors, and/or exposure.

## 2024-05-27 NOTE — Assessment & Plan Note (Signed)
 Chronic. She has been on levothyroxine  50mcg once a day. Dr. Flynn with [facility] helps to manage this. We will send her labs to Dr. Flynn once the results come back.

## 2024-05-27 NOTE — Progress Notes (Signed)
 Katherine Doing, DNP, AGNP-c [facility] [address] [place], [place] [address] Main Office [phone] VISIT TYPE: CPE on 05/27/2024 Today's Vitals   05/27/24 1119  BP: 124/84  Pulse: 68  Weight: 139 lb (63 kg)  Height: 4' 11 (1.499 m)   Body mass index is 28.07 kg/m. BP 124/84   Pulse 68   Ht 4' 11 (1.499 m)   Wt 139 lb (63 kg)   BMI 28.07 kg/m   Subjective:    Patient ID: Katherine Barnett, female    DOB: 16-Apr-1941, 83 y.o.   MRN: [id number]  HPI: History of Present Illness Katherine Barnett is an 83 year old female with type 2 diabetes, hypertension, hyperlipidemia, and hypothyroidism who presents for a routine follow-up. We converted this visit to her annual physical exam after seeing that she is scheduled back in just 2 weeks for that.   She experiences confusion regarding her medication management, particularly with levothyroxine , which she takes one tablet daily following a previous dosage adjustment. She plans to have her blood work done at [facility] before her next visit.  She has a history of Hodgkin's lymphoma and mycosis fungoides, for which she is followed by Dr. Chandra at [facility]. She was previously on methotrexate but is no longer taking it. She currently uses triamcinolone cream for dark spots, applying it for two weeks on and one week off. She finds it challenging to apply the cream to her back due to physical limitations and requires assistance from her sister-in-law.  For hypertension, she takes losartan  25 mg once daily. She does not report any issues with her blood pressure medication.  Regarding diabetes, she takes metformin once daily after experiencing nausea with higher doses. She monitors her blood sugar at home, noting readings between 140-170 mg/dL.  She is on simvastatin  20 mg daily for hyperlipidemia and reports no muscle pain or cramping.  She mentions a prosthesis in her arm, which limits her ability  to apply medication to her back. She relies on her sister-in-law for assistance, although she is hesitant to ask for help frequently.  No lymph node enlargement, changes in bowel or bladder habits, vision or hearing concerns, and she reports a good appetite. She acknowledges a lack of exercise since her nephew's visit in the summer.  Pertinent items are noted in HPI. urine microalbumin ordered Most Recent Depression Screen:     02/19/2024   10:11 AM 10/15/2023   10:25 AM 06/12/2023    1:42 PM 05/25/2022   10:26 AM 11/22/2021    2:33 PM  Depression screen PHQ 2/9  Decreased Interest 0 0 0 0 0  Down, Depressed, Hopeless 0 0 0 0 0  PHQ - 2 Score 0 0 0 0 0  Altered sleeping    1   Tired, decreased energy    0   Change in appetite    0   Feeling bad or failure about yourself     0   Trouble concentrating    0   Moving slowly or fidgety/restless    0   Suicidal thoughts    0   PHQ-9 Score    1    Difficult Barnett work/chores    Not difficult at all      Data saved with a previous flowsheet row definition   Most Recent Anxiety Screen:     05/25/2022   10:27 AM 04/12/2021    5:28 PM  GAD 7 : Generalized Anxiety Score  Nervous, Anxious, on  Edge 0 0  Control/stop worrying 0 0  Worry too much - different things 0 0  Trouble relaxing 0 0  Restless 0 0  Easily annoyed or irritable 0 0  Afraid - awful might happen 0 0  Total GAD 7 Score 0 0  Anxiety Difficulty Not difficult at all    Most Recent Fall Screen:    02/19/2024   10:11 AM 10/15/2023   10:25 AM 06/12/2023    1:42 PM 05/25/2022   10:26 AM 11/22/2021    2:32 PM  Fall Risk   Falls in the past year? 0 0 0 0 0  Number falls in past yr: 0 0 0 0 0  Injury with Fall? 0 0 0 0 0  Risk for fall due to : No Fall Risks No Fall Risks No Fall Risks No Fall Risks No Fall Risks  Follow up Falls evaluation completed Falls evaluation completed Falls evaluation completed Falls evaluation completed;Education provided  Falls evaluation  completed;Education provided      Data saved with a previous flowsheet row definition    Past medical history, surgical history, medications, allergies, family history and social history reviewed with patient today and changes made to appropriate areas of the chart.  Past Medical History:  Past Medical History:  Diagnosis Date   Complication of anesthesia    vomited after colonoscopy   Diabetes mellitus without complication (HCC)    type 2 - diet controlled   Hodgkin's lymphoma (HCC)    Hypertension    Melanoma (HCC)    Multinodular goiter (nontoxic)    Mycosis fungoides (HCC)    Osteoporosis    Pseudophakia of left eye    Right supracondylar humerus fracture; right radial head/neck fracture 12/22/2014   Medications:  Current Outpatient Medications on File Prior to Visit  Medication Sig   aspirin  81 MG tablet Take 81 mg by mouth daily.   Blood Glucose Monitoring Suppl (ONE TOUCH ULTRA 2) w/Device KIT Use as directed   Blood Glucose Monitoring Suppl DEVI 1 each by Does not apply route in the morning, at noon, and at bedtime. May substitute to any manufacturer covered by patient's insurance. Please use Accu Chek device this is preferred by the pt ins   calcium-vitamin D  (OSCAL WITH D) 500-200 MG-UNIT per tablet Take 1 tablet by mouth daily with breakfast.   Glucose Blood (BLOOD GLUCOSE TEST STRIPS) STRP For monitoring blood sugar levels up to 4 times a day. May substitute to any manufacturer covered by patient's insurance.   Lancet Device MISC For monitoring blood sugar up to 4 times a day. May substitute to any manufacturer covered by patient's insurance.   Lancets Misc. MISC For monitoring blood sugar up to 4 times a day. May substitute to any manufacturer covered by patient's insurance.   triamcinolone ointment (KENALOG) 0.1 % Apply 1 Application topically 2 (two) times daily. For mycosis fungoides. Managed by Dr Flynn   valACYclovir  (VALTREX ) 1000 MG tablet Take 0.5 tablets (500 mg  total) by mouth 2 (two) times daily as needed (fever blisters).   No current facility-administered medications on file prior to visit.   Surgical History:  Past Surgical History:  Procedure Laterality Date   AXILLARY LYMPH NODE BIOPSY Left 03/12/2019   Procedure: EXCISIONAL BIOPSY LEFT AXILLARY LYMPH NODES;  Surgeon: Vernetta Berg, MD;  Location: [facility] OR;  Service: General;  Laterality: Left;   BREAST EXCISIONAL BIOPSY Left    Lt axillary bx, was benign   COLONOSCOPY  10/11/2016  repeat in 10 yrs   EYE SURGERY Bilateral    cataract surgery with lens implant   ORIF HUMERUS FRACTURE Right 12/22/2014   Procedure: OPEN REDUCTION INTERNAL FIXATION (ORIF) RIGHT DISTAL HUMERUS FRACTURE;  Surgeon: Lonni CINDERELLA Poli, MD;  Location: [facility] OR;  Service: Orthopedics;  Laterality: Right;   Allergies:  No Known Allergies Family History:  No family history on file.     Objective:    BP 124/84   Pulse 68   Ht 4' 11 (1.499 m)   Wt 139 lb (63 kg)   BMI 28.07 kg/m   Wt Readings from Last 3 Encounters:  05/27/24 139 lb (63 kg)  02/19/24 136 lb 3.2 oz (61.8 kg)  10/15/23 136 lb (61.7 kg)    Physical Exam Vitals and nursing note reviewed.  Constitutional:      General: She is not in acute distress.    Appearance: Normal appearance. She is normal weight. She is not ill-appearing.  HENT:     Head: Normocephalic and atraumatic.     Right Ear: Hearing, tympanic membrane, ear canal and external ear normal.     Left Ear: Hearing, tympanic membrane, ear canal and external ear normal.     Nose: Nose normal.     Right Sinus: No maxillary sinus tenderness or frontal sinus tenderness.     Left Sinus: No maxillary sinus tenderness or frontal sinus tenderness.     Mouth/Throat:     Lips: Pink.     Mouth: Mucous membranes are moist.     Pharynx: Oropharynx is clear.  Eyes:     General: Lids are normal. Vision grossly intact.     Extraocular Movements: Extraocular movements intact.      Conjunctiva/sclera: Conjunctivae normal.     Pupils: Pupils are equal, round, and reactive to light.     Funduscopic exam:    Right eye: Red reflex present.        Left eye: Red reflex present.    Visual Fields: Right eye visual fields normal and left eye visual fields normal.  Neck:     Thyroid: No thyromegaly.     Vascular: No carotid bruit.  Cardiovascular:     Rate and Rhythm: Normal rate and regular rhythm.     Chest Wall: PMI is not displaced.     Pulses: Normal pulses.          Dorsalis pedis pulses are 2+ on the right side and 2+ on the left side.       Posterior tibial pulses are 2+ on the right side and 2+ on the left side.     Heart sounds: Normal heart sounds. No murmur heard. Pulmonary:     Effort: Pulmonary effort is normal. No respiratory distress.     Breath sounds: Normal breath sounds.  Abdominal:     General: Abdomen is flat. Bowel sounds are normal. There is no distension.     Palpations: Abdomen is soft. There is no hepatomegaly, splenomegaly or mass.     Tenderness: There is no abdominal tenderness. There is no right CVA tenderness, left CVA tenderness, guarding or rebound.  Musculoskeletal:        General: Normal range of motion.     Cervical back: Full passive range of motion without pain, normal range of motion and neck supple. No tenderness.     Right lower leg: No edema.     Left lower leg: No edema.  Feet:     Left foot:  Toenail Condition: Left toenails are normal.  Lymphadenopathy:     Cervical: No cervical adenopathy.     Upper Body:     Right upper body: No supraclavicular adenopathy.     Left upper body: No supraclavicular adenopathy.  Skin:    General: Skin is warm and dry.     Capillary Refill: Capillary refill takes less than 2 seconds.     Nails: There is no clubbing.  Neurological:     General: No focal deficit present.     Mental Status: She is alert and oriented to person, place, and time.     GCS: GCS eye subscore is 4. GCS verbal  subscore is 5. GCS motor subscore is 6.     Sensory: Sensation is intact. No sensory deficit.     Motor: Motor function is intact. No weakness.     Coordination: Coordination is intact. Coordination normal.     Gait: Gait is intact. Gait normal.     Deep Tendon Reflexes: Reflexes are normal and symmetric.  Psychiatric:        Attention and Perception: Attention normal.        Mood and Affect: Mood normal.        Speech: Speech normal.        Behavior: Behavior normal. Behavior is cooperative.        Thought Content: Thought content normal.        Cognition and Memory: Cognition and memory normal.        Judgment: Judgment normal.      Results for orders placed or performed in visit on 05/27/24  Hemoglobin A1c   Collection Time: 05/27/24  2:07 PM  Result Value Ref Range   Hgb A1c MFr Bld 7.7 (H) 4.8 - 5.6 %   Est. average glucose Bld gHb Est-mCnc 174 mg/dL  CBC with Differential/Platelet   Collection Time: 05/27/24  2:07 PM  Result Value Ref Range   WBC 3.7 3.4 - 10.8 x10E3/uL   RBC 4.53 3.77 - 5.28 x10E6/uL   Hemoglobin 13.0 11.1 - 15.9 g/dL   Hematocrit 59.8 65.9 - 46.6 %   MCV 89 79 - 97 fL   MCH 28.7 26.6 - 33.0 pg   MCHC 32.4 31.5 - 35.7 g/dL   RDW 86.7 88.2 - 84.5 %   Platelets 236 150 - 450 x10E3/uL   Neutrophils 36 Not Estab. %   Lymphs 42 Not Estab. %   Monocytes 10 Not Estab. %   Eos 11 Not Estab. %   Basos 1 Not Estab. %   Neutrophils Absolute 1.3 (L) 1.4 - 7.0 x10E3/uL   Lymphocytes Absolute 1.6 0.7 - 3.1 x10E3/uL   Monocytes Absolute 0.4 0.1 - 0.9 x10E3/uL   EOS (ABSOLUTE) 0.4 0.0 - 0.4 x10E3/uL   Basophils Absolute 0.0 0.0 - 0.2 x10E3/uL   Immature Granulocytes 0 Not Estab. %   Immature Grans (Abs) 0.0 0.0 - 0.1 x10E3/uL  Comprehensive metabolic panel with GFR   Collection Time: 05/27/24  2:07 PM  Result Value Ref Range   Glucose 122 (H) 70 - 99 mg/dL   BUN 13 8 - 27 mg/dL   Creatinine, Ser 9.42 0.57 - 1.00 mg/dL   eGFR 91 >40 fO/fpw/8.26    BUN/Creatinine Ratio 23 12 - 28   Sodium 141 134 - 144 mmol/L   Potassium 4.4 3.5 - 5.2 mmol/L   Chloride 102 96 - 106 mmol/L   CO2 21 20 - 29 mmol/L   Calcium 10.0 8.7 -  10.3 mg/dL   Total Protein 7.2 6.0 - 8.5 g/dL   Albumin 4.7 3.7 - 4.7 g/dL   Globulin, Total 2.5 1.5 - 4.5 g/dL   Bilirubin Total 0.3 0.0 - 1.2 mg/dL   Alkaline Phosphatase 78 48 - 129 IU/L   AST 26 0 - 40 IU/L   ALT 29 0 - 32 IU/L  TSH   Collection Time: 05/27/24  2:07 PM  Result Value Ref Range   TSH 0.451 0.450 - 4.500 uIU/mL  T4, free   Collection Time: 05/27/24  2:07 PM  Result Value Ref Range   Free T4 0.87 0.82 - 1.77 ng/dL  Lipid panel   Collection Time: 05/27/24  2:07 PM  Result Value Ref Range   Cholesterol, Total 192 100 - 199 mg/dL   Triglycerides 848 (H) 0 - 149 mg/dL   HDL 39 (L) >60 mg/dL   VLDL Cholesterol Cal 27 5 - 40 mg/dL   LDL Chol Calc (NIH) 873 (H) 0 - 99 mg/dL   Chol/HDL Ratio 4.9 (H) 0.0 - 4.4 ratio  Microalbumin/Creatinine Ratio, Urine   Collection Time: 05/27/24  2:07 PM  Result Value Ref Range   Creatinine, Urine 79.6 Not Estab. mg/dL   Microalbumin, Urine 59.9 Not Estab. 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 Managed with bexarotene with specialist.  - Continue bexarotene as prescribed      Relevant Medications   bexarotene (TARGRETIN) 75 MG CAPS capsule   Encounter for annual physical exam - Primary   CPE completed today. Review of HM activities and recommendations discussed and provided on AVS. Anticipatory guidance, diet, and exercise recommendations provided. Medications, allergies, and hx reviewed and updated as necessary. Orders placed as listed below.  Plan: - Labs ordered. Will make changes as necessary based on results.  - I will review these results and send  recommendations via MyChart or a telephone call.  - F/U with CPE in 1 year or sooner for acute/chronic health needs as directed.        Type 2 diabetes mellitus with other specified complication (HCC)   Blood sugar levels are slightly elevated, with recent readings of 140-170 mg/dL. Metformin previously caused nausea, but symptoms improved with reduced dosage. - Continue metformin once daily after supper - Monitor blood sugar levels at home - Consider adding additional therapy if not well controlled.       Relevant Medications   losartan  (COZAAR ) 25 MG tablet   Other Relevant Orders   Hemoglobin A1c (Completed)   CBC with Differential/Platelet (Completed)   Comprehensive metabolic panel with GFR (Completed)   Lipid panel (Completed)   Microalbumin/Creatinine Ratio, Urine (Completed)   Hypertension associated with diabetes (HCC)   Hypertension associated with diabetes. Current therapy: losartan  25mg . Prescribed daily. Current BP is controlled. is tolerating medication well. is managing schedule of medication. Lab monitoring is due and ordered today. - Monitor BP at home as directed or if you begin to have symptoms. If your blood pressure readings are consistently higher than 135/85 please let us  know.  - Low fat, low card diet with daily exercise strongly encouraged.  - Continue medication management daily.  - F/U DM 3 mo       Relevant Medications   losartan  (  COZAAR ) 25 MG tablet   Other Relevant Orders   Hemoglobin A1c (Completed)   CBC with Differential/Platelet (Completed)   Comprehensive metabolic panel with GFR (Completed)   Lipid panel (Completed)   Microalbumin/Creatinine Ratio, Urine (Completed)   DM type 2 with diabetic mixed hyperlipidemia (HCC)   Hyperlipidemia associated with diabetes. Current therapy: simvistatin (Zocor ) 20mg   Prescribed daily. Last lipids well-controlled. Tolerating statin well. Managing schedule of medication.  - Monitor lipid levels annually, or  more frequently if abnormal.  - Continue statin therapy at current dose and frequency - Low fat, low carb diet with daily exercise strongly encouraged.  - F/U DM 10mo       Relevant Medications   losartan  (COZAAR ) 25 MG tablet   Other Relevant Orders   Hemoglobin A1c (Completed)   CBC with Differential/Platelet (Completed)   Comprehensive metabolic panel with GFR (Completed)   Lipid panel (Completed)   Microalbumin/Creatinine Ratio, Urine (Completed)   Mycosis fungoides (HCC)   Followed with Dr. Flynn with [facility]. She previously was on methotrexate once a week, but she has stopped this. Currently she is on triamcinolone for 2 weeks then off for a week. She uses these on her dark spots.       Relevant Medications   bexarotene (TARGRETIN) 75 MG CAPS capsule   Other Relevant Orders   CBC with Differential/Platelet (Completed)   Comprehensive metabolic panel with GFR (Completed)   TSH (Completed)   T4, free (Completed)   Lipid panel (Completed)   Acquired hypothyroidism   Chronic. She has been on levothyroxine  50mcg once a day. Dr. Flynn with [facility] helps to manage this. We will send her labs to Dr. Flynn once the results come back.       Relevant Medications   levothyroxine  (SYNTHROID ) 50 MCG tablet   Other Relevant Orders   TSH (Completed)   T4, free (Completed)   Other Visit Diagnoses       Cold sore           Assessment and Plan Assessment & Plan       Follow up plan: Return in about 3 months (around 08/27/2024) for Med Management 30.  NEXT PREVENTATIVE PHYSICAL DUE IN 1 YEAR.  PATIENT COUNSELING PROVIDED FOR ALL ADULT PATIENTS: A well balanced diet low in saturated fats, cholesterol, and moderation in carbohydrates.  This can be as simple as monitoring portion sizes and cutting back on sugary beverages such as soda and juice to start with.    Daily water consumption of at least 64 ounces.  Physical activity at least 180 minutes per  week.  If just starting out, start 10 minutes a day and work your way up.   This can be as simple as taking the stairs instead of the elevator and walking 2-3 laps around the office  purposefully every day.   STD protection, partner selection, and regular testing if high risk.  Limited consumption of alcoholic beverages if alcohol is consumed. For men, I recommend no more than 14 alcoholic beverages per week, spread out throughout the week (max 2 per day). Avoid binge drinking or consuming large quantities of alcohol in one setting.  Please let me know if you feel you may need help with reduction or quitting alcohol consumption.   Avoidance of nicotine, if used. Please let me know if you feel you may need help with reduction or quitting nicotine use.   Daily mental health attention. This can be in the form of 5 minute  daily meditation, prayer, journaling, yoga, reflection, etc.  Purposeful attention to your emotions and mental state can significantly improve your overall wellbeing  and  Health.  Please know that I am here to help you with all of your health care goals and am happy to work with you to find a solution that works best for you.  The greatest advice I have received with any changes in life are to take it one step at a time, that even means if all you can focus on is the next 60 seconds, then do that and celebrate your victories.  With any changes in life, you will have set backs, and that is OK. The important thing to remember is, if you have a set back, it is not a failure, it is an opportunity to try again! Screening Testing Mammogram Every 1 -2 years based on history and risk factors Starting at age 83 Pap Smear Ages 21-39 every 3 years Ages 72-65 every 5 years with HPV testing More frequent testing may be required based on results and history Colon Cancer Screening Every 1-10 years based on test performed, risk factors, and history Starting at age 55 Bone Density  Screening Every 2-10 years based on history Starting at age 8 for women Recommendations for men differ based on medication usage, history, and risk factors AAA Screening One time ultrasound Men 75-89 years old who have every smoked Lung Cancer Screening Low Dose Lung CT every 12 months Age 56-80 years with a 30 pack-year smoking history who still smoke or who have quit within the last 15 years   Screening Labs Routine  Labs: Complete Blood Count (CBC), Complete Metabolic Panel (CMP), Cholesterol (Lipid Panel) Every 6-12 months based on history and medications May be recommended more frequently based on current conditions or previous results Hemoglobin A1c Lab Every 3-12 months based on history and previous results Starting at age 47 or earlier with diagnosis of diabetes, high cholesterol, BMI >26, and/or risk factors Frequent monitoring for patients with diabetes to ensure blood sugar control Thyroid Panel (TSH) Every 6 months based on history, symptoms, and risk factors May be repeated more often if on medication HIV One time testing for all patients 76 and older May be repeated more frequently for patients with increased risk factors or exposure Hepatitis C One time testing for all patients 39 and older May be repeated more frequently for patients with increased risk factors or exposure Gonorrhea, Chlamydia Every 12 months for all sexually active persons 13-24 years Additional monitoring may be recommended for those who are considered high risk or who have symptoms Every 12 months for any woman on birth control, regardless of sexual activity PSA Men 35-76 years old with risk factors Additional screening may be recommended from age 38-69 based on risk factors, symptoms, and history  Vaccine Recommendations Tetanus Booster All adults every 10 years Flu Vaccine All patients 6 months and older every year COVID Vaccine All patients 12 years and older Initial dosing with  booster May recommend additional booster based on age and health history HPV Vaccine 2 doses all patients age 86-26 Dosing may be considered for patients over 26 Shingles Vaccine (Shingrix) 2 doses all adults 55 years and older Pneumonia (Pneumovax 59) All adults 65 years and older May recommend earlier dosing based on health history One year apart from Prevnar 67 Pneumonia (Prevnar 52) All adults 65 years and older Dosed 1 year after Pneumovax 23 Pneumonia (Prevnar 20) One time alternative to the two  dosing of 13 and 23 For all adults with initial dose of 23, 20 is recommended 1 year later For all adults with initial dose of 13, 23 is still recommended as second option 1 year later

## 2024-05-27 NOTE — Assessment & Plan Note (Signed)
 Hyperlipidemia associated with diabetes. Current therapy: simvistatin (Zocor ) 20mg   Prescribed daily. Last lipids well-controlled. Tolerating statin well. Managing schedule of medication.  - Monitor lipid levels annually, or more frequently if abnormal.  - Continue statin therapy at current dose and frequency - Low fat, low carb diet with daily exercise strongly encouraged.  - F/U DM 56mo

## 2024-05-27 NOTE — Assessment & Plan Note (Signed)
 Hypertension associated with diabetes. Current therapy: losartan  25mg . Prescribed daily. Current BP is controlled. is tolerating medication well. is managing schedule of medication. Lab monitoring is due and ordered today. - Monitor BP at home as directed or if you begin to have symptoms. If your blood pressure readings are consistently higher than 135/85 please let us  know.  - Low fat, low card diet with daily exercise strongly encouraged.  - Continue medication management daily.  - F/U DM 3 mo

## 2024-05-27 NOTE — Assessment & Plan Note (Signed)
 Followed with Dr. Flynn with [facility]. She previously was on methotrexate once a week, but she has stopped this. Currently she is on triamcinolone for 2 weeks then off for a week. She uses these on her dark spots.

## 2024-05-28 LAB — CBC WITH DIFFERENTIAL/PLATELET
Basophils Absolute: 0 x10E3/uL (ref 0.0–0.2)
Basos: 1 %
EOS (ABSOLUTE): 0.4 x10E3/uL (ref 0.0–0.4)
Eos: 11 %
Hematocrit: 40.1 % (ref 34.0–46.6)
Hemoglobin: 13 g/dL (ref 11.1–15.9)
Immature Grans (Abs): 0 x10E3/uL (ref 0.0–0.1)
Immature Granulocytes: 0 %
Lymphocytes Absolute: 1.6 x10E3/uL (ref 0.7–3.1)
Lymphs: 42 %
MCH: 28.7 pg (ref 26.6–33.0)
MCHC: 32.4 g/dL (ref 31.5–35.7)
MCV: 89 fL (ref 79–97)
Monocytes Absolute: 0.4 x10E3/uL (ref 0.1–0.9)
Monocytes: 10 %
Neutrophils Absolute: 1.3 x10E3/uL — ABNORMAL LOW (ref 1.4–7.0)
Neutrophils: 36 %
Platelets: 236 x10E3/uL (ref 150–450)
RBC: 4.53 x10E6/uL (ref 3.77–5.28)
RDW: 13.2 % (ref 11.7–15.4)
WBC: 3.7 x10E3/uL (ref 3.4–10.8)

## 2024-05-28 LAB — COMPREHENSIVE METABOLIC PANEL WITH GFR
ALT: 29 IU/L (ref 0–32)
AST: 26 IU/L (ref 0–40)
Albumin: 4.7 g/dL (ref 3.7–4.7)
Alkaline Phosphatase: 78 IU/L (ref 48–129)
BUN/Creatinine Ratio: 23 (ref 12–28)
BUN: 13 mg/dL (ref 8–27)
Bilirubin Total: 0.3 mg/dL (ref 0.0–1.2)
CO2: 21 mmol/L (ref 20–29)
Calcium: 10 mg/dL (ref 8.7–10.3)
Chloride: 102 mmol/L (ref 96–106)
Creatinine, Ser: 0.57 mg/dL (ref 0.57–1.00)
Globulin, Total: 2.5 g/dL (ref 1.5–4.5)
Glucose: 122 mg/dL — AB (ref 70–99)
Potassium: 4.4 mmol/L (ref 3.5–5.2)
Sodium: 141 mmol/L (ref 134–144)
Total Protein: 7.2 g/dL (ref 6.0–8.5)
eGFR: 91 mL/min/1.73 (ref >59)

## 2024-05-28 LAB — LIPID PANEL
Cholesterol, Total: 192 mg/dL (ref 100–199)
HDL: 39 mg/dL — AB (ref >39)
LDL CALC COMMENT:: 4.9 ratio — AB (ref 0.0–4.4)
LDL Chol Calc (NIH): 126 mg/dL — AB (ref 0–99)
Triglycerides: 151 mg/dL — AB (ref 0–149)
VLDL Cholesterol Cal: 27 mg/dL (ref 5–40)

## 2024-05-28 LAB — MICROALBUMIN / CREATININE URINE RATIO
Creatinine, Urine: 79.6 mg/dL
Microalb/Creat Ratio: 50 mg/g{creat} — AB (ref 0–29)
Microalbumin, Urine: 40 ug/mL

## 2024-05-28 LAB — T4, FREE: Free T4: 0.87 ng/dL (ref 0.82–1.77)

## 2024-05-28 LAB — HEMOGLOBIN A1C
Est. average glucose Bld gHb Est-mCnc: 174 mg/dL
Hgb A1c MFr Bld: 7.7 % — ABNORMAL HIGH (ref 4.8–5.6)

## 2024-05-28 LAB — TSH: TSH: 0.451 u[IU]/mL (ref 0.450–4.500)

## 2024-06-02 ENCOUNTER — Ambulatory Visit: Payer: Self-pay | Admitting: Nurse Practitioner

## 2024-06-02 DIAGNOSIS — E1169 Type 2 diabetes mellitus with other specified complication: Secondary | ICD-10-CM

## 2024-06-02 MED ORDER — METFORMIN HCL ER (MOD) 1000 MG PO TB24: 1000.0000 mg | ORAL_TABLET | Freq: Every day | ORAL | 1 refills | Status: AC

## 2024-06-02 MED ORDER — SIMVASTATIN 40 MG PO TABS
40.0000 mg | ORAL_TABLET | Freq: Every day | ORAL | 1 refills | Status: AC
Start: 2024-06-02 — End: ?

## 2024-06-02 NOTE — Assessment & Plan Note (Signed)
 No current issues with lymph nodes. Managed with bexarotene with specialist.  - Continue bexarotene as prescribed

## 2024-06-02 NOTE — Assessment & Plan Note (Signed)
 Blood sugar levels are slightly elevated, with recent readings of 140-170 mg/dL. Metformin previously caused nausea, but symptoms improved with reduced dosage. - Continue metformin once daily after supper - Monitor blood sugar levels at home - Consider adding additional therapy if not well controlled.

## 2024-06-02 NOTE — Assessment & Plan Note (Signed)

## 2024-06-16 ENCOUNTER — Ambulatory Visit: Payer: Medicare PPO | Admitting: Nurse Practitioner

## 2024-08-22 ENCOUNTER — Other Ambulatory Visit (HOSPITAL_COMMUNITY): Payer: Self-pay

## 2024-08-27 NOTE — Progress Notes (Unsigned)
 Pneumonia Vaccine {SEHMDOC:34218}  [name], DNP, AGNP-c [facility]  [address] [place], [place] [address] [phone]   ESTABLISHED PATIENT- Chronic Health and/or Follow-Up Visit on 08/29/2024  There were no vitals taken for this visit.   Subjective:  No chief complaint on file.   *** ROS negative except for what is listed in HPI. History, Medications, Surgery, SDOH, and Family History reviewed and updated as appropriate.  Objective:  Physical Exam      Assessment & Plan:   Assessment & Plan    Camie FORBES Doing, DNP, AGNP-c  {SETIMEYorN (Optional):34216}

## 2024-08-29 ENCOUNTER — Ambulatory Visit: Admitting: Nurse Practitioner

## 2024-08-29 ENCOUNTER — Encounter: Payer: Self-pay | Admitting: Nurse Practitioner

## 2024-08-29 VITALS — BP 124/88 | HR 95 | Wt 138.0 lb

## 2024-08-29 DIAGNOSIS — I152 Hypertension secondary to endocrine disorders: Secondary | ICD-10-CM

## 2024-08-29 DIAGNOSIS — E039 Hypothyroidism, unspecified: Secondary | ICD-10-CM

## 2024-08-29 DIAGNOSIS — C8408 Mycosis fungoides, lymph nodes of multiple sites: Secondary | ICD-10-CM

## 2024-08-29 DIAGNOSIS — E1169 Type 2 diabetes mellitus with other specified complication: Secondary | ICD-10-CM

## 2024-08-29 DIAGNOSIS — C8104 Nodular lymphocyte predominant Hodgkin lymphoma, lymph nodes of axilla and upper limb: Secondary | ICD-10-CM

## 2024-08-29 NOTE — Assessment & Plan Note (Addendum)
 Seeing Dr. Flynn at [facility] for management. Attending dermatology appointment in March. No concerns or symptoms are present at this time. Labs reviewed today, which are all normal and reassuring. Continue to monitor.

## 2024-08-29 NOTE — Assessment & Plan Note (Addendum)
 Type 2 diabetes mellitus with recent increase in A1c from 7.4 to 7.7. A1c today better at 7.5%. Blood glucose levels at home are variable, with recent readings of 120 and 150 mg/dL. Foot exam normal.  - Continue metformin  extended release 1000 mg daily with supper. - Continue losartan  25 mg daily. - Continue simvastatin  40 mg daily. - Monitor blood glucose levels at home regularly.

## 2024-08-29 NOTE — Assessment & Plan Note (Addendum)
 Thyroid  function tests are within normal limits. Viewed in care everywhere under [facility] note. No changes in the plan of care.  - Continue Synthroid  50 mcg daily.

## 2024-08-29 NOTE — Patient Instructions (Signed)
 Your A1c was 7.5 today!! Looks better!! Keep up the great work.

## 2025-01-06 ENCOUNTER — Ambulatory Visit: Admitting: Nurse Practitioner

## 2025-06-05 ENCOUNTER — Encounter: Admitting: Nurse Practitioner
